# Patient Record
Sex: Female | Born: 1955 | Race: Black or African American | Hispanic: No | Smoking: Former smoker
Health system: Southern US, Community
[De-identification: ages and names within clinical notes are randomized; demographics above are authoritative.]

## PROBLEM LIST (undated history)

## (undated) DIAGNOSIS — F329 Major depressive disorder, single episode, unspecified: Secondary | ICD-10-CM

## (undated) DIAGNOSIS — F32A Depression, unspecified: Secondary | ICD-10-CM

## (undated) DIAGNOSIS — K311 Adult hypertrophic pyloric stenosis: Secondary | ICD-10-CM

## (undated) DIAGNOSIS — E785 Hyperlipidemia, unspecified: Secondary | ICD-10-CM

## (undated) DIAGNOSIS — K219 Gastro-esophageal reflux disease without esophagitis: Secondary | ICD-10-CM

## (undated) DIAGNOSIS — M5030 Other cervical disc degeneration, unspecified cervical region: Secondary | ICD-10-CM

## (undated) DIAGNOSIS — Z720 Tobacco use: Secondary | ICD-10-CM

## (undated) DIAGNOSIS — I10 Essential (primary) hypertension: Secondary | ICD-10-CM

## (undated) DIAGNOSIS — IMO0002 Reserved for concepts with insufficient information to code with codable children: Secondary | ICD-10-CM

## (undated) DIAGNOSIS — Z974 Presence of external hearing-aid: Secondary | ICD-10-CM

## (undated) HISTORY — DX: Major depressive disorder, single episode, unspecified: F32.9

## (undated) HISTORY — DX: Reserved for concepts with insufficient information to code with codable children: IMO0002

## (undated) HISTORY — DX: Tobacco use: Z72.0

## (undated) HISTORY — DX: Essential (primary) hypertension: I10

## (undated) HISTORY — DX: Presence of external hearing-aid: Z97.4

## (undated) HISTORY — DX: Adult hypertrophic pyloric stenosis: K31.1

## (undated) HISTORY — DX: Depression, unspecified: F32.A

## (undated) HISTORY — DX: Gastro-esophageal reflux disease without esophagitis: K21.9

## (undated) HISTORY — PX: LUMBAR LAMINECTOMY: SHX95

## (undated) HISTORY — DX: Other cervical disc degeneration, unspecified cervical region: M50.30

## (undated) HISTORY — DX: Hyperlipidemia, unspecified: E78.5

---

## 1991-04-25 HISTORY — PX: LUMBAR LAMINECTOMY: SHX95

## 1998-06-14 ENCOUNTER — Encounter: Payer: Self-pay | Admitting: Family Medicine

## 1998-06-14 ENCOUNTER — Ambulatory Visit (HOSPITAL_COMMUNITY): Admission: RE | Admit: 1998-06-14 | Discharge: 1998-06-14 | Payer: Self-pay | Admitting: Family Medicine

## 1998-09-16 ENCOUNTER — Emergency Department (HOSPITAL_COMMUNITY): Admission: EM | Admit: 1998-09-16 | Discharge: 1998-09-16 | Payer: Self-pay | Admitting: Emergency Medicine

## 1998-09-16 ENCOUNTER — Encounter: Payer: Self-pay | Admitting: Emergency Medicine

## 1999-05-27 ENCOUNTER — Emergency Department (HOSPITAL_COMMUNITY): Admission: EM | Admit: 1999-05-27 | Discharge: 1999-05-27 | Payer: Self-pay | Admitting: Emergency Medicine

## 1999-08-19 ENCOUNTER — Ambulatory Visit (HOSPITAL_COMMUNITY): Admission: RE | Admit: 1999-08-19 | Discharge: 1999-08-19 | Payer: Self-pay | Admitting: Family Medicine

## 2000-08-23 ENCOUNTER — Emergency Department (HOSPITAL_COMMUNITY): Admission: EM | Admit: 2000-08-23 | Discharge: 2000-08-23 | Payer: Self-pay | Admitting: Emergency Medicine

## 2000-08-24 ENCOUNTER — Emergency Department (HOSPITAL_COMMUNITY): Admission: EM | Admit: 2000-08-24 | Discharge: 2000-08-24 | Payer: Self-pay | Admitting: Emergency Medicine

## 2000-08-24 ENCOUNTER — Encounter: Payer: Self-pay | Admitting: Emergency Medicine

## 2003-02-04 ENCOUNTER — Emergency Department (HOSPITAL_COMMUNITY): Admission: EM | Admit: 2003-02-04 | Discharge: 2003-02-04 | Payer: Self-pay | Admitting: Emergency Medicine

## 2003-02-20 ENCOUNTER — Encounter: Admission: RE | Admit: 2003-02-20 | Discharge: 2003-02-20 | Payer: Self-pay | Admitting: Internal Medicine

## 2003-02-26 ENCOUNTER — Encounter: Admission: RE | Admit: 2003-02-26 | Discharge: 2003-02-26 | Payer: Self-pay | Admitting: Internal Medicine

## 2003-03-02 ENCOUNTER — Ambulatory Visit (HOSPITAL_COMMUNITY): Admission: RE | Admit: 2003-03-02 | Discharge: 2003-03-02 | Payer: Self-pay | Admitting: Internal Medicine

## 2003-03-12 ENCOUNTER — Encounter: Admission: RE | Admit: 2003-03-12 | Discharge: 2003-03-12 | Payer: Self-pay | Admitting: Internal Medicine

## 2003-04-29 ENCOUNTER — Encounter: Admission: RE | Admit: 2003-04-29 | Discharge: 2003-04-29 | Payer: Self-pay | Admitting: Internal Medicine

## 2003-05-04 ENCOUNTER — Emergency Department (HOSPITAL_COMMUNITY): Admission: EM | Admit: 2003-05-04 | Discharge: 2003-05-04 | Payer: Self-pay | Admitting: Emergency Medicine

## 2003-05-07 ENCOUNTER — Encounter: Admission: RE | Admit: 2003-05-07 | Discharge: 2003-05-07 | Payer: Self-pay | Admitting: Internal Medicine

## 2003-06-02 ENCOUNTER — Encounter: Admission: RE | Admit: 2003-06-02 | Discharge: 2003-06-02 | Payer: Self-pay | Admitting: Orthopedic Surgery

## 2003-06-17 ENCOUNTER — Encounter: Admission: RE | Admit: 2003-06-17 | Discharge: 2003-06-17 | Payer: Self-pay | Admitting: Orthopedic Surgery

## 2003-06-17 ENCOUNTER — Encounter: Admission: RE | Admit: 2003-06-17 | Discharge: 2003-06-17 | Payer: Self-pay | Admitting: Internal Medicine

## 2003-07-02 ENCOUNTER — Encounter: Admission: RE | Admit: 2003-07-02 | Discharge: 2003-07-02 | Payer: Self-pay | Admitting: Orthopedic Surgery

## 2003-07-08 ENCOUNTER — Encounter: Admission: RE | Admit: 2003-07-08 | Discharge: 2003-07-08 | Payer: Self-pay | Admitting: Internal Medicine

## 2003-07-14 ENCOUNTER — Encounter: Admission: RE | Admit: 2003-07-14 | Discharge: 2003-07-14 | Payer: Self-pay | Admitting: Internal Medicine

## 2003-07-14 ENCOUNTER — Ambulatory Visit (HOSPITAL_COMMUNITY): Admission: RE | Admit: 2003-07-14 | Discharge: 2003-07-14 | Payer: Self-pay | Admitting: Internal Medicine

## 2003-07-24 ENCOUNTER — Ambulatory Visit (HOSPITAL_COMMUNITY): Admission: RE | Admit: 2003-07-24 | Discharge: 2003-07-24 | Payer: Self-pay | Admitting: Internal Medicine

## 2003-07-29 ENCOUNTER — Encounter: Admission: RE | Admit: 2003-07-29 | Discharge: 2003-07-29 | Payer: Self-pay | Admitting: Orthopedic Surgery

## 2003-08-13 ENCOUNTER — Encounter: Admission: RE | Admit: 2003-08-13 | Discharge: 2003-08-13 | Payer: Self-pay | Admitting: Internal Medicine

## 2003-10-05 ENCOUNTER — Ambulatory Visit (HOSPITAL_COMMUNITY): Admission: RE | Admit: 2003-10-05 | Discharge: 2003-10-05 | Payer: Self-pay | Admitting: *Deleted

## 2004-01-28 ENCOUNTER — Ambulatory Visit: Payer: Self-pay | Admitting: Internal Medicine

## 2004-02-01 ENCOUNTER — Ambulatory Visit: Payer: Self-pay | Admitting: Internal Medicine

## 2004-02-05 ENCOUNTER — Ambulatory Visit (HOSPITAL_COMMUNITY): Admission: RE | Admit: 2004-02-05 | Discharge: 2004-02-05 | Payer: Self-pay | Admitting: Family Medicine

## 2004-02-08 ENCOUNTER — Ambulatory Visit: Payer: Self-pay | Admitting: Internal Medicine

## 2004-02-17 ENCOUNTER — Ambulatory Visit: Payer: Self-pay | Admitting: Internal Medicine

## 2004-03-24 ENCOUNTER — Encounter: Admission: RE | Admit: 2004-03-24 | Discharge: 2004-06-09 | Payer: Self-pay | Admitting: Psychiatry

## 2004-04-14 ENCOUNTER — Encounter: Admission: RE | Admit: 2004-04-14 | Discharge: 2004-07-13 | Payer: Self-pay | Admitting: Psychiatry

## 2004-05-09 ENCOUNTER — Emergency Department (HOSPITAL_COMMUNITY): Admission: EM | Admit: 2004-05-09 | Discharge: 2004-05-09 | Payer: Self-pay | Admitting: Emergency Medicine

## 2004-05-12 ENCOUNTER — Emergency Department (HOSPITAL_COMMUNITY): Admission: EM | Admit: 2004-05-12 | Discharge: 2004-05-12 | Payer: Self-pay | Admitting: Emergency Medicine

## 2004-05-20 ENCOUNTER — Ambulatory Visit: Payer: Self-pay | Admitting: Internal Medicine

## 2004-06-22 ENCOUNTER — Ambulatory Visit: Payer: Self-pay | Admitting: Internal Medicine

## 2004-12-28 ENCOUNTER — Ambulatory Visit: Payer: Self-pay | Admitting: Internal Medicine

## 2005-02-20 ENCOUNTER — Ambulatory Visit: Payer: Self-pay | Admitting: Hospitalist

## 2005-03-07 ENCOUNTER — Ambulatory Visit: Payer: Self-pay | Admitting: Internal Medicine

## 2006-02-12 ENCOUNTER — Ambulatory Visit: Payer: Self-pay | Admitting: Internal Medicine

## 2006-02-12 LAB — CONVERTED CEMR LAB
BUN: 12 mg/dL (ref 6–23)
CO2: 21 meq/L (ref 19–32)
Calcium: 9.8 mg/dL (ref 8.4–10.5)
Chloride: 106 meq/L (ref 96–112)
Creatinine, Ser: 0.83 mg/dL (ref 0.40–1.20)
Glucose, Bld: 72 mg/dL (ref 70–99)
Potassium: 4.2 meq/L (ref 3.5–5.3)
Sodium: 142 meq/L (ref 135–145)

## 2006-02-26 ENCOUNTER — Ambulatory Visit: Payer: Self-pay | Admitting: Hospitalist

## 2006-03-02 ENCOUNTER — Ambulatory Visit: Payer: Self-pay | Admitting: Internal Medicine

## 2006-03-02 ENCOUNTER — Encounter (INDEPENDENT_AMBULATORY_CARE_PROVIDER_SITE_OTHER): Payer: Self-pay | Admitting: *Deleted

## 2006-03-02 LAB — CONVERTED CEMR LAB
Cholesterol: 197 mg/dL (ref 0–200)
HDL: 26 mg/dL — ABNORMAL LOW (ref >39)
LDL Cholesterol: 139 mg/dL — ABNORMAL HIGH (ref 0–99)
Total CHOL/HDL Ratio: 7.6
Triglycerides: 162 mg/dL — ABNORMAL HIGH (ref <150)
VLDL: 32 mg/dL (ref 0–40)

## 2006-03-22 ENCOUNTER — Encounter (INDEPENDENT_AMBULATORY_CARE_PROVIDER_SITE_OTHER): Payer: Self-pay | Admitting: *Deleted

## 2006-03-22 ENCOUNTER — Ambulatory Visit: Payer: Self-pay | Admitting: Internal Medicine

## 2006-03-22 LAB — CONVERTED CEMR LAB
ALT: 12 units/L (ref 0–35)
AST: 14 units/L (ref 0–37)
Albumin: 4.6 g/dL (ref 3.5–5.2)
Alkaline Phosphatase: 90 units/L (ref 39–117)
BUN: 13 mg/dL (ref 6–23)
CO2: 26 meq/L (ref 19–32)
Calcium: 9.3 mg/dL (ref 8.4–10.5)
Chloride: 100 meq/L (ref 96–112)
Cholesterol: 234 mg/dL — ABNORMAL HIGH (ref 0–200)
Creatinine, Ser: 1.13 mg/dL (ref 0.40–1.20)
Glucose, Bld: 86 mg/dL (ref 70–99)
HDL: 28 mg/dL — ABNORMAL LOW (ref >39)
LDL Cholesterol: 142 mg/dL — ABNORMAL HIGH (ref 0–99)
Potassium: 4.1 meq/L (ref 3.5–5.3)
Sodium: 139 meq/L (ref 135–145)
Total Bilirubin: 0.2 mg/dL — ABNORMAL LOW (ref 0.3–1.2)
Total CHOL/HDL Ratio: 8.4
Total Protein: 7.5 g/dL (ref 6.0–8.3)
Triglycerides: 321 mg/dL — ABNORMAL HIGH (ref <150)
VLDL: 64 mg/dL — ABNORMAL HIGH (ref 0–40)

## 2006-04-10 ENCOUNTER — Encounter: Admission: RE | Admit: 2006-04-10 | Discharge: 2006-05-08 | Payer: Self-pay | Admitting: *Deleted

## 2006-04-12 ENCOUNTER — Ambulatory Visit (HOSPITAL_COMMUNITY): Admission: RE | Admit: 2006-04-12 | Discharge: 2006-04-12 | Payer: Self-pay | Admitting: Internal Medicine

## 2006-04-18 ENCOUNTER — Emergency Department (HOSPITAL_COMMUNITY): Admission: EM | Admit: 2006-04-18 | Discharge: 2006-04-18 | Payer: Self-pay | Admitting: Emergency Medicine

## 2006-04-22 ENCOUNTER — Emergency Department (HOSPITAL_COMMUNITY): Admission: EM | Admit: 2006-04-22 | Discharge: 2006-04-22 | Payer: Self-pay | Admitting: Emergency Medicine

## 2006-05-04 DIAGNOSIS — L219 Seborrheic dermatitis, unspecified: Secondary | ICD-10-CM | POA: Insufficient documentation

## 2006-05-04 DIAGNOSIS — M545 Low back pain, unspecified: Secondary | ICD-10-CM | POA: Insufficient documentation

## 2006-05-04 DIAGNOSIS — E785 Hyperlipidemia, unspecified: Secondary | ICD-10-CM | POA: Insufficient documentation

## 2006-05-04 DIAGNOSIS — IMO0002 Reserved for concepts with insufficient information to code with codable children: Secondary | ICD-10-CM | POA: Insufficient documentation

## 2006-05-04 DIAGNOSIS — I10 Essential (primary) hypertension: Secondary | ICD-10-CM | POA: Insufficient documentation

## 2006-05-04 DIAGNOSIS — F17200 Nicotine dependence, unspecified, uncomplicated: Secondary | ICD-10-CM | POA: Insufficient documentation

## 2006-05-04 DIAGNOSIS — F329 Major depressive disorder, single episode, unspecified: Secondary | ICD-10-CM

## 2006-05-04 DIAGNOSIS — G8929 Other chronic pain: Secondary | ICD-10-CM | POA: Insufficient documentation

## 2006-05-04 DIAGNOSIS — K219 Gastro-esophageal reflux disease without esophagitis: Secondary | ICD-10-CM | POA: Insufficient documentation

## 2006-05-04 DIAGNOSIS — F32A Depression, unspecified: Secondary | ICD-10-CM | POA: Insufficient documentation

## 2006-05-09 ENCOUNTER — Encounter: Admission: RE | Admit: 2006-05-09 | Discharge: 2006-07-20 | Payer: Self-pay | Admitting: *Deleted

## 2006-06-20 ENCOUNTER — Encounter (INDEPENDENT_AMBULATORY_CARE_PROVIDER_SITE_OTHER): Payer: Self-pay | Admitting: *Deleted

## 2006-06-20 ENCOUNTER — Ambulatory Visit: Payer: Self-pay | Admitting: Internal Medicine

## 2006-06-20 DIAGNOSIS — H6091 Unspecified otitis externa, right ear: Secondary | ICD-10-CM | POA: Insufficient documentation

## 2006-06-20 DIAGNOSIS — H6090 Unspecified otitis externa, unspecified ear: Secondary | ICD-10-CM

## 2006-06-20 HISTORY — DX: Unspecified otitis externa, right ear: H60.91

## 2006-06-20 LAB — CONVERTED CEMR LAB: Pap Smear: NORMAL

## 2006-06-21 LAB — CONVERTED CEMR LAB
Candida species: POSITIVE — AB
Gardnerella vaginalis: NEGATIVE
Trichomonal Vaginitis: NEGATIVE

## 2006-06-27 ENCOUNTER — Ambulatory Visit: Payer: Self-pay | Admitting: *Deleted

## 2006-06-27 ENCOUNTER — Encounter (INDEPENDENT_AMBULATORY_CARE_PROVIDER_SITE_OTHER): Payer: Self-pay | Admitting: *Deleted

## 2006-07-04 ENCOUNTER — Ambulatory Visit: Payer: Self-pay | Admitting: *Deleted

## 2006-07-11 LAB — CONVERTED CEMR LAB
ALT: 12 units/L (ref 0–35)
AST: 14 units/L (ref 0–37)
Albumin: 4.5 g/dL (ref 3.5–5.2)
Alkaline Phosphatase: 73 units/L (ref 39–117)
BUN: 12 mg/dL (ref 6–23)
CO2: 24 meq/L (ref 19–32)
Calcium: 9.7 mg/dL (ref 8.4–10.5)
Chloride: 107 meq/L (ref 96–112)
Cholesterol: 217 mg/dL — ABNORMAL HIGH (ref 0–200)
Creatinine, Ser: 0.8 mg/dL (ref 0.40–1.20)
Glucose, Bld: 90 mg/dL (ref 70–99)
HDL: 37 mg/dL — ABNORMAL LOW (ref >39)
Helicobacter Pylori Antibody-IgG: >8 — ABNORMAL HIGH
LDL Cholesterol: 158 mg/dL — ABNORMAL HIGH (ref 0–99)
Potassium: 4 meq/L (ref 3.5–5.3)
Sodium: 140 meq/L (ref 135–145)
Total Bilirubin: 0.3 mg/dL (ref 0.3–1.2)
Total CHOL/HDL Ratio: 5.9
Total Protein: 7.7 g/dL (ref 6.0–8.3)
Triglycerides: 108 mg/dL (ref <150)
VLDL: 22 mg/dL (ref 0–40)

## 2006-08-29 ENCOUNTER — Encounter: Payer: Self-pay | Admitting: Licensed Clinical Social Worker

## 2006-09-04 ENCOUNTER — Encounter (INDEPENDENT_AMBULATORY_CARE_PROVIDER_SITE_OTHER): Payer: Self-pay | Admitting: *Deleted

## 2006-10-04 ENCOUNTER — Encounter (INDEPENDENT_AMBULATORY_CARE_PROVIDER_SITE_OTHER): Payer: Self-pay | Admitting: *Deleted

## 2006-10-17 ENCOUNTER — Telehealth: Payer: Self-pay | Admitting: *Deleted

## 2006-10-17 ENCOUNTER — Ambulatory Visit: Payer: Self-pay | Admitting: Internal Medicine

## 2006-10-29 ENCOUNTER — Telehealth (INDEPENDENT_AMBULATORY_CARE_PROVIDER_SITE_OTHER): Payer: Self-pay | Admitting: *Deleted

## 2007-01-30 ENCOUNTER — Ambulatory Visit: Payer: Self-pay | Admitting: Infectious Diseases

## 2007-03-25 ENCOUNTER — Telehealth: Payer: Self-pay | Admitting: *Deleted

## 2007-03-26 ENCOUNTER — Telehealth (INDEPENDENT_AMBULATORY_CARE_PROVIDER_SITE_OTHER): Payer: Self-pay | Admitting: *Deleted

## 2007-04-10 ENCOUNTER — Ambulatory Visit (HOSPITAL_COMMUNITY): Admission: RE | Admit: 2007-04-10 | Discharge: 2007-04-10 | Payer: Self-pay | Admitting: Internal Medicine

## 2007-04-23 ENCOUNTER — Encounter: Admission: RE | Admit: 2007-04-23 | Discharge: 2007-04-23 | Payer: Self-pay | Admitting: Internal Medicine

## 2007-04-25 HISTORY — PX: OTHER SURGICAL HISTORY: SHX169

## 2007-04-30 ENCOUNTER — Encounter (INDEPENDENT_AMBULATORY_CARE_PROVIDER_SITE_OTHER): Payer: Self-pay | Admitting: Internal Medicine

## 2007-04-30 ENCOUNTER — Ambulatory Visit: Payer: Self-pay | Admitting: Internal Medicine

## 2007-05-01 LAB — CONVERTED CEMR LAB
ALT: 9 units/L (ref 0–35)
AST: 12 units/L (ref 0–37)
Albumin: 4.6 g/dL (ref 3.5–5.2)
Alkaline Phosphatase: 81 units/L (ref 39–117)
BUN: 10 mg/dL (ref 6–23)
CO2: 21 meq/L (ref 19–32)
Calcium: 9.3 mg/dL (ref 8.4–10.5)
Chloride: 111 meq/L (ref 96–112)
Cholesterol: 224 mg/dL — ABNORMAL HIGH (ref 0–200)
Creatinine, Ser: 0.82 mg/dL (ref 0.40–1.20)
Glucose, Bld: 93 mg/dL (ref 70–99)
HDL: 32 mg/dL — ABNORMAL LOW (ref >39)
LDL Cholesterol: 167 mg/dL — ABNORMAL HIGH (ref 0–99)
Potassium: 4.1 meq/L (ref 3.5–5.3)
Sodium: 142 meq/L (ref 135–145)
Total Bilirubin: 0.3 mg/dL (ref 0.3–1.2)
Total CHOL/HDL Ratio: 7
Total Protein: 7.4 g/dL (ref 6.0–8.3)
Triglycerides: 127 mg/dL (ref <150)
VLDL: 25 mg/dL (ref 0–40)

## 2007-05-06 ENCOUNTER — Inpatient Hospital Stay (HOSPITAL_COMMUNITY): Admission: AD | Admit: 2007-05-06 | Discharge: 2007-05-10 | Payer: Self-pay | Admitting: Hospitalist

## 2007-05-06 ENCOUNTER — Encounter (INDEPENDENT_AMBULATORY_CARE_PROVIDER_SITE_OTHER): Payer: Self-pay | Admitting: *Deleted

## 2007-05-06 ENCOUNTER — Ambulatory Visit: Payer: Self-pay | Admitting: Hospitalist

## 2007-05-06 ENCOUNTER — Ambulatory Visit: Payer: Self-pay | Admitting: Internal Medicine

## 2007-05-06 DIAGNOSIS — I498 Other specified cardiac arrhythmias: Secondary | ICD-10-CM | POA: Insufficient documentation

## 2007-05-09 ENCOUNTER — Encounter (INDEPENDENT_AMBULATORY_CARE_PROVIDER_SITE_OTHER): Payer: Self-pay | Admitting: Gastroenterology

## 2007-05-17 ENCOUNTER — Ambulatory Visit: Payer: Self-pay | Admitting: Infectious Disease

## 2007-05-17 ENCOUNTER — Encounter (INDEPENDENT_AMBULATORY_CARE_PROVIDER_SITE_OTHER): Payer: Self-pay | Admitting: Internal Medicine

## 2007-05-17 DIAGNOSIS — K5900 Constipation, unspecified: Secondary | ICD-10-CM | POA: Insufficient documentation

## 2007-05-20 LAB — CONVERTED CEMR LAB
BUN: 11 mg/dL (ref 6–23)
CO2: 27 meq/L (ref 19–32)
Calcium: 9.5 mg/dL (ref 8.4–10.5)
Chloride: 98 meq/L (ref 96–112)
Creatinine, Ser: 0.95 mg/dL (ref 0.40–1.20)
Glucose, Bld: 86 mg/dL (ref 70–99)
Potassium: 3.5 meq/L (ref 3.5–5.3)
Sodium: 140 meq/L (ref 135–145)

## 2007-05-21 ENCOUNTER — Encounter: Payer: Self-pay | Admitting: Licensed Clinical Social Worker

## 2007-05-24 ENCOUNTER — Encounter (INDEPENDENT_AMBULATORY_CARE_PROVIDER_SITE_OTHER): Payer: Self-pay | Admitting: *Deleted

## 2007-06-03 ENCOUNTER — Encounter (INDEPENDENT_AMBULATORY_CARE_PROVIDER_SITE_OTHER): Payer: Self-pay | Admitting: *Deleted

## 2007-06-04 ENCOUNTER — Encounter (INDEPENDENT_AMBULATORY_CARE_PROVIDER_SITE_OTHER): Payer: Self-pay | Admitting: *Deleted

## 2007-06-06 ENCOUNTER — Encounter: Admission: RE | Admit: 2007-06-06 | Discharge: 2007-06-06 | Payer: Self-pay | Admitting: Gastroenterology

## 2007-07-04 ENCOUNTER — Encounter (INDEPENDENT_AMBULATORY_CARE_PROVIDER_SITE_OTHER): Payer: Self-pay | Admitting: *Deleted

## 2007-07-04 DIAGNOSIS — Q400 Congenital hypertrophic pyloric stenosis: Secondary | ICD-10-CM | POA: Insufficient documentation

## 2007-07-08 ENCOUNTER — Encounter (INDEPENDENT_AMBULATORY_CARE_PROVIDER_SITE_OTHER): Payer: Self-pay | Admitting: *Deleted

## 2008-01-31 ENCOUNTER — Encounter (INDEPENDENT_AMBULATORY_CARE_PROVIDER_SITE_OTHER): Payer: Self-pay | Admitting: *Deleted

## 2008-01-31 ENCOUNTER — Ambulatory Visit: Payer: Self-pay | Admitting: Infectious Disease

## 2008-01-31 LAB — CONVERTED CEMR LAB
ALT: 11 units/L (ref 0–35)
AST: 14 units/L (ref 0–37)
Albumin: 4.6 g/dL (ref 3.5–5.2)
Alkaline Phosphatase: 95 units/L (ref 39–117)
BUN: 7 mg/dL (ref 6–23)
CO2: 23 meq/L (ref 19–32)
Calcium: 10.2 mg/dL (ref 8.4–10.5)
Chloride: 102 meq/L (ref 96–112)
Cholesterol: 143 mg/dL (ref 0–200)
Creatinine, Ser: 0.94 mg/dL (ref 0.40–1.20)
Glucose, Bld: 87 mg/dL (ref 70–99)
HDL: 32 mg/dL — ABNORMAL LOW (ref >39)
LDL Cholesterol: 85 mg/dL (ref 0–99)
Potassium: 3.4 meq/L — ABNORMAL LOW (ref 3.5–5.3)
Sodium: 139 meq/L (ref 135–145)
Total Bilirubin: 0.3 mg/dL (ref 0.3–1.2)
Total CHOL/HDL Ratio: 4.5
Total Protein: 7.7 g/dL (ref 6.0–8.3)
Triglycerides: 130 mg/dL (ref <150)
VLDL: 26 mg/dL (ref 0–40)

## 2008-02-04 ENCOUNTER — Telehealth: Payer: Self-pay | Admitting: *Deleted

## 2008-02-04 ENCOUNTER — Ambulatory Visit (HOSPITAL_COMMUNITY): Admission: RE | Admit: 2008-02-04 | Discharge: 2008-02-04 | Payer: Self-pay | Admitting: Infectious Disease

## 2008-02-07 DIAGNOSIS — M5030 Other cervical disc degeneration, unspecified cervical region: Secondary | ICD-10-CM | POA: Insufficient documentation

## 2008-03-11 ENCOUNTER — Encounter (INDEPENDENT_AMBULATORY_CARE_PROVIDER_SITE_OTHER): Payer: Self-pay | Admitting: Internal Medicine

## 2008-03-11 ENCOUNTER — Ambulatory Visit: Payer: Self-pay | Admitting: Infectious Diseases

## 2008-04-15 ENCOUNTER — Ambulatory Visit: Payer: Self-pay | Admitting: Internal Medicine

## 2008-05-07 ENCOUNTER — Ambulatory Visit (HOSPITAL_COMMUNITY): Admission: RE | Admit: 2008-05-07 | Discharge: 2008-05-07 | Payer: Self-pay | Admitting: Internal Medicine

## 2008-05-14 ENCOUNTER — Encounter (INDEPENDENT_AMBULATORY_CARE_PROVIDER_SITE_OTHER): Payer: Self-pay | Admitting: *Deleted

## 2008-06-02 ENCOUNTER — Encounter (INDEPENDENT_AMBULATORY_CARE_PROVIDER_SITE_OTHER): Payer: Self-pay | Admitting: *Deleted

## 2008-06-22 ENCOUNTER — Telehealth (INDEPENDENT_AMBULATORY_CARE_PROVIDER_SITE_OTHER): Payer: Self-pay | Admitting: *Deleted

## 2008-06-29 ENCOUNTER — Ambulatory Visit: Payer: Self-pay | Admitting: *Deleted

## 2008-06-29 ENCOUNTER — Encounter (INDEPENDENT_AMBULATORY_CARE_PROVIDER_SITE_OTHER): Payer: Self-pay | Admitting: *Deleted

## 2008-06-29 ENCOUNTER — Ambulatory Visit (HOSPITAL_COMMUNITY): Admission: RE | Admit: 2008-06-29 | Discharge: 2008-06-29 | Payer: Self-pay | Admitting: *Deleted

## 2008-08-23 ENCOUNTER — Emergency Department (HOSPITAL_COMMUNITY): Admission: EM | Admit: 2008-08-23 | Discharge: 2008-08-24 | Payer: Self-pay | Admitting: Emergency Medicine

## 2008-08-31 ENCOUNTER — Ambulatory Visit: Payer: Self-pay | Admitting: *Deleted

## 2008-08-31 ENCOUNTER — Encounter (INDEPENDENT_AMBULATORY_CARE_PROVIDER_SITE_OTHER): Payer: Self-pay | Admitting: *Deleted

## 2008-08-31 LAB — CONVERTED CEMR LAB
ALT: 12 units/L (ref 0–35)
AST: 13 units/L (ref 0–37)
Albumin: 4.1 g/dL (ref 3.5–5.2)
Alkaline Phosphatase: 77 units/L (ref 39–117)
BUN: 13 mg/dL (ref 6–23)
CO2: 20 meq/L (ref 19–32)
Calcium: 9.6 mg/dL (ref 8.4–10.5)
Chloride: 106 meq/L (ref 96–112)
Cholesterol: 136 mg/dL (ref 0–200)
Creatinine, Ser: 0.77 mg/dL (ref 0.40–1.20)
GFR calc Af Amer: >60 mL/min (ref >60)
GFR calc non Af Amer: >60 mL/min (ref >60)
Glucose, Bld: 86 mg/dL (ref 70–99)
HDL: 24 mg/dL — ABNORMAL LOW (ref >39)
LDL Cholesterol: 77 mg/dL (ref 0–99)
Potassium: 3.9 meq/L (ref 3.5–5.3)
Sodium: 139 meq/L (ref 135–145)
Total Bilirubin: 0.3 mg/dL (ref 0.3–1.2)
Total CHOL/HDL Ratio: 5.7
Total Protein: 6.9 g/dL (ref 6.0–8.3)
Triglycerides: 177 mg/dL — ABNORMAL HIGH (ref <150)
VLDL: 35 mg/dL (ref 0–40)

## 2008-09-01 ENCOUNTER — Encounter (INDEPENDENT_AMBULATORY_CARE_PROVIDER_SITE_OTHER): Payer: Self-pay | Admitting: *Deleted

## 2008-09-07 ENCOUNTER — Encounter: Payer: Self-pay | Admitting: Licensed Clinical Social Worker

## 2008-09-08 ENCOUNTER — Encounter (INDEPENDENT_AMBULATORY_CARE_PROVIDER_SITE_OTHER): Payer: Self-pay | Admitting: *Deleted

## 2008-09-10 ENCOUNTER — Encounter (INDEPENDENT_AMBULATORY_CARE_PROVIDER_SITE_OTHER): Payer: Self-pay | Admitting: *Deleted

## 2008-09-14 ENCOUNTER — Encounter (INDEPENDENT_AMBULATORY_CARE_PROVIDER_SITE_OTHER): Payer: Self-pay | Admitting: *Deleted

## 2008-09-22 ENCOUNTER — Encounter (INDEPENDENT_AMBULATORY_CARE_PROVIDER_SITE_OTHER): Payer: Self-pay | Admitting: *Deleted

## 2008-10-05 ENCOUNTER — Encounter (INDEPENDENT_AMBULATORY_CARE_PROVIDER_SITE_OTHER): Payer: Self-pay | Admitting: Internal Medicine

## 2008-10-21 ENCOUNTER — Encounter (INDEPENDENT_AMBULATORY_CARE_PROVIDER_SITE_OTHER): Payer: Self-pay | Admitting: Internal Medicine

## 2009-01-27 ENCOUNTER — Telehealth (INDEPENDENT_AMBULATORY_CARE_PROVIDER_SITE_OTHER): Payer: Self-pay | Admitting: Internal Medicine

## 2009-01-27 ENCOUNTER — Ambulatory Visit: Payer: Self-pay | Admitting: Internal Medicine

## 2009-02-23 ENCOUNTER — Telehealth (INDEPENDENT_AMBULATORY_CARE_PROVIDER_SITE_OTHER): Payer: Self-pay | Admitting: Internal Medicine

## 2009-03-25 ENCOUNTER — Ambulatory Visit: Payer: Self-pay | Admitting: Internal Medicine

## 2009-03-30 ENCOUNTER — Telehealth (INDEPENDENT_AMBULATORY_CARE_PROVIDER_SITE_OTHER): Payer: Self-pay | Admitting: Internal Medicine

## 2009-03-30 ENCOUNTER — Encounter: Payer: Self-pay | Admitting: Internal Medicine

## 2009-04-05 ENCOUNTER — Telehealth (INDEPENDENT_AMBULATORY_CARE_PROVIDER_SITE_OTHER): Payer: Self-pay | Admitting: Internal Medicine

## 2009-04-14 ENCOUNTER — Encounter (INDEPENDENT_AMBULATORY_CARE_PROVIDER_SITE_OTHER): Payer: Self-pay | Admitting: Internal Medicine

## 2009-04-30 ENCOUNTER — Encounter (INDEPENDENT_AMBULATORY_CARE_PROVIDER_SITE_OTHER): Payer: Self-pay | Admitting: Internal Medicine

## 2009-05-17 ENCOUNTER — Telehealth (INDEPENDENT_AMBULATORY_CARE_PROVIDER_SITE_OTHER): Payer: Self-pay | Admitting: Internal Medicine

## 2009-05-26 ENCOUNTER — Telehealth (INDEPENDENT_AMBULATORY_CARE_PROVIDER_SITE_OTHER): Payer: Self-pay | Admitting: Internal Medicine

## 2009-05-31 ENCOUNTER — Ambulatory Visit (HOSPITAL_COMMUNITY): Admission: RE | Admit: 2009-05-31 | Discharge: 2009-05-31 | Payer: Self-pay | Admitting: Internal Medicine

## 2009-05-31 LAB — HM MAMMOGRAPHY

## 2009-07-23 ENCOUNTER — Telehealth (INDEPENDENT_AMBULATORY_CARE_PROVIDER_SITE_OTHER): Payer: Self-pay | Admitting: Internal Medicine

## 2009-09-02 ENCOUNTER — Encounter (INDEPENDENT_AMBULATORY_CARE_PROVIDER_SITE_OTHER): Payer: Self-pay | Admitting: Internal Medicine

## 2009-09-06 ENCOUNTER — Ambulatory Visit: Payer: Self-pay | Admitting: Internal Medicine

## 2009-09-06 LAB — CONVERTED CEMR LAB
ALT: 18 units/L (ref 0–35)
AST: 14 units/L (ref 0–37)
Albumin: 4.5 g/dL (ref 3.5–5.2)
Alkaline Phosphatase: 81 units/L (ref 39–117)
BUN: 9 mg/dL (ref 6–23)
CO2: 29 meq/L (ref 19–32)
Calcium: 9.9 mg/dL (ref 8.4–10.5)
Chloride: 103 meq/L (ref 96–112)
Cholesterol: 164 mg/dL (ref 0–200)
Creatinine, Ser: 0.96 mg/dL (ref 0.40–1.20)
Glucose, Bld: 90 mg/dL (ref 70–99)
HDL: 33 mg/dL — ABNORMAL LOW (ref >39)
LDL Cholesterol: 104 mg/dL — ABNORMAL HIGH (ref 0–99)
Potassium: 3.6 meq/L (ref 3.5–5.3)
Sodium: 140 meq/L (ref 135–145)
Total Bilirubin: 0.3 mg/dL (ref 0.3–1.2)
Total CHOL/HDL Ratio: 5
Total Protein: 7.4 g/dL (ref 6.0–8.3)
Triglycerides: 136 mg/dL (ref <150)
VLDL: 27 mg/dL (ref 0–40)

## 2009-09-17 ENCOUNTER — Telehealth (INDEPENDENT_AMBULATORY_CARE_PROVIDER_SITE_OTHER): Payer: Self-pay | Admitting: Internal Medicine

## 2009-09-17 ENCOUNTER — Encounter: Payer: Self-pay | Admitting: Licensed Clinical Social Worker

## 2009-09-21 ENCOUNTER — Telehealth: Payer: Self-pay | Admitting: Licensed Clinical Social Worker

## 2009-09-28 ENCOUNTER — Ambulatory Visit: Payer: Self-pay | Admitting: Internal Medicine

## 2009-10-05 ENCOUNTER — Telehealth: Payer: Self-pay | Admitting: Licensed Clinical Social Worker

## 2009-10-14 ENCOUNTER — Telehealth: Payer: Self-pay | Admitting: Licensed Clinical Social Worker

## 2009-10-29 ENCOUNTER — Encounter: Payer: Self-pay | Admitting: Internal Medicine

## 2009-11-16 ENCOUNTER — Telehealth: Payer: Self-pay | Admitting: Internal Medicine

## 2010-01-10 ENCOUNTER — Telehealth: Payer: Self-pay | Admitting: Internal Medicine

## 2010-01-31 ENCOUNTER — Ambulatory Visit: Payer: Self-pay | Admitting: Internal Medicine

## 2010-02-22 ENCOUNTER — Telehealth (INDEPENDENT_AMBULATORY_CARE_PROVIDER_SITE_OTHER): Payer: Self-pay | Admitting: *Deleted

## 2010-04-27 ENCOUNTER — Encounter: Payer: Self-pay | Admitting: Internal Medicine

## 2010-05-12 ENCOUNTER — Other Ambulatory Visit (HOSPITAL_COMMUNITY): Payer: Self-pay | Admitting: Internal Medicine

## 2010-05-12 DIAGNOSIS — Z0000 Encounter for general adult medical examination without abnormal findings: Secondary | ICD-10-CM

## 2010-05-12 DIAGNOSIS — Z1231 Encounter for screening mammogram for malignant neoplasm of breast: Secondary | ICD-10-CM

## 2010-05-14 ENCOUNTER — Encounter: Payer: Self-pay | Admitting: Internal Medicine

## 2010-05-15 ENCOUNTER — Encounter: Payer: Self-pay | Admitting: Internal Medicine

## 2010-05-15 ENCOUNTER — Encounter: Payer: Self-pay | Admitting: Neurology

## 2010-05-23 ENCOUNTER — Ambulatory Visit: Admission: RE | Admit: 2010-05-23 | Discharge: 2010-05-23 | Payer: Self-pay | Source: Home / Self Care

## 2010-05-23 DIAGNOSIS — R059 Cough, unspecified: Secondary | ICD-10-CM | POA: Insufficient documentation

## 2010-05-23 DIAGNOSIS — R05 Cough: Secondary | ICD-10-CM | POA: Insufficient documentation

## 2010-05-23 LAB — CONVERTED CEMR LAB
ALT: 10 units/L (ref 0–35)
AST: 14 units/L (ref 0–37)
Albumin: 4.5 g/dL (ref 3.5–5.2)
Alkaline Phosphatase: 76 units/L (ref 39–117)
BUN: 7 mg/dL (ref 6–23)
CO2: 23 meq/L (ref 19–32)
Calcium: 10.3 mg/dL (ref 8.4–10.5)
Chloride: 107 meq/L (ref 96–112)
Cholesterol: 181 mg/dL (ref 0–200)
Creatinine, Ser: 0.88 mg/dL (ref 0.40–1.20)
Glucose, Bld: 79 mg/dL (ref 70–99)
HDL: 35 mg/dL — ABNORMAL LOW (ref >39)
LDL Cholesterol: 111 mg/dL — ABNORMAL HIGH (ref 0–99)
Potassium: 3.9 meq/L (ref 3.5–5.3)
Sodium: 142 meq/L (ref 135–145)
Total Bilirubin: 0.4 mg/dL (ref 0.3–1.2)
Total CHOL/HDL Ratio: 5.2
Total Protein: 7.6 g/dL (ref 6.0–8.3)
Triglycerides: 177 mg/dL — ABNORMAL HIGH (ref <150)
VLDL: 35 mg/dL (ref 0–40)

## 2010-05-24 NOTE — Consult Note (Signed)
Summary: [facility]  [facility]   Imported By: Margie Billet 11/01/2009 10:56:36  _____________________________________________________________________  External Attachment:    Type:   Image     Comment:   External Document

## 2010-05-24 NOTE — Progress Notes (Signed)
Summary: refill/gg  Phone Note Refill Request  on April 05, 2009 12:46 PM  pt request Rx for miralax, she has been on this in the past.  Initial call taken by: Merrie Roof RN,  April 05, 2009 12:46 PM  Follow-up for Phone Call        refill signed. please call in. thanks.    New/Updated Medications: MIRALAX  POWD (POLYETHYLENE GLYCOL 3350) Take 17g daily in liquid for constipation. Prescriptions: MIRALAX  POWD (POLYETHYLENE GLYCOL 3350) Take 17g daily in liquid for constipation.  #1 mo supply x 6   Entered and Authorized by:   Elby Showers MD   Signed by:   Elby Showers MD on 04/07/2009   Method used:   Telephoned to ...       [facility] (retail)       [address]       [place], [place]  [address]       Ph: [phone]       Fax: [phone]   RxID:   [id number]

## 2010-05-24 NOTE — Progress Notes (Signed)
Summary: Refill/gh  Phone Note Refill Request Message from:  Fax from Pharmacy on February 22, 2010 3:12 PM  Refills Requested: Medication #1:  LIPITOR 40 MG  TABS Take one tablet daily.  Medication #2:  OMEPRAZOLE 20 MG CPDR Take 1 tablet by mouth two times a day  Medication #3:  MAXZIDE-25 37.5-25 MG TABS Take 1 tablet by mouth once a day Last office visit 09/28/2009.  Last labs were 09/06/2009.   Method Requested: Electronic Initial call taken by: Angelina Ok RN,  February 22, 2010 3:13 PM    Prescriptions: OMEPRAZOLE 20 MG CPDR (OMEPRAZOLE) Take 1 tablet by mouth two times a day  #60 x 11   Entered and Authorized by:   Zoila Shutter MD   Signed by:   Zoila Shutter MD on 02/22/2010   Method used:   Faxed to ...       [facility] (retail)       [address]       [place], [place]  [address]       Ph: [phone]       Fax: [phone]   RxID:   [id number] LIPITOR 40 MG  TABS (ATORVASTATIN CALCIUM) Take one tablet daily.  #32 x 11   Entered and Authorized by:   Zoila Shutter MD   Signed by:   Zoila Shutter MD on 02/22/2010   Method used:   Faxed to ...       [facility] (retail)       [address]       [place], [place]  [address]       Ph: [phone]       Fax: [phone]   RxID:   [id number] MAXZIDE-25 37.5-25 MG TABS (TRIAMTERENE-HCTZ) Take 1 tablet by mouth once a day  #30 x 11   Entered and Authorized by:   Zoila Shutter MD   Signed by:   Zoila Shutter MD on 02/22/2010   Method used:   Faxed to ...       [facility] (retail)       [address]       [place], [place]  [address]       Ph: [phone]       Fax: [phone]   RxID:   [id number]

## 2010-05-24 NOTE — Miscellaneous (Signed)
Summary: [facility]   Imported By: Margie Billet 09/21/2009 14:27:27  _____________________________________________________________________  External Attachment:    Type:   Image     Comment:   External Document

## 2010-05-24 NOTE — Miscellaneous (Signed)
  Clinical Lists Changes  Medications: Rx of MAXZIDE-25 37.5-25 MG TABS (TRIAMTERENE-HCTZ) Take 1 tablet by mouth once a day;  #30 x 11;  Signed;  Entered by: Hartley Barefoot MD;  Authorized by: Hartley Barefoot MD;  Method used: Print then Give to Patient Rx of LIPITOR 40 MG  TABS (ATORVASTATIN CALCIUM) Take one tablet daily.;  #32 x 11;  Signed;  Entered by: Hartley Barefoot MD;  Authorized by: Hartley Barefoot MD;  Method used: Print then Give to Patient Rx of LOCOID 0.1 % CREA (HYDROCORTISONE BUTYRATE) Apply to face two times a day;  #45g x 1;  Signed;  Entered by: Hartley Barefoot MD;  Authorized by: Hartley Barefoot MD;  Method used: Print then Give to Patient Rx of OMEPRAZOLE 20 MG CPDR (OMEPRAZOLE) Take 1 tablet by mouth two times a day;  #60 x 11;  Signed;  Entered by: Hartley Barefoot MD;  Authorized by: Hartley Barefoot MD;  Method used: Print then Give to Patient Rx of FLONASE 50 MCG/ACT SUSP (FLUTICASONE PROPIONATE) Two sprays per nostril daily;  #1 x 2;  Signed;  Entered by: Hartley Barefoot MD;  Authorized by: Hartley Barefoot MD;  Method used: Print then Give to Patient Rx of BACTRIM DS 800-160 MG TABS (SULFAMETHOXAZOLE-TRIMETHOPRIM) Take 1 tablet twice a day for 7 days.;  #14 x 0;  Signed;  Entered by: Hartley Barefoot MD;  Authorized by: Hartley Barefoot MD;  Method used: Print then Give to Patient    Prescriptions: BACTRIM DS 800-160 MG TABS (SULFAMETHOXAZOLE-TRIMETHOPRIM) Take 1 tablet twice a day for 7 days.  #14 x 0   Entered and Authorized by:   Hartley Barefoot MD   Signed by:   Hartley Barefoot MD on 03/30/2009   Method used:   Print then Give to Patient   RxID:   [id number] FLONASE 50 MCG/ACT SUSP (FLUTICASONE PROPIONATE) Two sprays per nostril daily  #1 x 2   Entered and Authorized by:   Hartley Barefoot MD   Signed by:   Hartley Barefoot MD on 03/30/2009   Method used:   Print then Give to Patient   RxID:   [id number] OMEPRAZOLE 20 MG CPDR (OMEPRAZOLE) Take 1 tablet by  mouth two times a day  #60 x 11   Entered and Authorized by:   Hartley Barefoot MD   Signed by:   Hartley Barefoot MD on 03/30/2009   Method used:   Print then Give to Patient   RxID:   [id number] LOCOID 0.1 % CREA (HYDROCORTISONE BUTYRATE) Apply to face two times a day  #45g x 1   Entered and Authorized by:   Hartley Barefoot MD   Signed by:   Hartley Barefoot MD on 03/30/2009   Method used:   Print then Give to Patient   RxID:   [id number] LIPITOR 40 MG  TABS (ATORVASTATIN CALCIUM) Take one tablet daily.  #32 x 11   Entered and Authorized by:   Hartley Barefoot MD   Signed by:   Hartley Barefoot MD on 03/30/2009   Method used:   Print then Give to Patient   RxID:   [id number] MAXZIDE-25 37.5-25 MG TABS (TRIAMTERENE-HCTZ) Take 1 tablet by mouth once a day  #30 x 11   Entered and Authorized by:   Hartley Barefoot MD   Signed by:   Hartley Barefoot MD on 03/30/2009   Method used:   Print then Give to Patient   RxID:   [id number]

## 2010-05-24 NOTE — Progress Notes (Signed)
Summary: phone/gg  Phone Note Call from Patient   Summary of Call: Pt called and [facility].  Please fax request in. Initial call taken by: Merrie Roof RN,  May 17, 2009 12:06 PM  Follow-up for Phone Call        Order is in.  Can you help make this appointment?  New Problems: OTHER SCREENING MAMMOGRAM (ICD-V76.12)   New Problems: OTHER SCREENING MAMMOGRAM (ICD-V76.12)

## 2010-05-24 NOTE — Miscellaneous (Signed)
Summary: PCS Form  PCS Form   Imported By: Florinda Marker 10/11/2006 09:04:54  _____________________________________________________________________  External Attachment:    Type:   Image     Comment:   External Document

## 2010-05-24 NOTE — Assessment & Plan Note (Signed)
Summary: cold, green mucous/pcp-charron/ hla   Vital Signs:  Patient Profile:   55 Years Old Female Height:     65 inches (165.10 cm) O2 Sat:      99 % O2 treatment:    Room Air Temp:     99.1 degrees F (37.28 degrees C) oral Pulse rate:   107 / minute BP sitting:   119 / 87  (left arm)  Pt. in pain?   yes    Location:   left ear, throat,head and throat    Intensity:   8    Type:       pressure  Vitals Entered By: Stanton Kidney Ditzler RN (June 29, 2008 3:14 PM)              Is Patient Diabetic? No Nutritional Status Detail appetite fair  Have you ever been in a relationship where you felt threatened, hurt or afraid?denies   Does patient need assistance? Functional Status Self care Ambulation Wheelchair     PCP:  Olene Craven  Chief Complaint:  H, sore throat, left ear pain, and head congestion and green productive cough past 4 months..  History of Present Illness: Miranda Gaines is a 55 yow with pmh  as noted in the chart who presents today with complaints of three to four months of cough productive for green sputum, nasal congestion, decreased hearing, and sore throat.  Symptoms intermittent and transiently improved with abx and steroid nasal spray.  She denies any  prior symptoms.  TReatment  to date has included amoxicillin, doxycycline, Flonase, and benzonatate. No fevers, chills, N/V/D, sick cntacts.        Prior Medications Reviewed Using: Patient Recall  Updated Prior Medication List: CYCLOBENZAPRINE HCL 10 MG TABS (CYCLOBENZAPRINE HCL) Take 1 tablet by mouth three times a day MAXZIDE-25 37.5-25 MG TABS (TRIAMTERENE-HCTZ) Take 1 tablet by mouth once a day EFFEXOR XR 150 MG CP24 (VENLAFAXINE HCL) Take 1 tablet by mouth once a day NORTRIPTYLINE HCL 50 MG CAPS (NORTRIPTYLINE HCL) Take 1 tablet by mouth at bedtime ELIDEL 1 % CREA (PIMECROLIMUS) Apply to face two times a day LOCOID 0.1 % CREA (HYDROCORTISONE BUTYRATE) Apply to face two times a day LIPITOR 40 MG  TABS  (ATORVASTATIN CALCIUM) Take one tablet daily. MIRALAX  POWD (POLYETHYLENE GLYCOL 3350) Take 1 heaping teaspoon in one glass of liquid once daily ULTRAM ER 100 MG  TB24 (TRAMADOL HCL) Take one tablet 4 times daily. OMEPRAZOLE 20 MG CPDR (OMEPRAZOLE) Take 1 tablet by mouth two times a day NICOTINE 21 MG/24HR PT24 (NICOTINE) Apply once daily for 6 weeks then follow by 14 mg/24h patch. NICOTINE 14 MG/24HR PT24 (NICOTINE) Apply once daily for 6 weeks then follow by 7 mg/24h patch. NICOTINE 7 MG/24HR PT24 (NICOTINE) Apply once daily for 6 weeks then stop. ZYRTEC ALLERGY 10 MG TABS (CETIRIZINE HCL) Take one tablet daily for congestion. CLEOCIN 150 MG CAPS (CLINDAMYCIN HCL) Take three capsules by mouth three times a day for 10 days ALLEGRA 180 MG TABS (FEXOFENADINE HCL) Take 1 tablet by mouth once a day FLONASE 50 MCG/ACT SUSP (FLUTICASONE PROPIONATE) Two sprays per nostril daily  Current Allergies (reviewed today): ! CODEINE ! PENICILLIN ! MORPHINE    Risk Factors: Tobacco use:  current    Cigarettes:  Yes -- 1 pack(s) per day Drug use:  no Alcohol use:  no Exercise:  no Seatbelt use:  100 %  Family History Risk Factors:    Family History of MI in females <  41 years old:  yes  Colonoscopy History:    Date of Last Colonoscopy:  03/22/2006  Mammogram History:    Date of Last Mammogram:  05/07/2008  @  [facility]  PAP Smear History:    Date of Last PAP Smear:  06/20/2006   Review of Systems  General      Denies chills and fever.  CV      Denies chest pain or discomfort, lightheadness, palpitations, and swelling of feet.  Resp      Complains of cough and sputum productive.      Denies chest pain with inspiration and wheezing.  GI      Denies abdominal pain, nausea, and vomiting.   Physical Exam  General:     alert and well-developed.  NAD Ears:     R ear normal and L ear normal.  TM's clear.  L TM is scarred. Nose:     edematous  Mouth:     cobblestoning, no  discharge Neck:     no LAN Lungs:     normal respiratory effort and L base crackles.   Heart:     normal rate, regular rhythm, and no murmur.   Abdomen:     soft, non-tender, and normal bowel sounds.      Impression & Recommendations:  Problem # 1:  ACUTE FRONTAL SINUSITIS (ICD-461.1) Now present for three months.  Pt clearly states it improved transiently with abx and refuses to leave without an antibiotic.  Will try Clindamycin for 450 mg three times a day for two weeks though I doubt this will help.  Will also try antihistamine and nasal steroid spray.    Her updated medication list for this problem includes:    Cleocin 150 Mg Caps (Clindamycin hcl) .Marland Kitchen... Take three capsules by mouth three times a day for 10 days    Flonase 50 Mcg/act Susp (Fluticasone propionate) .Marland Kitchen..Marland Kitchen Two sprays per nostril daily   Complete Medication List: 1)  Cyclobenzaprine Hcl 10 Mg Tabs (Cyclobenzaprine hcl) .... Take 1 tablet by mouth three times a day 2)  Maxzide-25 37.5-25 Mg Tabs (Triamterene-hctz) .... Take 1 tablet by mouth once a day 3)  Effexor Xr 150 Mg Cp24 (Venlafaxine hcl) .... Take 1 tablet by mouth once a day 4)  Nortriptyline Hcl 50 Mg Caps (Nortriptyline hcl) .... Take 1 tablet by mouth at bedtime 5)  Elidel 1 % Crea (Pimecrolimus) .... Apply to face two times a day 6)  Locoid 0.1 % Crea (Hydrocortisone butyrate) .... Apply to face two times a day 7)  Lipitor 40 Mg Tabs (Atorvastatin calcium) .... Take one tablet daily. 8)  Miralax Powd (Polyethylene glycol 3350) .... Take 1 heaping teaspoon in one glass of liquid once daily 9)  Ultram Er 100 Mg Tb24 (Tramadol hcl) .... Take one tablet 4 times daily. 10)  Omeprazole 20 Mg Cpdr (Omeprazole) .... Take 1 tablet by mouth two times a day 11)  Nicotine 21 Mg/24hr Pt24 (Nicotine) .... Apply once daily for 6 weeks then follow by 14 mg/24h patch. 12)  Nicotine 14 Mg/24hr Pt24 (Nicotine) .... Apply once daily for 6 weeks then follow by 7 mg/24h  patch. 13)  Nicotine 7 Mg/24hr Pt24 (Nicotine) .... Apply once daily for 6 weeks then stop. 14)  Zyrtec Allergy 10 Mg Tabs (Cetirizine hcl) .... Take one tablet daily for congestion. 15)  Cleocin 150 Mg Caps (Clindamycin hcl) .... Take three capsules by mouth three times a day for 10 days 16)  Allegra 180  Mg Tabs (Fexofenadine hcl) .... Take 1 tablet by mouth once a day 17)  Flonase 50 Mcg/act Susp (Fluticasone propionate) .... Two sprays per nostril daily  Other Orders: CXR- 2view (CXR)   Patient Instructions: 1)  Please schedule a follow-up appointment in 2 weeks.   Prescriptions: FLONASE 50 MCG/ACT SUSP (FLUTICASONE PROPIONATE) Two sprays per nostril daily  #1 x 2   Entered and Authorized by:   Waldemar Dickens MD   Signed by:   Waldemar Dickens MD on 06/29/2008   Method used:   Print then Give to Patient   RxID:   [id number] ALLEGRA 180 MG TABS (FEXOFENADINE HCL) Take 1 tablet by mouth once a day  #30 x 2   Entered and Authorized by:   Waldemar Dickens MD   Signed by:   Waldemar Dickens MD on 06/29/2008   Method used:   Print then Give to Patient   RxID:   [id number] CLEOCIN 150 MG CAPS (CLINDAMYCIN HCL) Take three capsules by mouth three times a day for 10 days  #90 x 0   Entered and Authorized by:   Waldemar Dickens MD   Signed by:   Waldemar Dickens MD on 06/29/2008   Method used:   Print then Give to Patient   RxID:   [id number]

## 2010-05-24 NOTE — Progress Notes (Signed)
Summary: Soc. 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 Work Emergency planning/management officer of Call: Left message for patient that we had ordered PCS assessment.

## 2010-05-24 NOTE — Progress Notes (Signed)
Summary: order/gg  Phone Note Call from Patient   Caller: Patient Summary of Call: Pt would like a new "commode  seat" hers is 55 years old.  [facility]  Pt # [id number].  If you bring the rx to triage I will fax it. Initial call taken by: Merrie Roof RN,  October 29, 2006 6:26 PM  Follow-up for Phone Call        Will bring Rx to triage. Follow-up by: Olene Craven MD,  November 06, 2006 4:40 PM

## 2010-05-24 NOTE — Progress Notes (Signed)
Summary: med refill/gp  Phone Note Refill Request Message from:  Fax from Pharmacy on Sep 17, 2009 2:09 PM  Refills Requested: Medication #1:  FLONASE 50 MCG/ACT SUSP Two sprays per nostril daily  Method Requested: Telephone to Pharmacy Initial call taken by: [name] RN,  Sep 17, 2009 2:09 PM  Follow-up for Phone Call        Refill approved-nurse to complete    Prescriptions: FLONASE 50 MCG/ACT SUSP (FLUTICASONE PROPIONATE) Two sprays per nostril daily  #1 x 2   Entered and Authorized by:   Elby Showers MD   Signed by:   Elby Showers MD on 09/17/2009   Method used:   Telephoned to ...       [facility] (retail)       [address]       [place], [place]  [address]       Ph: [phone]       Fax: [phone]   RxID:   [id number]   Appended Document: med refill/gp Flonase rx refill  [facility].

## 2010-05-24 NOTE — Letter (Signed)
Summary: [facility]  [facility]   Imported By: Florinda Marker 05/10/2009 14:16:25  _____________________________________________________________________  External Attachment:    Type:   Image     Comment:   External Document

## 2010-05-24 NOTE — Assessment & Plan Note (Signed)
Summary: EST-2 WEEK RECHECK/CH   Vital Signs:  Patient profile:   55 year old female Height:      65 inches (165.10 cm) Weight:      164.0 pounds (74.55 kg) BMI:     27.39 Temp:     99.2 degrees F (37.33 degrees C) oral Pulse rate:   114 / minute BP sitting:   123 / 84  (left arm) Cuff size:   regular  Vitals Entered By: Theotis Barrio NT II (September 28, 2009 1:48 PM)  Primary Care Provider:  Olene Craven   History of Present Illness: 55 yo with pmh outlined in this chart here for 2 week follow up of back pain and otitis externa.  We tried to refer her to a PMNR doc as she is wheelchair bound and is having sciatic pain and would need specialized assistance.  Turns out she already goes to [facility], pain managment doc in [place] with [employer].  She she saw them this week and has a presciption for a prednisone taper and is going to get an MRI next week.  She can not take vicodin prescribed at the last appointment, it makes her feel too strange.  Her ear pain has improved and she is almost done with the ciprodex drops.  No drainage from the ear or swelling around the ear.  She is still interested in a home aid and has heard that someone will be by her house to discuss PCS, but has not seen anyone yet.   Current Medications (verified): 1)  Cyclobenzaprine Hcl 10 Mg Tabs (Cyclobenzaprine Hcl) .... Take 1 Tablet By Mouth Three Times A Day 2)  Maxzide-25 37.5-25 Mg Tabs (Triamterene-Hctz) .... Take 1 Tablet By Mouth Once A Day 3)  Effexor Xr 150 Mg Cp24 (Venlafaxine Hcl) .... Take 1 Tablet By Mouth Once A Day 4)  Nortriptyline Hcl 50 Mg Caps (Nortriptyline Hcl) .... Take 1 Tablet By Mouth At Bedtime 5)  Locoid 0.1 % Crea (Hydrocortisone Butyrate) .... Apply To Face Two Times A Day 6)  Lipitor 40 Mg  Tabs (Atorvastatin Calcium) .... Take One Tablet Daily. 7)  Ultram Er 100 Mg  Tb24 (Tramadol Hcl) .... Take One Tablet 4 Times Daily. 8)  Omeprazole 20 Mg Cpdr (Omeprazole)  .... Take 1 Tablet By Mouth Two Times A Day 9)  Flonase 50 Mcg/act Susp (Fluticasone Propionate) .... Two Sprays Per Nostril Daily 10)  Miralax  Powd (Polyethylene Glycol 3350) .... Take 17g Daily in Liquid For Constipation.  Allergies (verified): 1)  ! Codeine 2)  ! Penicillin 3)  ! Morphine 4)  ! Oxycontin (Oxycodone Hcl)  Review of Systems       per hpi  Physical Exam  General:  alert and well-developed.   Head:  normocephalic and atraumatic.   Ears:  R ear normal and L ear normal.   Mouth:  pharynx pink and moist.   Lungs:  normal respiratory effort and normal breath sounds.   Heart:  normal rate, regular rhythm, no murmur, no gallop, and no rub.   Extremities:  no edema Neurologic:  alert & oriented X3 and cranial nerves II-XII intact.  wheelchair bound. Psych:  Oriented X3, memory intact for recent and remote, normally interactive, good eye contact, and not anxious appearing.     Impression & Recommendations:  Problem # 1:  DEGENERATIVE DISC DISEASE, CERVICAL SPINE (ICD-722.4)  Her new sciatic type pain has improved a little.  We tried to refer her to  a PMNR doc as she is wheelchair bound and is having sciatic pain and would need specialized assistance.  Turns out she already goes to [facility], pain managment doc in [place] with [employer].  She she saw them this week and has a presciption for a prednisone taper and is going to get an MRI next week.  She can not take vicodin prescribed at the last appointment, it makes her feel too strange.  Orders: Social Work Referral (Social )  Problem # 2:  EXTERNAL OTITIS (ICD-380.10) Canal erythema has improved.  There is no pus, no drainage.  Still mild pain, but improved from previous with cipro drops.    The following medications were removed from the medication list:    Cipro Hc 0.2-1 % Susp (Ciprofloxacin-hydrocortisone) ..... Use three drops in left ear two times a day for two weeks.  Problem # 3:  TOBACCO  ABUSE (ICD-305.1)  would like to have patchs but says that [facility] will not pay for them.  i have written down the name of the patches and said I would be happy to prescribe them.  discussed smoking cessation at length.  Will refer to DT.  Orders: Social Work Referral (Social )  Problem # 4:  HYPERTENSION (ICD-401.9) BP is great today, no changes.  Her updated medication list for this problem includes:    Maxzide-25 37.5-25 Mg Tabs (Triamterene-hctz) .Marland Kitchen... Take 1 tablet by mouth once a day  BP today: 123/84 Prior BP: 100/80 (09/06/2009)  Labs Reviewed: K+: 3.6 (09/06/2009) Creat: : 0.96 (09/06/2009)   Chol: 164 (09/06/2009)   HDL: 33 (09/06/2009)   LDL: 104 (09/06/2009)   TG: 136 (09/06/2009)  Problem # 5:  HYPERLIPIDEMIA (ICD-272.4) lipids fine on last check.  Her updated medication list for this problem includes:    Lipitor 40 Mg Tabs (Atorvastatin calcium) .Marland Kitchen... Take one tablet daily.  Labs Reviewed: SGOT: 14 (09/06/2009)   SGPT: 18 (09/06/2009)   HDL:33 (09/06/2009), 24 (08/31/2008)  LDL:104 (09/06/2009), 77 (95/62/1308)  Chol:164 (09/06/2009), 136 (08/31/2008)  Trig:136 (09/06/2009), 177 (08/31/2008)  Problem # 6:  DEPRESSION (ICD-311) No signs of depression today.  She is bright, interactive and states that her mood is fine.  Continue current medications.  Her updated medication list for this problem includes:    Effexor Xr 150 Mg Cp24 (Venlafaxine hcl) .Marland Kitchen... Take 1 tablet by mouth once a day    Nortriptyline Hcl 50 Mg Caps (Nortriptyline hcl) .Marland Kitchen... Take 1 tablet by mouth at bedtime  Complete Medication List: 1)  Cyclobenzaprine Hcl 10 Mg Tabs (Cyclobenzaprine hcl) .... Take 1 tablet by mouth three times a day 2)  Maxzide-25 37.5-25 Mg Tabs (Triamterene-hctz) .... Take 1 tablet by mouth once a day 3)  Effexor Xr 150 Mg Cp24 (Venlafaxine hcl) .... Take 1 tablet by mouth once a day 4)  Nortriptyline Hcl 50 Mg Caps (Nortriptyline hcl) .... Take 1 tablet by mouth at  bedtime 5)  Locoid 0.1 % Crea (Hydrocortisone butyrate) .... Apply to face two times a day 6)  Lipitor 40 Mg Tabs (Atorvastatin calcium) .... Take one tablet daily. 7)  Ultram Er 100 Mg Tb24 (Tramadol hcl) .... Take one tablet 4 times daily. 8)  Omeprazole 20 Mg Cpdr (Omeprazole) .... Take 1 tablet by mouth two times a day 9)  Flonase 50 Mcg/act Susp (Fluticasone propionate) .... Two sprays per nostril daily 10)  Miralax Powd (Polyethylene glycol 3350) .... Take 17g daily in liquid for constipation.  Patient Instructions: 1)  Please  schedule a follow-up appointment in 6 months. 2)  The patches you are interested are called Nicoderm, see if you can find a generic that is affordable. 3)  Continue to follow up with your pain doctor for back pain.  Please ask them to forward their notes. 4)  Stoney Bang will be in touch with you about personal care assistance.  Prevention & Chronic Care Immunizations   Influenza vaccine: Fluvax 3+  (01/27/2009)    Tetanus booster: Not documented    Pneumococcal vaccine: Historical  (02/17/2005)  Colorectal Screening   Hemoccult: Negative  (02/25/2005)    Colonoscopy: Refused  (03/22/2006)   Colonoscopy action/deferral: GI referral  (09/06/2009)  Other Screening   Pap smear: Normal  (06/20/2006)   Pap smear action/deferral: PAP smear done  (06/20/2006)    Mammogram: ASSESSMENT: Negative - BI-RADS 1^MM DIGITAL SCREENING  (05/31/2009)   Mammogram due: 04/2010   Smoking status: current  (09/06/2009)   Smoking cessation counseling: yes  (09/06/2009)  Lipids   Total Cholesterol: 164  (09/06/2009)   Lipid panel action/deferral: Lipid Panel ordered   LDL: 104  (09/06/2009)   LDL Direct: Not documented   HDL: 33  (09/06/2009)   Triglycerides: 136  (09/06/2009)    SGOT (AST): 14  (09/06/2009)   BMP action: Ordered   SGPT (ALT): 18  (09/06/2009)   Alkaline phosphatase: 81  (09/06/2009)   Total bilirubin: 0.3  (09/06/2009)    Lipid flowsheet  reviewed?: Yes   Progress toward LDL goal: Unchanged  Hypertension   Last Blood Pressure: 123 / 84  (09/28/2009)   Serum creatinine: 0.96  (09/06/2009)   BMP action: Ordered   Serum potassium 3.6  (09/06/2009)    Hypertension flowsheet reviewed?: Yes   Progress toward BP goal: At goal  Self-Management Support :    Hypertension self-management support: Not documented    Lipid self-management support: Not documented

## 2010-05-24 NOTE — Assessment & Plan Note (Signed)
Summary: HFU- BMET AND POTASSIUM LEVEL HYPOKALMEMIA PER DR MADERA/CFB   Vital Signs:  Patient Profile:   55 Years Old Female Height:     65 inches (165.10 cm) Weight:      159.6 pounds (72.55 kg) BMI:     26.65 Temp:     98.1 degrees F (36.72 degrees C) oral Pulse rate:   66 / minute BP sitting:   124 / 75  (left arm)  Pt. in pain?   yes    Location:   chest    Intensity:   8    Type:       indigestion  Vitals Entered By: [name] RN (May 17, 2007 11:33 AM)              Is Patient Diabetic? No Nutritional Status BMI of 25 - 29 = overweight  Have you ever been in a relationship where you felt threatened, hurt or afraid?No   Does patient need assistance? Functional Status Cook/clean, Shopping, Social activities Ambulation Impaired:Risk for fall, Wheelchair Comments electric wheelchair; HOH     History of Present Illness: This is a  year old woman with past medical history of   Depression follows with Dr. 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 Terri Piedra Hearing loss from recurrent otitis with R hearing aid, followed by ENT Recurrent otitis externa Tobacco abuse Hx of recurrent yeast vaginitis and Trichomonas  Here today for hospital follow up, as she had been hypokalemic in hospital.  She will get a BMET.  She has been feeling well since her hospital visit.  She continues to have a feeling of food getting stuck in her throat, but is able to swallow without difficulty.  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  The pyloric sphincter was balloon dialated.  She was placed on omeprazole 40 mg two times a day and she will follow up with Dr. Elnoria Howard  in his clinic in one month.  She also reports constipation and strain with BM's.       Current Allergies: ! CODEINE ! PENICILLIN ! MORPHINE    Risk Factors:  Tobacco use:  current    Cigarettes:  Yes -- 1/2 pack(s) per day    Counseled to quit/cut down tobacco use:  yes Drug use:  no Alcohol use:  no Exercise:  no  Family History Risk Factors:    Family History of MI in females < 31 years old:  yes  Colonoscopy History:    Date of Last Colonoscopy:  03/22/2006  Mammogram History:    Date of Last Mammogram:  04/23/2007  PAP Smear History:    Date of Last PAP Smear:  06/20/2006   Review of Systems       The patient complains of severe indigestion/heartburn.  The patient denies anorexia, fever, chest pain, syncope, and abdominal pain.    General      Denies chills, fatigue, fever, and loss of appetite.  CV      Denies chest pain or discomfort.  Resp      Denies shortness of breath.  GI      Complains of abdominal pain, constipation, gas, and indigestion.      Denies bloody stools, dark tarry stools,  nausea, and vomiting.   Physical Exam  General:     alert, appropriate dress, and healthy-appearing.   Ears:     Hard of hearing.  Hearing aid in place. Chest Wall:     no deformities.   Lungs:     normal respiratory effort and normal breath sounds.   Heart:     normal rate, regular rhythm, and no murmur.   Abdomen:     soft, non-tender, and normal bowel sounds.   Skin:     turgor normal and color normal.   Psych:     Oriented X3, memory intact for recent and remote, normally interactive, and good eye contact.      Impression & Recommendations:  Problem # 1:  DYSPHAGIA UNSPECIFIED (ICD-787.20) Still having symptom of food getting stuck in her throat.  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  The pyloric sphincter was balloon dialated.  She was placed on omeprazole 40 mg two times a day and she will follow  up with Dr. Elnoria Howard in his clinic in one month. I have refilled her omeprazole for one year and explained that it is to control her stomach acid, which should decrease irratation in her esophagus and decrease her symptoms.  Orders: T-Basic Metabolic Panel [phone])   Problem # 2:  CONSTIPATION (ICD-564.00) She reports some times going for 3 weeks without BM.  She reports good fiber and water intake. She used to be on miralax which worked well for her.  I will re-prescribe the miralax, she can refill it prn.  She does not take lactulose and did not want it refilled.  This was removed from her med list.  The following medications were removed from the medication list:    Lactulose 10 Gm/16ml Soln (Lactulose) .Marland Kitchen... Take 15-68ml each day, titrate for 1 bowel movement each day  Her updated medication list for this problem includes:    Miralax Powd (Polyethylene glycol 3350) .Marland Kitchen... Take 1 heaping teaspoon in one glass of liquid once daily   Problem # 3:  TOBACCO ABUSE (ICD-305.1) Ms. Denes is very interested in quitting smoking.  She liked the patches she was on in the hospital and would like a prescription.  I have called it in to her pharmacy.  This should be discussed at her next appointment.  Orders: Social Work Referral (Social )  Her updated medication list for this problem includes:    Eq Nicotine 21 Mg/24hr Pt24 (Nicotine) .Marland Kitchen... Change patch every 24 hrs    Eq Nicotine 14 Mg/24hr Pt24 (Nicotine) .Marland Kitchen... Change patch every 24 hours    Eq Nicotine 7 Mg/24hr Pt24 (Nicotine) .Marland Kitchen... Change patch every 24 hours.   Problem # 4:  HYPERLIPIDEMIA (ICD-272.4) Lipids were high at last check.  Will increase lipitor from 20 to 40 mg once daily.  She will come back in 6 mo for CMET.   Her updated medication list for this problem includes:    Lipitor 40 Mg Tabs (Atorvastatin calcium) .Marland Kitchen... Take one tablet daily.  Labs Reviewed: Chol: 224 (04/30/2007)   HDL: 32 (04/30/2007)   LDL: 167 (04/30/2007)    TG: 127 (04/30/2007) SGOT: 12 (04/30/2007)   SGPT: 9 (04/30/2007)   Complete Medication List: 1)  Cyclobenzaprine Hcl 10 Mg Tabs (Cyclobenzaprine hcl) .... Take 1 tablet by mouth three times a day 2)  Maxzide-25 37.5-25 Mg Tabs (Triamterene-hctz) .... Take 1 tablet by mouth once a day 3)  Effexor Xr 150 Mg Cp24 (Venlafaxine  hcl) .... Take 1 tablet by mouth once a day 4)  Nortriptyline Hcl 50 Mg Caps (Nortriptyline hcl) .... Take 1 tablet by mouth at bedtime 5)  Elidel 1 % Crea (Pimecrolimus) .... Apply to face two times a day 6)  Locoid 0.1 % Crea (Hydrocortisone butyrate) .... Apply to face two times a day 7)  Lipitor 40 Mg Tabs (Atorvastatin calcium) .... Take one tablet daily. 8)  Miralax Powd (Polyethylene glycol 3350) .... Take 1 heaping teaspoon in one glass of liquid once daily 9)  Ultram Er 100 Mg Tb24 (Tramadol hcl) .... Take one tablet 4 times daily. 10)  [facility] Omeprazole 20 Mg Tbec (Omeprazole) .... Take two tablets twice daily. 11)  Eq Nicotine 21 Mg/24hr Pt24 (Nicotine) .... Change patch every 24 hrs 12)  Eq Nicotine 14 Mg/24hr Pt24 (Nicotine) .... Change patch every 24 hours 13)  Eq Nicotine 7 Mg/24hr Pt24 (Nicotine) .... Change patch every 24 hours.   Patient Instructions: 1)  Please schedule a follow-up appointment in 6 months. 2)  You had labs drawn today, I will call you if there is anything that needs to be addressed.    Prescriptions: EQ NICOTINE 7 MG/24HR  PT24 (NICOTINE) Change patch every 24 hours.  #14 x 0   Entered and Authorized by:   Elby Showers MD   Signed by:   Elby Showers MD on 05/17/2007   Method used:   Print then Give to Patient   RxID:   [id number] EQ NICOTINE 14 MG/24HR  PT24 (NICOTINE) Change patch every 24 hours  #14 x 0   Entered and Authorized by:   Elby Showers MD   Signed by:   Elby Showers MD on 05/17/2007   Method used:   Print then Give to Patient   RxID:   [id number] EQ NICOTINE 21 MG/24HR  PT24 (NICOTINE)  Change patch every 24 hrs  #30 x 0   Entered and Authorized by:   Elby Showers MD   Signed by:   Elby Showers MD on 05/17/2007   Method used:   Print then Give to Patient   RxID:   [id number] [facility] OMEPRAZOLE 20 MG  TBEC (OMEPRAZOLE) Take two tablets twice daily.  #120 x 11   Entered and Authorized by:   Elby Showers MD   Signed by:   Elby Showers MD on 05/17/2007   Method used:   Print then Give to Patient   RxID:   [id number] MIRALAX  POWD (POLYETHYLENE GLYCOL 3350) Take 1 heaping teaspoon in one glass of liquid once daily  #1 bottle x prn   Entered and Authorized by:   Elby Showers MD   Signed by:   Elby Showers MD on 05/17/2007   Method used:   Print then Give to Patient   RxID:   [id number] LIPITOR 40 MG  TABS (ATORVASTATIN CALCIUM) Take one tablet daily.  #32 x 11   Entered and Authorized by:   Elby Showers MD   Signed by:   Elby Showers MD on 05/17/2007   Method used:   Print then Give to Patient   RxID:   [id number]  ]  Impression & Recommendations:  Problem # 1:  DYSPHAGIA UNSPECIFIED (ICD-787.20) Still having symptom of food getting stuck in her throat.  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  The pyloric sphincter was balloon dialated.  She was placed on omeprazole 40 mg two times a day and she will follow up with Dr.  Elnoria Howard in his clinic in one month. I have refilled her omeprazole for one year and explained that it is to control her stomach acid, which should decrease irratation in her esophagus and decrease her symptoms.  Orders: T-Basic Metabolic Panel [phone])   Problem # 2:  CONSTIPATION (ICD-564.00) She reports some times going for 3 weeks without BM.  She reports good fiber and water intake. She used to be on miralax which worked well for her.  I will re-prescribe the miralax, she can refill it prn.  She does not take lactulose and did not want it refilled.  This was  removed from her med list.  The following medications were removed from the medication list:    Lactulose 10 Gm/56ml Soln (Lactulose) .Marland Kitchen... Take 15-15ml each day, titrate for 1 bowel movement each day  Her updated medication list for this problem includes:    Miralax Powd (Polyethylene glycol 3350) .Marland Kitchen... Take 1 heaping teaspoon in one glass of liquid once daily   Problem # 3:  TOBACCO ABUSE (ICD-305.1) Ms. Schlatter is very interested in quitting smoking.  She liked the patches she was on in the hospital and would like a prescription.  I have called it in to her pharmacy.  This should be discussed at her next appointment.  Orders: Social Work Referral (Social )  Her updated medication list for this problem includes:    Eq Nicotine 21 Mg/24hr Pt24 (Nicotine) .Marland Kitchen... Change patch every 24 hrs    Eq Nicotine 14 Mg/24hr Pt24 (Nicotine) .Marland Kitchen... Change patch every 24 hours    Eq Nicotine 7 Mg/24hr Pt24 (Nicotine) .Marland Kitchen... Change patch every 24 hours.   Problem # 4:  HYPERLIPIDEMIA (ICD-272.4) Lipids were high at last check.  Will increase lipitor from 20 to 40 mg once daily.  She will come back in 6 mo for CMET.   Her updated medication list for this problem includes:    Lipitor 40 Mg Tabs (Atorvastatin calcium) .Marland Kitchen... Take one tablet daily.  Labs Reviewed: Chol: 224 (04/30/2007)   HDL: 32 (04/30/2007)   LDL: 167 (04/30/2007)   TG: 127 (04/30/2007) SGOT: 12 (04/30/2007)   SGPT: 9 (04/30/2007)   Complete Medication List: 1)  Cyclobenzaprine Hcl 10 Mg Tabs (Cyclobenzaprine hcl) .... Take 1 tablet by mouth three times a day 2)  Maxzide-25 37.5-25 Mg Tabs (Triamterene-hctz) .... Take 1 tablet by mouth once a day 3)  Effexor Xr 150 Mg Cp24 (Venlafaxine hcl) .... Take 1 tablet by mouth once a day 4)  Nortriptyline Hcl 50 Mg Caps (Nortriptyline hcl) .... Take 1 tablet by mouth at bedtime 5)  Elidel 1 % Crea (Pimecrolimus) .... Apply to face two times a day 6)  Locoid 0.1 % Crea (Hydrocortisone  butyrate) .... Apply to face two times a day 7)  Lipitor 40 Mg Tabs (Atorvastatin calcium) .... Take one tablet daily. 8)  Miralax Powd (Polyethylene glycol 3350) .... Take 1 heaping teaspoon in one glass of liquid once daily 9)  Ultram Er 100 Mg Tb24 (Tramadol hcl) .... Take one tablet 4 times daily. 10)  [facility] Omeprazole 20 Mg Tbec (Omeprazole) .... Take two tablets twice daily. 11)  Eq Nicotine 21 Mg/24hr Pt24 (Nicotine) .... Change patch every 24 hrs 12)  Eq Nicotine 14 Mg/24hr Pt24 (Nicotine) .... Change patch every 24 hours 13)  Eq Nicotine 7 Mg/24hr Pt24 (Nicotine) .... Change patch every 24 hours.      History of Present Illness: This is a  year old woman with past medical  history of   Depression follows with Dr. 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 Terri Piedra Hearing loss from recurrent otitis with R hearing aid, followed by ENT Recurrent otitis externa Tobacco abuse Hx of recurrent yeast vaginitis and Trichomonas  Here today for hospital follow up, as she had been hypokalemic in hospital.  She will get a BMET.  She has been feeling well since her hospital visit.  She continues to have a feeling of food getting stuck in her throat, but is able to swallow without difficulty.  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  The pyloric sphincter was balloon dialated.  She was placed on omeprazole 40 mg two times a day and she will follow up with Dr. Elnoria Howard in his clinic in one month.  She also reports constipation and strain with BM's.

## 2010-05-24 NOTE — Miscellaneous (Signed)
Summary: SW  Reviewed chart and see that Miranda Gaines will likely qualify for PCS.  Form completed and signed by physician. Faxed to [facility] today.

## 2010-05-24 NOTE — Progress Notes (Signed)
Summary: med refill/gp  Phone Note Refill Request Message from:  Patient on January 27, 2009 10:33 AM  Refills Requested: Medication #1:  FLONASE 50 MCG/ACT SUSP Two sprays per nostril daily  Method Requested: Electronic Initial call taken by: [name] RN,  January 27, 2009 10:33 AM    Prescriptions: Aleda Grana 50 MCG/ACT SUSP (FLUTICASONE PROPIONATE) Two sprays per nostril daily  #1 x 2   Entered and Authorized by:   Elby Showers MD   Signed by:   Elby Showers MD on 01/29/2009   Method used:   Electronically to        [facility]* (retail)       [address] Felicita Gage [address] [place], [place]  [address]       Ph: [phone] or [phone]       Fax: [phone]   RxID:   [id number]

## 2010-05-24 NOTE — Assessment & Plan Note (Signed)
Summary: PT LEFT EAR HURT [MKJ]   Vital Signs:  Patient Profile:   55 Years Old Female Weight:      158.8 pounds Temp:     98.2 degrees F oral Pulse rate:   105 / minute BP sitting:   144 / 90  (right arm)  Pt. in pain?   yes    Location:   right ear    Intensity:   9  Vitals Entered By: Krystal Eaton Duncan Dull) (June 20, 2006 10:15 AM)              Is Patient Diabetic? No Nutritional Status Normal  Have you ever been in a relationship where you felt threatened, hurt or afraid?No   Does patient need assistance? Ambulation Wheelchair   Chief Complaint:  right ear pain.  History of Present Illness: Pt is a 55 y/o woman with a history of HTN, dyslipidemia, major depression, GERD, deconditioning from chronic back and RLE pain from disc injury who also has a R hearing aid.  Comes in with c/o of R ear pain. Started 2 weeks PTV. Throbbing pain and itches. White discharge from R ear. Constant R temporal headache 6/10 throbbing in quality. No visual disturbances. No photophobia or phonophobia. No eye pain, eye tearing or redness. Denies any numbness and paresthesias. Pt still experiencing heartburn and epigastric pain about twice a week despite having increased her omeprazole to 40 mg daily. Symptoms brought on by certain foods (carrots, fried chicken). No dyshphagia, no odynophagia, no hematemesis, no melena or weight loss. Has been having heartburn for about 2 years now. Mentions constipation: hard stools. Hasn't had a BM in 8 days. Last BM at beginning of month; very big and hard stool with blood surrounding stool and on paper. Didn't feel an anal protuberance. No further bleeding afterwards (neither on paper or in water).   Prior Medications (reviewed today): CYCLOBENZAPRINE HCL 10 MG TABS (CYCLOBENZAPRINE HCL) Take 1 tablet by mouth three times a day NEURONTIN 600 MG TABS (GABAPENTIN) Take 1 tablet by mouth three times a day MAXZIDE-25 37.5-25 MG TABS (TRIAMTERENE-HCTZ) Take 1  tablet by mouth once a day EFFEXOR XR 150 MG CP24 (VENLAFAXINE HCL) Take 1 tablet by mouth once a day NORTRIPTYLINE HCL 50 MG CAPS (NORTRIPTYLINE HCL) Take 1 tablet by mouth at bedtime ELIDEL 1 % CREA (PIMECROLIMUS) Apply to face two times a day LOCOID 0.1 % CREA (HYDROCORTISONE BUTYRATE) Apply to face two times a day LIPITOR 10 MG TABS (ATORVASTATIN CALCIUM) Take 1 tablet by mouth at bedtime Current Allergies (reviewed today): ! CODEINE ! PENICILLIN ! MORPHINE  Past Medical History:    Depression follows with Dr. 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 with R hearing aid, followed by ENT    Tobacco abuse    Hx of recurrent yeast vaginitis and Trichomonas     Review of Systems  General      Complains of sweats.      Denies chills, fatigue, fever, and weight loss.  CV      Denies chest pain or discomfort, fainting, and palpitations.  Resp      Denies cough, shortness of breath, and wheezing.  GI      Denies nausea and vomiting.  GU      Denies discharge and dysuria.  MS      Complains of low back pain.   Physical Exam  General:     NAD, in wheelchair Head:     normocephalic and atraumatic.   Eyes:     PERRLA,EOMI Ears:     no external deformities, R tragus tender, R decreased hearing, white thick discharge in R ear canal. Unable to visualize skin of ear canal. Tympanic membranes normal. L Cerumen subimpaction.   Mouth:     OP clear, no post nasal drip Neck:     supple, no masses, and no thyromegaly.   Lungs:     CTAB with good air mvt Heart:     RRR, no m/r/g Abdomen:     soft, non-tender, no distention, no  masses, no hepatomegaly, and no splenomegaly.   Genitalia:     normal introitus, no external lesions, mucosa pink and moist, no vaginal or cervical lesions, and vaginal discharge.   Pulses:     L and R posterior tibial normal. Extremities:     No cyanosis, clubbing or edema.    Impression & Recommendations:  Problem # 1:  EXTERNAL OTITIS (ICD-380.10) External otitis obvious on ear exam. No signs of tympanic involvement. Do not suspect mastoiditis or malignant external otitis (no diabetes and pt doesn't have any neurological sx). Risk factor: hearing aid. Will clean ear canal and culture the discharge to document whether or not pt has resistance to certain antibiotics (given recurrence of otitis). For pain, will use ibuprofen (OTC).  Her updated medication list for this problem includes:    Cipro Hc 0.2-1 % Susp (Ciprofloxacin-hydrocortisone) .Marland KitchenMarland KitchenMarland KitchenMarland Kitchen 3 drops in r ear two times a day  Orders: Cerumen Impaction Removal ([address]) T-Culture, Wound & Gram Stain (60454 / [id number])   Problem # 2:  HYPERLIPIDEMIA (ICD-272.4) Pt started on Lipitor 10 in 02/2006 at which time her goal LDL was calculated to be < 130. Since it has been 3 months, a FLP needs to be rechecked to evaluate response to tx and a CMET needs to be done to monitor LFTs.  Her updated medication list for this problem includes:    Lipitor 10 Mg Tabs (Atorvastatin calcium) .Marland Kitchen... Take 1 tablet by mouth at bedtime  Future Orders: T-Comprehensive Metabolic Panel ([id number]) ... 06/22/2006 T-Lipid Profile [id number]) ... 06/22/2006   Problem # 3:  CONSTIPATION (ICD-564.0) Pt's constipation sounds chronic and I suspect it is related to her deconditioning. Am not concerned about rectal bleeding that happened once with large hard stool because pt had a colonoscopy in 2007 (3 benign polyps removed). Was likely a small anal fissure that has now healed. No hemorroids on PE.  Counselled pt on importance of high fibre and hydration.  Will use daily Miralax to control constipation. If no success with Miralax, may use stool softeners as well but will avoid stimulants to avoid dependance.  Her updated medication list for this problem includes:    Miralax Powd (Polyethylene glycol 3350) .Marland Kitchen... Take 1 heaping teaspoon in one glass of liquid once daily   Problem # 4:  GYNECOLOGICAL EXAMINATION, ROUTINE (ICD-V72.31)  Orders: T-Pap Smear, Thin Prep (N6295) T-Wet Prep for Christoper Allegra, Clue Cells ([id number])   Problem # 5:  GERD (ICD-530.81) Pt has had GERD for 2 years now without significant relief with an appropriate  PPI trial. No alarm sx requiring immediate referral to GI. However, will screen for H. pylori. If +, will treat and see response to tx. If - will likely need GI referral for EGD.  Future Orders: T-Helicobacter AB - IgG ([id number]) ... 06/22/2006   Problem # 6:  HYPERTENSION (ICD-401.9) Pt's BP 140/90 today. Goal BP < 130/80 but pt was in pain from her otitis. Will monitor on next visit and evaluate whether changes need to be made to her regimen. Didn't have time to do ECG today but will need baseline on next visit. Microalbuminuria will also need to be evaluated to determine if an ACEI is warranted.  Her updated medication list for this problem includes:    Maxzide-25 37.5-25 Mg Tabs (Triamterene-hctz) .Marland Kitchen... Take 1 tablet by mouth once a day   Problem # 7:  Preventive Health Care (ICD-V70.0)  Medications Added to Medication List This Visit: 1)  Elidel 1 % Crea (Pimecrolimus) .... Apply to face two times a day 2)  Locoid 0.1 % Crea (Hydrocortisone butyrate) .... Apply to face two times a day 3)  Cipro Hc 0.2-1 % Susp (Ciprofloxacin-hydrocortisone) .... 3 drops in r ear two times a day 4)  Lipitor 10 Mg Tabs (Atorvastatin calcium) .... Take 1 tablet by mouth at bedtime 5)  Miralax Powd (Polyethylene glycol 3350) .... Take 1 heaping teaspoon in one glass of liquid once daily  PAP Screening:     Reviewed PAP smear recommendations:  PAP smear done  Mammogram Screening:    Last Mammogram:  02/05/2004  Osteoporosis Risk Assessment:  Risk Factors for Fracture or Low Bone Density:   Smoking status:       current  Immunization & Chemoprophylaxis:    Influenza vaccine: Historical  (02/20/2005)    Pneumovax: Historical  (02/17/2005)   Patient Instructions: 1)  Avoid foods high in acid content (tomatoes, citrus juices, spicy foods). Avoid fried foods, spicy foods, soda, chocolate, alcohol and mint. Avoid eating within two hours of lying down or before exercising. Do not over eat; try smaller more frequent meals. Elevate head of bed twelve inches when sleeping. 2)  Please schedule a follow-up appointment in 2 weeks. 3)  Be sure to return for a FASTING Lipid Profile one (1) week before your next appointment as scheduled. 4)  Try to increase your fiber intake (fruit, vegetables, whole grains) and to drink a lot of water. 5)  For your constipation, take 1 heaping teaspoon of MiraLax in one glass of liquid every day. 6)  For pain, you may take ibuprofen 200 mg tabs (2-3 tabs) every 4 to 6 hours. Prescriptions: CIPRO HC 0.2-1 % SUSP (CIPROFLOXACIN-HYDROCORTISONE) 3 drops in R ear two times a day  #1 x 0   Entered and Authorized by:   Olene Craven MD   Signed by:   Olene Craven MD on 06/20/2006   Method used:   Handwritten   RxID:   [id number]

## 2010-05-24 NOTE — Miscellaneous (Signed)
Summary: [facility] : Home Health Cert. & Plan Of Care  [facility] : Home Health Cert. & Plan Of Care   Imported By: Florinda Marker 09/15/2008 14:26:51  _____________________________________________________________________  External Attachment:    Type:   Image     Comment:   External Document

## 2010-05-24 NOTE — Letter (Signed)
Summary: [facility]  [facility]   Imported By: Margie Billet 05/24/2007 15:13:56  _____________________________________________________________________  External Attachment:    Type:   Image     Comment:   External Document

## 2010-05-24 NOTE — Progress Notes (Signed)
Summary: Soc. 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 Work Emergency planning/management officer of Call: [employer] to check on PCS referral.  Receptionist said that nurse had just been assigned to go out and make contact with [name].

## 2010-05-24 NOTE — Progress Notes (Signed)
Summary: Refill/gh  Phone Note Refill Request Message from:  Pharmacy on June 22, 2008 2:15 PM  Refills Requested: Medication #1:  LIPITOR 40 MG  TABS Take one tablet daily.   Last Refilled: 03/24/2008  Method Requested: Electronic Initial call taken by: Angelina Ok RN,  June 22, 2008 2:15 PM      Prescriptions: LIPITOR 40 MG  TABS (ATORVASTATIN CALCIUM) Take one tablet daily.  #32 x 11   Entered and Authorized by:   Olene Craven MD   Signed by:   Olene Craven MD on 06/22/2008   Method used:   Electronically to        [facility]* (retail)       [address] Felicita Gage [address] [place], [place]  [address]       Ph: [phone] or [phone]       Fax: [phone]   RxID:   [id number]

## 2010-05-24 NOTE — Miscellaneous (Signed)
Summary: [facility]: Nurse Notes  [facility]: Nurse Notes   Imported By: Florinda Marker 09/10/2008 14:46:08  _____________________________________________________________________  External Attachment:    Type:   Image     Comment:   External Document

## 2010-05-24 NOTE — Assessment & Plan Note (Signed)
Summary: 2WK FU/CHARRON/VS   Vital Signs:  Patient Profile:   55 Years Old Female Weight:      160.9 pounds (73.14 kg) Temp:     97.0 degrees F (36.11 degrees C) oral Pulse rate:   104 / minute BP sitting:   124 / 83  (left arm)  Pt. in pain?   yes    Location:   lower back    Intensity:   6  Vitals Entered By: Stanton Kidney Ditzler RN (July 04, 2006 10:07 AM)              Is Patient Diabetic? No Nutritional Status Normal Nutritional Status Detail fair  Have you ever been in a relationship where you felt threatened, hurt or afraid?denies   Does patient need assistance? Functional Status Self care Ambulation Wheelchair   Chief Complaint:  FU and to ck rash left armfor past 3 weeks-itching.  History of Present Illness: Ms Bedwell is a 55 y.o. F followed in [facility] for hyperlipidemia, HTN, low back pain and disk disease, and recurrent otitis externa who presents for clnic f/u.  She was last seen by Dr Reynold Bowen  ~2 weeks prior and treated for recurrent otitis externa with cipro ear drops.  Since that time Ms Greenwalt reports that the drops have worked well - she denies any ear pain, discharge, redness, swelling.  She does have some complaints this morning.  The first, she has noted a "rash" on her left forearm that began approximately 3 weeks ago.  Initially there were several small bumps on her forearm that were itchy; she's been scratching them a good amount.  No burning; no other areas that have been affected.  Her second issue is constipation.  She's been taking Miralax that was recommended to her during her last visit, but still has not had a bowel movement over the past 3 weeks.  Prior Medications: CYCLOBENZAPRINE HCL 10 MG TABS (CYCLOBENZAPRINE HCL) Take 1 tablet by mouth three times a day NEURONTIN 600 MG TABS (GABAPENTIN) Take 1 tablet by mouth three times a day MAXZIDE-25 37.5-25 MG TABS (TRIAMTERENE-HCTZ) Take 1 tablet by mouth once a day EFFEXOR XR 150 MG CP24 (VENLAFAXINE HCL) Take 1  tablet by mouth once a day NORTRIPTYLINE HCL 50 MG CAPS (NORTRIPTYLINE HCL) Take 1 tablet by mouth at bedtime ELIDEL 1 % CREA (PIMECROLIMUS) Apply to face two times a day LOCOID 0.1 % CREA (HYDROCORTISONE BUTYRATE) Apply to face two times a day LIPITOR 20 MG TABS (ATORVASTATIN CALCIUM) Take 1 tablet by mouth once a day MIRALAX  POWD (POLYETHYLENE GLYCOL 3350) Take 1 heaping teaspoon in one glass of liquid once daily Current Allergies (reviewed today): ! CODEINE ! PENICILLIN ! MORPHINE  Past Medical History:    Depression follows with Dr. 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 with R hearing aid, followed by ENT    Recurrent otitis externa    Tobacco abuse    Hx of recurrent yeast vaginitis and Trichomonas    Risk Factors: Tobacco use:  current    Cigarettes:  Yes -- 1/2 pack(s) per day  Drug use:  no Alcohol use:  no  Family History Risk Factors:    Family History of MI in females < 7 years old:  yes  Colonoscopy History:    Date of Last Colonoscopy:  03/22/2006  PAP Smear History:     Date of Last PAP Smear:  06/20/2006    Results:  Normal  Mammogram History:     Date of Last Mammogram:  04/12/2006    Results:  Normal Bilateral    Review of Systems  General      Denies chills and fever.  Eyes      Denies blurring and vision loss-both eyes.  ENT      Complains of decreased hearing.      Denies ear discharge and earache.  CV      Denies chest pain or discomfort, fatigue, and shortness of breath with exertion.  Resp      Denies chest discomfort, chest pain with inspiration, cough, and shortness of  breath.  GI      Complains of constipation, gas, and indigestion.      Denies abdominal pain, loss of appetite, nausea, and vomiting.  GU      Denies abnormal vaginal bleeding, dysuria, and urinary frequency.  Derm      Complains of dryness and itching.      Denies insect bite(s).  Neuro      Denies numbness, visual disturbances, and weakness.   Physical Exam  General:     sitting in motorized wheel chair; alert, well-developed, well-nourished, and well-hydrated.   Head:     normocephalic and atraumatic.   Eyes:     vision grossly intact, pupils equal, pupils round, and pupils reactive to light.  1+ inection bilaterally Ears:     bilateral hearing aids. R ear normal, L ear normal, and no external deformities.   Nose:     no external deformity.   Mouth:     dentures, upper and lower.pharynx pink and moist.   Neck:     supple, no masses, and no thyromegaly.   Lungs:     normal respiratory effort, no intercostal retractions, no accessory muscle use, normal breath sounds, no crackles, and no wheezes.   Heart:     normal rate, regular rhythm, no murmur, no gallop, and no rub.   Abdomen:     soft, non-tender, normal bowel sounds, no masses, no guarding, and no rigidity.   Msk:     no joint tenderness, no joint swelling, and no redness over joints.   Neurologic:     alert & oriented X3 and sensation intact to light touch.   Skin:      diffusely dry skin with prominent follicles L anterior forearm:  8-9 small (4mm) papules, scattered, with overlying excoriation; no erythema Cervical Nodes:     no anterior cervical adenopathy.   Psych:     Oriented X3, memory intact for recent and remote, normally interactive, good eye contact, and not anxious appearing.      Impression & Recommendations:  Problem # 1:  XEROSIS, SKIN (ICD-706.8) Her skin changes are most consistent with dry skin complicated by excessive scratching and overlying excoriation.  I've recommended first and  foremost that the patient refrain from scratching these areas.  Second, during the winter to avoid excessively hot baths/showers.  After bathing I've recommended that she apply a thick emolient type lotion (eucerin, etc.).  If this is not improved by the next visit, we can ask that she  return to her dermatologist.  Problem # 2:  CONSTIPATION (ICD-564.0) This is likely 2/2 pain medications.  (we need a list of what she is taking from the pain docs).  In the interim, I've given her a perscription for lactulose.  I think eventually she will need to be on a standing dose of stool softener (ie, Colace), once we get her started having BM's in the first place.   Her updated medication list for this problem includes:    Miralax Powd (Polyethylene glycol 3350) .Marland Kitchen... Take 1 heaping teaspoon in one glass of liquid once daily    Lactulose 10 Gm/75ml Soln (Lactulose) .Marland Kitchen... Take 15-23ml each day, titrate for 1 bowel movement each day   Problem # 3:  GYNECOLOGICAL EXAMINATION, ROUTINE (ICD-V72.31) Reviewed the results with the patient.  No sx's of yeast infection, so no treatment for now.  She asked that if douching is required, which I've told her is not neccessary.   Problem # 4:  HYPERLIPIDEMIA (ICD-272.4) See Dr. Audie Box notes on this; goal LDL <130.  Still not at goal.  LFT's normal, so I've instructed her to inrease Lipitor from 10mg  daily to 20mg  daily.  We will ask for her to return in 3 months for repeate FLP to see how the new dose of statin has done. Her updated medication list for this problem includes:    Lipitor 20 Mg Tabs (Atorvastatin calcium) .Marland Kitchen... Take 1 tablet by mouth once a day  Future Orders: T-Comprehensive Metabolic Panel ([id number]) ... 07/05/2006 T-Lipid Profile [id number]) ... 07/05/2006   Problem # 5:  EXTERNAL OTITIS (ICD-380.10) Resolved.  Culture data grew no predominant organisim.    Problem # 6:  HYPERTENSION (ICD-401.9) BP at goal today.  I've congratulated her and  made no changes to her regimen. Her updated medication list for this problem includes:    Maxzide-25 37.5-25 Mg Tabs (Triamterene-hctz) .Marland Kitchen... Take 1 tablet by mouth once a day   Problem # 7:  LOW BACK PAIN (ICD-724.2) She is on chronic pain meds, I believe, perscribed by a pain specialist.  I am going to try to have whatever records I can find faxed to [place] so that we can keep her med list.  Her updated medication list for this problem includes:    Cyclobenzaprine Hcl 10 Mg Tabs (Cyclobenzaprine hcl) .Marland Kitchen... Take 1 tablet by mouth three times a day   Problem # 8:  Preventive Health Care (ICD-V70.0) Normal pap 2 weeks ago. Normal mammogram 03/2006.  Medications Added to Medication List This Visit: 1)  Lipitor 20 Mg Tabs (Atorvastatin calcium) .... Take 1 tablet by mouth once a day 2)  Lactulose 10 Gm/30ml Soln (Lactulose) .... Take 15-82ml each day, titrate for 1 bowel movement each day Prescriptions: LACTULOSE 10 GM/15ML SOLN (LACTULOSE) Take 15-74ml each day, titrate for 1 bowel movement each day  #1 month supp x 3   Entered and Authorized by:   Antony Contras MD   Signed by:   Antony Contras MD on 07/04/2006   Method used:   Print then Give to Patient   RxID:   [id number] LIPITOR 20 MG TABS (ATORVASTATIN CALCIUM) Take 1 tablet by mouth once a day  #31 x 11   Entered and Authorized by:   Antony Contras MD   Signed by:   Antony Contras MD on 07/04/2006   Method used:   Print then Give to Patient   RxID:   [id number]    Patient Instructions: 1)  Please schedule a follow-up  appointment in 3 months. 2)  Please return for a FASTING Lipid Profile one (1) week before your next appointment as scheduled. 3)  Apply a thick lotion (such as Eucerin) that comes from a tub container (not pump) to your arms every day when you finish showering/bathing.  You can use an olive oil product with bathing as well.  Use the lotion as needed after your bath each day to keep your skin moisturized.  Try to  avoid scratching your arms.  4)  Start taking TWO (2) of your Lipitor 10mg  pills - when you run out of these fill the perscription for Lipitor 20mg  pills once each day 5)  Take Lactulose for your consitpation - it is a liquid solution that the pharmacist will give you.

## 2010-05-24 NOTE — Consult Note (Signed)
Summary: [facility]: [facility]: Dr. Elnoria Howard   Imported By: Florinda Marker 07/19/2007 14:56:38  _____________________________________________________________________  External Attachment:    Type:   Image     Comment:   External Document

## 2010-05-24 NOTE — Assessment & Plan Note (Signed)
Summary: FLU SHOT/CFB  Nurse Visit   Allergies: 1)  ! Codeine 2)  ! Penicillin 3)  ! Morphine  Orders Added: 1)  Flu Vaccine 70yrs + [90658] 2)  Administration Flu vaccine - MCR [G0008] Flu Vaccine Consent Questions     Do you have a history of severe allergic reactions to this vaccine? no    Any prior history of allergic reactions to egg and/or gelatin? no    Do you have a sensitivity to the preservative Thimersol? no    Do you have a past history of Guillan-Barre Syndrome? no    Do you currently have an acute febrile illness? no    Have you ever had a severe reaction to latex? no    Vaccine information given and explained to patient? yes    Are you currently pregnant? no    Lot [employer] A03  [employer]   Exp Date:07/22/2009   Site Given  Left Deltoid IM [name] RN  January 27, 2009 10:42 AM

## 2010-05-24 NOTE — Progress Notes (Signed)
Summary: Soc. 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 Work Call placed to: [facility] Summary of Call: [facility]  called  [phone]) to tell me Maribeth still does not have her PCS service.  I called [facility] and they said they never recd the fax sent on 5/27 and so I refaxed today.  Dorothe Pea  October 05, 2009 10:22 AM

## 2010-05-24 NOTE — Consult Note (Signed)
Summary: [facility]: [facility]: Dr. Elnoria Howard   Imported By: Florinda Marker 07/10/2007 15:50:45  _____________________________________________________________________  External Attachment:    Type:   Image     Comment:   External Document

## 2010-05-24 NOTE — Assessment & Plan Note (Signed)
Summary: CHECKUP/ FLU/ SB.   Vital Signs:  Patient Profile:   55 Years Old Female Height:     65 inches (165.10 cm) Weight:      153.7 pounds (69.86 kg) BMI:     25.67 Temp:     97.6 degrees F (36.44 degrees C) oral Pulse rate:   92 / minute BP sitting:   124 / 83  (left arm)  Pt. in pain?   yes    Location:   lower back    Intensity:   8  Vitals Entered By: Krystal Eaton Duncan Dull) (January 31, 2008 10:01 AM)               Have you ever been in a relationship where you felt threatened, hurt or afraid?No   Does patient need assistance? Ambulation Wheelchair Comments motorized w/c    Flu Vaccine Consent Questions     Do you have a history of severe allergic reactions to this vaccine? no    Any prior history of allergic reactions to egg and/or gelatin? no    Do you have a sensitivity to the preservative Thimersol? no    Do you have a past history of Guillan-Barre Syndrome? no    Do you currently have an acute febrile illness? no    Have you ever had a severe reaction to latex? no    Vaccine information given and explained to patient? yes    Are you currently pregnant? no    Lot Number:[id number]/30/10   Site Given Right Deltoid IM.Krystal Eaton (AAMA)  January 31, 2008 11:30 AM     PCP:  Olene Craven  Chief Complaint:  flu shot and "ears popping".  History of Present Illness: Ms. Quizon is a 55 y/o woman with deconditioning 2/2 chronic back pain, major depression, HTN, HLPD and   Trying to walk with her cane. She gets aching and swelling in her L ankle when she tries to walk. Constant back pain especially when she's standing on her feet. Had steroid injections x 3 in 2004 - without any success but this was in her lower back, not upper back. She also had a TENS unit that didn't hellp.  Still goes to pain clinic.    Current Allergies (reviewed today): ! CODEINE ! PENICILLIN ! MORPHINE    Risk Factors:  Tobacco use:  current    Cigarettes:  Yes --  1 pack(s) per day    Counseled to quit/cut down tobacco use:  yes Drug use:  no Alcohol use:  no Exercise:  no  Family History Risk Factors:    Family History of MI in females < 66 years old:  yes  Colonoscopy History:    Date of Last Colonoscopy:  03/22/2006  Mammogram History:    Date of Last Mammogram:  04/23/2007  PAP Smear History:    Date of Last PAP Smear:  06/20/2006   Review of Systems  General      Denies chills, fatigue, and fever.  Eyes      Denies blurring and double vision.  ENT      Denies ear discharge and earache.  CV      Denies chest pain or discomfort and fainting.  Resp      Denies cough, shortness of breath, sputum productive, and wheezing.  GI      Denies change in bowel habits, nausea, and vomiting.  GU      Denies dysuria and hematuria.  Derm  Complains of rash.      Denies poor wound healing.  Neuro      Denies falling down and poor balance.  Psych      Denies anxiety and depression.  Heme      Denies abnormal bruising and bleeding.   Physical Exam  General:     alert, well-developed, well-nourished, and well-hydrated.  Middle-aged woman in motorized wheelchair. Head:     Dry hair. Minimal desquamation along hairline.  Eyes:     vision grossly intact, pupils equal, pupils round, and pupils reactive to light.   Ears:     Not wearing her hearing aids during our encounter. Moderate cerumen impaction without evidence of external otitis. Nose:     no external deformity and no nasal discharge.   Mouth:     OP clear. MMM. Neck:     supple, full ROM, no masses, and no JVD.   Lungs:     normal respiratory effort, no intercostal retractions, no accessory muscle use, normal breath sounds, no crackles, and no wheezes.   Heart:     normal rate, regular rhythm, no murmur, no gallop, and no rub.   Abdomen:     soft, non-tender, normal bowel sounds, no distention, and no masses.   Msk:     TTP in trapezius muscles and  rhomboid muscles as well as cervical spine. Extremities:     Wearing her LLE brace. No edema/cyanosis or clubbing. Neurologic:     alert & oriented X3 and cranial nerves II-XII intact.   Skin:     Moderate nodular acne on cheeks. Cervical Nodes:     no anterior cervical adenopathy and no posterior cervical adenopathy.   Psych:     Oriented X3, memory intact for recent and remote, normally interactive, good eye contact, not anxious appearing, and not depressed appearing.      Impression & Recommendations:  Problem # 1:  BACK PAIN, UPPER (ICD-724.5) Pt has chronic lower back pain and had surgery in that region. However, she has never had much w/u for her upper back pain. Could be related to her position in her wheelchair or the way she uses her cane. Doesn't do any official PT.  Her updated medication list for this problem includes:    Cyclobenzaprine Hcl 10 Mg Tabs (Cyclobenzaprine hcl) .Marland Kitchen... Take 1 tablet by mouth three times a day    Ultram Er 100 Mg Tb24 (Tramadol hcl) .Marland Kitchen... Take one tablet 4 times daily.  Orders: Diagnostic X-Ray/Fluoroscopy (Diagnostic X-Ray/Flu)  Depending on what xrays show, might refer for facet injections (if she has degenerative changes in her cervical spine). That might help her get out of her wheelchair more.  Problem # 2:  CERUMEN IMPACTION, BILATERAL (ICD-380.4) No external otitis at this time.  Orders: Cerumen Impaction Removal (21308)   Problem # 3:  TOBACCO ABUSE (ICD-305.1)  Her updated medication list for this problem includes:    Nicotine 21 Mg/24hr Pt24 (Nicotine) .Marland Kitchen... Apply once daily for 6 weeks then follow by 14 mg/24h patch.    Nicotine 14 Mg/24hr Pt24 (Nicotine) .Marland Kitchen... Apply once daily for 6 weeks then follow by 7 mg/24h patch.    Nicotine 7 Mg/24hr Pt24 (Nicotine) .Marland Kitchen... Apply once daily for 6 weeks then stop.  Change to something covered by medicaid.   Problem # 4:  HYPERTENSION (ICD-401.9) Well controlled on current. Will check  a bmet today.  Her updated medication list for this problem includes:    Maxzide-25 37.5-25 Mg Tabs (  Triamterene-hctz) .Marland Kitchen... Take 1 tablet by mouth once a day  BP today: 124/83 Prior BP: 124/75 (05/17/2007)  Labs Reviewed: Creat: 0.95 (05/17/2007) Chol: 224 (04/30/2007)   HDL: 32 (04/30/2007)   LDL: 167 (04/30/2007)   TG: 127 (04/30/2007)   Problem # 5:  GERD (ICD-530.81) needs refills on omeprazole. No alarm symptoms.  Her updated medication list for this problem includes:    Omeprazole 20 Mg Cpdr (Omeprazole) .Marland Kitchen... Take 1 tablet by mouth two times a day   Problem # 6:  CONSTIPATION (ICD-564.00) needs refills - doing well with daily Miralax use.  Her updated medication list for this problem includes:    Miralax Powd (Polyethylene glycol 3350) .Marland Kitchen... Take 1 heaping teaspoon in one glass of liquid once daily   Problem # 7:  HYPERLIPIDEMIA (ICD-272.4) Needs a FLP to make sure her ldl is at goal.  Her updated medication list for this problem includes:    Lipitor 40 Mg Tabs (Atorvastatin calcium) .Marland Kitchen... Take one tablet daily.  Labs Reviewed: Chol: 224 (04/30/2007)   HDL: 32 (04/30/2007)   LDL: 167 (04/30/2007)   TG: 127 (04/30/2007) SGOT: 12 (04/30/2007)   SGPT: 9 (04/30/2007)  Orders: T-Comprehensive Metabolic Panel ([id number]) T-Lipid Profile ([id number])   Complete Medication List: 1)  Cyclobenzaprine Hcl 10 Mg Tabs (Cyclobenzaprine hcl) .... Take 1 tablet by mouth three times a day 2)  Maxzide-25 37.5-25 Mg Tabs (Triamterene-hctz) .... Take 1 tablet by mouth once a day 3)  Effexor Xr 150 Mg Cp24 (Venlafaxine hcl) .... Take 1 tablet by mouth once a day 4)  Nortriptyline Hcl 50 Mg Caps (Nortriptyline hcl) .... Take 1 tablet by mouth at bedtime 5)  Elidel 1 % Crea (Pimecrolimus) .... Apply to face two times a day 6)  Locoid 0.1 % Crea (Hydrocortisone butyrate) .... Apply to face two times a day 7)  Lipitor 40 Mg Tabs (Atorvastatin calcium) .... Take one tablet  daily. 8)  Miralax Powd (Polyethylene glycol 3350) .... Take 1 heaping teaspoon in one glass of liquid once daily 9)  Ultram Er 100 Mg Tb24 (Tramadol hcl) .... Take one tablet 4 times daily. 10)  Omeprazole 20 Mg Cpdr (Omeprazole) .... Take 1 tablet by mouth two times a day 11)  Nicotine 21 Mg/24hr Pt24 (Nicotine) .... Apply once daily for 6 weeks then follow by 14 mg/24h patch. 12)  Nicotine 14 Mg/24hr Pt24 (Nicotine) .... Apply once daily for 6 weeks then follow by 7 mg/24h patch. 13)  Nicotine 7 Mg/24hr Pt24 (Nicotine) .... Apply once daily for 6 weeks then stop.  Other Orders: Flu Vaccine 58yrs + (29562) Administration Flu vaccine (Z3086)   Patient Instructions: 1)  Make sure you get your back and neck xrays done. I will call you to let you know the results. 2)  I will call you to let you know the results of your blood work. 3)  I have refilled all your prescriptions. If something is not covered by Medicaid, call us back to let us know ! 4)  Please schedule a follow-up appointment in 1 year or before as needed.   Prescriptions: OMEPRAZOLE 20 MG CPDR (OMEPRAZOLE) Take 1 tablet by mouth two times a day  #60 x 11   Entered and Authorized by:   Olene Craven MD   Signed by:   Olene Craven MD on 01/31/2008   Method used:   Electronically to        [facility]* (retail)       [address]  [address] [place]  [address]       Ph: [phone] or [phone]       Fax: [phone]   RxID:   [id number] MIRALAX  POWD (POLYETHYLENE GLYCOL 3350) Take 1 heaping teaspoon in one glass of liquid once daily  #1 bottle x 11   Entered and Authorized by:   Olene Craven MD   Signed by:   Olene Craven MD on 01/31/2008   Method used:   Electronically to        [facility]* (retail)       [address] Felicita Gage [address] [place], [place]  [address]       Ph: [phone] or [phone]       Fax: [phone]   RxID:    [id number] MAXZIDE-25 37.5-25 MG TABS (TRIAMTERENE-HCTZ) Take 1 tablet by mouth once a day  #30 x 11   Entered and Authorized by:   Olene Craven MD   Signed by:   Olene Craven MD on 01/31/2008   Method used:   Electronically to        [facility]* (retail)       [address] [place] Gage [address] [place], [place]  [address]       Ph: [phone] or [phone]       Fax: [phone]   RxID:   [id number] NICOTINE 14 MG/24HR PT24 (NICOTINE) Apply once daily for 6 weeks then follow by 7 mg/24h patch.  #30 x 1   Entered and Authorized by:   Olene Craven MD   Signed by:   Olene Craven MD on 01/31/2008   Method used:   Electronically to        [facility]* (retail)       [address] [place] Gage [address] [place], [place]  [address]       Ph: [phone] or [phone]       Fax: [phone]   RxID:   [id number] NICOTINE 21 MG/24HR PT24 (NICOTINE) Apply once daily for 6 weeks then follow by 14 mg/24h patch.  #30 x 1   Entered and Authorized by:   Olene Craven MD   Signed by:   Olene Craven MD on 01/31/2008   Method used:   Electronically to        [facility]* (retail)       [address] [place] Gage [address] [place], [place]  [address]       Ph: [phone] or [phone]       Fax: [phone]   RxID:   [id number] NICOTINE 7 MG/24HR PT24 (NICOTINE) Apply once daily for 6 weeks then stop.  #30 x 1   Entered and Authorized by:   Olene Craven MD   Signed by:   Olene Craven MD on 01/31/2008   Method used:   Electronically to        [facility]* (retail)       [address] Felicit[place] Gage [address] [place], [place]  [address]       Ph: [phone] or [phone]  Fax: [phone]   RxID:   [id number] LOCOID 0.1 % CREA (HYDROCORTISONE BUTYRATE) Apply to face two times a day  #45 x 1   Entered and Authorized by:   Olene Craven MD   Signed by:   Olene Craven  MD on 01/31/2008   Method used:   Electronically to        [facility]* (retail)       [address] [place] Gage [address] [place], [place]  [address]       Ph: [phone] or [phone]       Fax: [phone]   RxID:   [id number]  ]

## 2010-05-24 NOTE — Assessment & Plan Note (Signed)
Summary: CHECKUP/ SB.   Vital Signs:  Patient Profile:   55 Years Old Female Height:     65 inches (165.10 cm) Weight:      161.4 pounds (73.36 kg) BMI:     26.96 Temp:     98.0 degrees F (36.67 degrees C) oral Pulse rate:   88 / minute BP sitting:   137 / 92  (right arm)  Pt. in pain?   no  Vitals Entered By: [name] RN (April 30, 2007 9:24 AM)              Is Patient Diabetic? No Nutritional Status BMI of 25 - 29 = overweight  Have you ever been in a relationship where you felt threatened, hurt or afraid?No   Does patient need assistance? Functional Status Cook/clean, Shopping, Social activities Ambulation Wheelchair Comments uses power chair     PCP:  Olene Craven  Chief Complaint:  PAP Smear and cholesterol check.  History of Present Illness: This is a 55 year old woman with past medical history of   Depression [facility] with Dr. 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 Terri Piedra Hearing loss from recurrent otitis with R hearing aid, followed by ENT Recurrent otitis externa Tobacco abuse Hx of recurrent yeast vaginitis and Trichomonas  Here for pap smear, last one was 2/08 and results were normal.  She reports having had abnormal paps in the past, but can not tell me how long it has been since the last abnormal.  She is one month early in her pap scheduling and I am concerned that medicare will not pay for the test, as she is low risk, has no symptoms, I am willing to delay the test to avoid a payment issue.  Reports that her ears feel stopped up.   Current Allergies: ! CODEINE ! PENICILLIN ! MORPHINE    Risk Factors:  Tobacco use:  current    Cigarettes:  Yes --  1/2 pack(s) per day    Counseled to quit/cut down tobacco use:  yes Drug use:  no Alcohol use:  no Exercise:  no  Family History Risk Factors:    Family History of MI in females < 13 years old:  yes  Colonoscopy History:    Date of Last Colonoscopy:  03/22/2006  PAP Smear History:    Date of Last PAP Smear:  06/20/2006  Mammogram History:     Date of Last Mammogram:  04/23/2007    Results:  normal   Mammogram History:     Date of Last Mammogram:  04/22/2006    Results:  normal    Review of Systems       The patient complains of decreased hearing.  The patient denies anorexia, fever, weight loss, vision loss, chest pain, syncope, dyspnea on exhertion, and prolonged cough.         Complains only of stopped up ears.     Physical Exam  General:     alert and well-nourished.  wheel chair bound. Head:     normocephalic and atraumatic.   Eyes:     vision grossly intact, pupils equal, pupils round, and pupils reactive to light.   Ears:     hearing aid in R ear.  Both ears  have cerumen impaction. Nose:     no external deformity.   Mouth:     pharynx pink and moist.   Neck:     supple, full ROM, and no masses.   Chest Wall:     no deformities.   Lungs:     normal respiratory effort and normal breath sounds.   Heart:     normal rate, regular rhythm, and no murmur.   Abdomen:     soft, non-tender, and normal bowel sounds.   Msk:     no joint tenderness.  slight weakness in L leg on exam, but weakness is pronounced when moving the patient from the chair to the table.  Little control of the L leg. Pulses:     +1 weak pulses Extremities:     no edema Neurologic:     alert & oriented X3, cranial nerves II-XII intact, DTRs symmetrical and normal, and RLE weakness.   Skin:     turgor normal, color normal, and no suspicious lesions.   Cervical Nodes:     no anterior cervical adenopathy and no posterior cervical adenopathy.   Psych:     Oriented X3, normally  interactive, and good eye contact.      Impression & Recommendations:  Problem # 1:  HYPERLIPIDEMIA (ICD-272.4) Needs refill of lipitor, called in.  Needs routine labs: CMET and lipids.  She is fasting today, so will go a head and do.   Her updated medication list for this problem includes:    Lipitor 20 Mg Tabs (Atorvastatin calcium) .Marland Kitchen... Take 1 tablet by mouth once a day  Orders: T-Lipid Profile [id number]) T-Comprehensive Metabolic Panel [id number])  Labs Reviewed: Chol: 217 (06/27/2006)   HDL: 37 (06/27/2006)   LDL: 158 (06/27/2006)   TG: 108 (06/27/2006) SGOT: 14 (06/27/2006)   SGPT: 12 (06/27/2006)   Problem # 2:  GYNECOLOGICAL EXAMINATION, ROUTINE (ICD-V72.31) Pt has history of abnormal paps, no new partners and no new symptoms.  She is one month early for her routine pap, and I am concerned that she will run into a billing problem if we do one today.  Last pap 2/08 was normal, there are no other results in echart. Will reschedule for later date.  Problem # 3:  HYPERTENSION (ICD-401.9) Doing well on current regimen.  Needs refills, called in.   Her updated medication list for this problem includes:    Maxzide-25 37.5-25 Mg Tabs (Triamterene-hctz) .Marland Kitchen... Take 1 tablet by mouth once a day  BP today: 137/92 Prior BP: 155/94 (10/17/2006)  Labs Reviewed: Creat: 0.80 (06/27/2006) Chol: 217 (06/27/2006)   HDL: 37 (06/27/2006)   LDL: 158 (06/27/2006)   TG: 108 (06/27/2006)   Problem # 4:  CERUMEN IMPACTION, BILATERAL (ICD-380.4) Both ears completely occluded.  Cerumen removed by Glenda. Pt felt she could hear better after removal.  Complete Medication List: 1)  Cyclobenzaprine Hcl 10 Mg Tabs (Cyclobenzaprine hcl) .... Take 1 tablet by mouth three times a day 2)  Neurontin 600 Mg Tabs (Gabapentin) .... Take 1 tablet by mouth three times a day 3)  Maxzide-25 37.5-25 Mg Tabs (Triamterene-hctz) .... Take 1 tablet by mouth once a day 4)  Effexor Xr 150 Mg Cp24  (Venlafaxine hcl) .... Take 1 tablet by mouth once a day 5)  Nortriptyline Hcl 50 Mg Caps (Nortriptyline hcl) .... Take 1 tablet by mouth at bedtime 6)  Elidel 1 % Crea (Pimecrolimus) .... Apply to face two times a day 7)  Locoid 0.1 % Crea (Hydrocortisone butyrate) .Marland KitchenMarland KitchenMarland Kitchen  Apply to face two times a day 8)  Lipitor 20 Mg Tabs (Atorvastatin calcium) .... Take 1 tablet by mouth once a day 9)  Miralax Powd (Polyethylene glycol 3350) .... Take 1 heaping teaspoon in one glass of liquid once daily 10)  Lactulose 10 Gm/41ml Soln (Lactulose) .... Take 15-79ml each day, titrate for 1 bowel movement each day 11)  Guai 800mg /dm 30mg  800-30 Mg Tb12 (Dextromethorphan-guaifenesin) .... Take about 10mg  by mouth every 6 hours as needed   Patient Instructions: 1)  Please schedule a follow-up appointment in 3 months for a pap smear. 2)  You will have labs drawn today, we will call you if there is anything that needs to be adressed before your appointment.    Prescriptions: MAXZIDE-25 37.5-25 MG TABS (TRIAMTERENE-HCTZ) Take 1 tablet by mouth once a day  #31 x 11   Entered and Authorized by:   Elby Showers MD   Signed by:   Elby Showers MD on 04/30/2007   Method used:   Print then Give to Patient   RxID:   [id number] LIPITOR 20 MG TABS (ATORVASTATIN CALCIUM) Take 1 tablet by mouth once a day  #31 x 11   Entered and Authorized by:   Elby Showers MD   Signed by:   Elby Showers MD on 04/30/2007   Method used:   Print then Give to Patient   RxID:   [email or web address]  ]  Impression & Recommendations:  Problem # 1:  HYPERLIPIDEMIA (ICD-272.4) Needs refill of lipitor, called in.  Needs routine labs: CMET and lipids.  She is fasting today, so will go a head and do.   Her updated medication list for this problem includes:    Lipitor 20 Mg Tabs (Atorvastatin calcium) .Marland Kitchen... Take 1 tablet by mouth once a day  Orders: T-Lipid Profile [id number]) T-Comprehensive Metabolic Panel  ([id number])  Labs Reviewed: Chol: 217 (06/27/2006)   HDL: 37 (06/27/2006)   LDL: 158 (06/27/2006)   TG: 108 (06/27/2006) SGOT: 14 (06/27/2006)   SGPT: 12 (06/27/2006)   Problem # 2:  GYNECOLOGICAL EXAMINATION, ROUTINE (ICD-V72.31) Pt has history of abnormal paps, no new partners and no new symptoms.  She is one month early for her routine pap, and I am concerned that she will run into a billing problem if we do one today.  Last pap 2/08 was normal, there are no other results in echart. Will reschedule for later date.  Problem # 3:  HYPERTENSION (ICD-401.9) Doing well on current regimen.  Needs refills, called in.   Her updated medication list for this problem includes:    Maxzide-25 37.5-25 Mg Tabs (Triamterene-hctz) .Marland Kitchen... Take 1 tablet by mouth once a day  BP today: 137/92 Prior BP: 155/94 (10/17/2006)  Labs Reviewed: Creat: 0.80 (06/27/2006) Chol: 217 (06/27/2006)   HDL: 37 (06/27/2006)   LDL: 158 (06/27/2006)   TG: 108 (06/27/2006)   Problem # 4:  CERUMEN IMPACTION, BILATERAL (ICD-380.4) Both ears completely occluded.  Cerumen removed by Glenda. Pt felt she could hear better after removal.  Complete Medication List: 1)  Cyclobenzaprine Hcl 10 Mg Tabs (Cyclobenzaprine hcl) .... Take 1 tablet by mouth three times a day 2)  Neurontin 600 Mg Tabs (Gabapentin) .... Take 1 tablet by mouth three times a day 3)  Maxzide-25 37.5-25 Mg Tabs (Triamterene-hctz) .... Take 1 tablet by mouth once a day 4)  Effexor Xr 150 Mg Cp24 (Venlafaxine hcl) .... Take 1 tablet by mouth once a day 5)  Nortriptyline Hcl 50 Mg Caps (Nortriptyline hcl) .Marland KitchenMarland KitchenMarland Kitchen  Take 1 tablet by mouth at bedtime 6)  Elidel 1 % Crea (Pimecrolimus) .... Apply to face two times a day 7)  Locoid 0.1 % Crea (Hydrocortisone butyrate) .... Apply to face two times a day 8)  Lipitor 20 Mg Tabs (Atorvastatin calcium) .... Take 1 tablet by mouth once a day 9)  Miralax Powd (Polyethylene glycol 3350) .... Take 1 heaping teaspoon in one  glass of liquid once daily 10)  Lactulose 10 Gm/66ml Soln (Lactulose) .... Take 15-72ml each day, titrate for 1 bowel movement each day 11)  Guai 800mg /dm 30mg  800-30 Mg Tb12 (Dextromethorphan-guaifenesin) .... Take about 10mg  by mouth every 6 hours as needed

## 2010-05-24 NOTE — Assessment & Plan Note (Signed)
Summary: CHECKUP/SB.   Vital Signs:  Patient profile:   55 year old female Height:      65 inches Weight:      163.7 pounds BMI:     27.34 Temp:     97.7 degrees F oral Pulse rate:   80 / minute BP sitting:   100 / 80  (right arm)  Vitals Entered By: Filomena Jungling NT II (Sep 06, 2009 10:15 AM) CC: LEFT EAR PAIN , Depression Is Patient Diabetic? No Pain Assessment Patient in pain? yes     Location: EAR Intensity: 8 Type: aching Onset of pain  2 WEEKS  Does patient need assistance? Functional Status Self care Ambulation Normal   Primary Care Provider:  Olene Craven  CC:  LEFT EAR PAIN  and Depression.  History of Present Illness: 55yof with pmh outlined in chart who is here with left ear pain which started 2 weeks ago.  No drainage, fevers or chills.  Also with pain in the left lower back with burning pain shooting down the back of the leg for 2 weeks.  Has been taking her muscle relaxers and pain pills but they are not helping.  Hurts all the time.  Has constant weakness in LE's L>R.  No bowel or bladder incontinece.  No fevers, chills, weight change.  Depression History:      The patient denies a depressed mood most of the day and a diminished interest in her usual daily activities.         Preventive Screening-Counseling & Management  Alcohol-Tobacco     Smoking Status: current     Smoking Cessation Counseling: yes     Packs/Day: 1  Caffeine-Diet-Exercise     Does Patient Exercise: no  Current Medications (verified): 1)  Cyclobenzaprine Hcl 10 Mg Tabs (Cyclobenzaprine Hcl) .... Take 1 Tablet By Mouth Three Times A Day 2)  Maxzide-25 37.5-25 Mg Tabs (Triamterene-Hctz) .... Take 1 Tablet By Mouth Once A Day 3)  Effexor Xr 150 Mg Cp24 (Venlafaxine Hcl) .... Take 1 Tablet By Mouth Once A Day 4)  Nortriptyline Hcl 50 Mg Caps (Nortriptyline Hcl) .... Take 1 Tablet By Mouth At Bedtime 5)  Locoid 0.1 % Crea (Hydrocortisone Butyrate) .... Apply To Face Two Times A  Day 6)  Lipitor 40 Mg  Tabs (Atorvastatin Calcium) .... Take One Tablet Daily. 7)  Ultram Er 100 Mg  Tb24 (Tramadol Hcl) .... Take One Tablet 4 Times Daily. 8)  Omeprazole 20 Mg Cpdr (Omeprazole) .... Take 1 Tablet By Mouth Two Times A Day 9)  Flonase 50 Mcg/act Susp (Fluticasone Propionate) .... Two Sprays Per Nostril Daily 10)  Miralax  Powd (Polyethylene Glycol 3350) .... Take 17g Daily in Liquid For Constipation.  Allergies: 1)  ! Codeine 2)  ! Penicillin 3)  ! Morphine 4)  ! Oxycontin (Oxycodone Hcl)  Review of Systems       perr hpi  Physical Exam  General:  alert and well-developed.   Head:  normocephalic and atraumatic.   Ears:  there is irratation of the left canal without exudate, pain with exam and movement of the ear. TM is clear and unaffected. R ear is normal. Mouth:  pharynx pink and moist, no erythema, no exudates, and no posterior lymphoid hypertrophy.   Lungs:  normal respiratory effort and normal breath sounds.   Heart:  normal rate, regular rhythm, no murmur, and no gallop.   Msk:  LE strength is 4/5 bilaterally, this is normal for her.  There  is pain with extension of the left leg in a seated position (kicking straight out) which reproduces her complaint today.  Pulses:  +1 Extremities:  no edema Neurologic:  alert & oriented X3 and cranial nerves II-XII intact.   Cervical Nodes:  no anterior cervical adenopathy and no posterior cervical adenopathy.   Psych:  Oriented X3, memory intact for recent and remote, normally interactive, good eye contact, and not anxious appearing.     Impression & Recommendations:  Problem # 1:  HYPERTENSION (ICD-401.9) BP is great today. no change.  Her updated medication list for this problem includes:    Maxzide-25 37.5-25 Mg Tabs (Triamterene-hctz) .Marland Kitchen... Take 1 tablet by mouth once a day  BP today: 100/80 Prior BP: 147/105 (03/25/2009)  Labs Reviewed: K+: 3.9 (08/31/2008) Creat: : 0.77 (08/31/2008)   Chol: 136  (08/31/2008)   HDL: 24 (08/31/2008)   LDL: 77 (08/31/2008)   TG: 177 (08/31/2008)  Problem # 2:  EXTERNAL OTITIS (ICD-380.10)  I think she may have a very mild external otitis in the left canal.  Will treat with ciprodex drops.  Her updated medication list for this problem includes:    Cipro Hc 0.2-1 % Susp (Ciprofloxacin-hydrocortisone) ..... Use three drops in left ear two times a day for two weeks.  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  Looking back on her old MRI from 6/05 she did have a L4/5 disc bulge and this may be the problem today.  She is wheelchair bound which makes rehab difficult.  She has been on Flexaryl and tramadol which are not working to control pain.  She does not tolerate oxycontin or morphine.  Will try vicodin for pain control.  Will refer to [facility] for a rehab referal as usual therapy would be to advise continued mobility and she is not mobile.  Concerned about nerve impingement by bulging disc as well as spasticity in the hip and lower back.   Also wants a home health aid through "one at at time".  I will ask DT if she has heard of them and could help with this.  Orders: Pain Clinic Referral (Pain) Social Work Referral (Social )  Problem # 4:  HYPERLIPIDEMIA (ICD-272.4) She would like an anual lipid profile.  Will check lipids and cmet.  Her updated medication list for this problem includes:    Lipitor 40 Mg Tabs (Atorvastatin calcium) .Marland Kitchen... Take one tablet daily.  Orders: T-Lipid Profile ([id number])  Complete Medication List: 1)  Cyclobenzaprine Hcl 10 Mg Tabs (Cyclobenzaprine hcl) .... Take 1 tablet by mouth three times a day 2)  Maxzide-25 37.5-25 Mg Tabs (Triamterene-hctz) .... Take 1 tablet by mouth once a day 3)  Effexor Xr 150 Mg Cp24 (Venlafaxine hcl) .... Take 1 tablet by mouth once a day 4)  Nortriptyline Hcl 50 Mg Caps (Nortriptyline hcl) .... Take 1 tablet by mouth at bedtime 5)  Locoid 0.1 %  Crea (Hydrocortisone butyrate) .... Apply to face two times a day 6)  Lipitor 40 Mg Tabs (Atorvastatin calcium) .... Take one tablet daily. 7)  Ultram Er 100 Mg Tb24 (Tramadol hcl) .... Take one tablet 4 times daily. 8)  Omeprazole 20 Mg Cpdr (Omeprazole) .... Take 1 tablet by mouth two times a day 9)  Flonase 50 Mcg/act Susp (Fluticasone propionate) .... Two sprays per nostril daily 10)  Miralax Powd (Polyethylene glycol 3350) .... Take 17g daily in liquid for constipation. 11)  Cipro Hc 0.2-1 %  Susp (Ciprofloxacin-hydrocortisone) .... Use three drops in left ear two times a day for two weeks. 12)  Vicodin 5-500 Mg Tabs (Hydrocodone-acetaminophen) .... Take one tablet every 8 hours as needed for pain.  Other Orders: T-Comprehensive Metabolic Panel [phone])  Patient Instructions: 1)  You will be refered to a rehabilitation doctor for your leg and back pain. 2)  You have a new medication for leg and back pain. 3)  You have a new prescription for ear drops for your ear pain. 4)  You had labwork done today, we will call you if there is anything that needs to be discussed before your next appointment. 5)  Please schedule a follow-up appointment in 2 weeks. Prescriptions: CIPRO HC 0.2-1 % SUSP (CIPROFLOXACIN-HYDROCORTISONE) Use three drops in left ear two times a day for two weeks.  #1 bottle x 0   Entered and Authorized by:   Elby Showers MD   Signed by:   Elby Showers MD on 09/06/2009   Method used:   Print then Give to Patient   RxID:   [id number] VICODIN 5-500 MG TABS (HYDROCODONE-ACETAMINOPHEN) Take one tablet every 8 hours as needed for pain.  #60 x 0   Entered and Authorized by:   Elby Showers MD   Signed by:   Elby Showers MD on 09/06/2009   Method used:   Print then Give to Patient   RxID:   [id number]   Prevention & Chronic Care Immunizations   Influenza vaccine: Fluvax 3+  (01/27/2009)    Tetanus booster: Not documented    Pneumococcal vaccine:  Historical  (02/17/2005)  Colorectal Screening   Hemoccult: Negative  (02/25/2005)    Colonoscopy: Refused  (03/22/2006)   Colonoscopy action/deferral: GI referral  (09/06/2009)  Other Screening   Pap smear: Normal  (06/20/2006)   Pap smear action/deferral: PAP smear done  (06/20/2006)    Mammogram: ASSESSMENT: Negative - BI-RADS 1^MM DIGITAL SCREENING  (05/31/2009)   Mammogram due: 04/2010   Smoking status: current  (09/06/2009)   Smoking cessation counseling: yes  (09/06/2009)  Lipids   Total Cholesterol: 136  (08/31/2008)   Lipid panel action/deferral: Lipid Panel ordered   LDL: 77  (08/31/2008)   LDL Direct: Not documented   HDL: 24  (08/31/2008)   Triglycerides: 177  (08/31/2008)    SGOT (AST): 13  (08/31/2008)   BMP action: Ordered   SGPT (ALT): 12  (08/31/2008) CMP ordered    Alkaline phosphatase: 77  (08/31/2008)   Total bilirubin: 0.3  (08/31/2008)    Lipid flowsheet reviewed?: Yes   Progress toward LDL goal: At goal  Hypertension   Last Blood Pressure: 100 / 80  (09/06/2009)   Serum creatinine: 0.77  (08/31/2008)   BMP action: Ordered   Serum potassium 3.9  (08/31/2008) CMP ordered    Progress toward BP goal: At goal  Self-Management Support :    Hypertension self-management support: Not documented    Lipid self-management support: Not documented    Nursing Instructions: GI referral for screening colonoscopy (see order)     Process Orders Check Orders Results:     [facility]: Check successful Tests Sent for requisitioning (Sep 06, 2009 12:40 PM):     09/06/2009: [facility] -- T-Comprehensive Metabolic Panel [[id number]] (signed)     09/06/2009: [facility] -- T-Lipid Profile [id number] (signed)

## 2010-05-24 NOTE — Letter (Signed)
Summary: PHYSICIAN VERBAL ORDER  PHYSICIAN VERBAL ORDER   Imported By: Margie Billet 07/12/2009 11:02:56  _____________________________________________________________________  External Attachment:    Type:   Image     Comment:   External Document

## 2010-05-24 NOTE — Consult Note (Signed)
Summary: [facility]  [facility]   Imported By: Florinda Marker 05/19/2008 11:11:23  _____________________________________________________________________  External Attachment:    Type:   Image     Comment:   External Document  Appended Document: [facility] No new medications ordered; she was encouraged to take her analgesics on a regular basis (which pt was reportedly not doing).

## 2010-05-24 NOTE — Progress Notes (Signed)
Summary: Social Work  Programme researcher, broadcasting/film/video of Call: Made appmt with [facility] thursday at 10:00  (june 23rd) for smoking cessation counseling.   Follow-up for Phone Call        Miranda Gaines is no show for her smoking cessation appmt today.   Dorothe Pea  October 14, 2009 11:14 AM

## 2010-05-24 NOTE — Assessment & Plan Note (Signed)
Summary: low bp/gg   Vital Signs:  Patient Profile:   55 Years Old Female Height:     65 inches (165.10 cm) Temp:     97.6 degrees F oral Pulse rate:   116 / minute BP sitting:   141 / 101  (right arm)  Vitals Entered By: Miranda Gaines (May 06, 2007 11:28 AM)              Does patient need assistance? Functional Status Self care Ambulation Impaired:Risk for fall     PCP:  Olene Craven  Chief Complaint:  CHEST HURTS, FEELS LIKE FOOD STUCK IN THROAT, and GAS.  History of Present Illness: Miranda Gaines is a 55 yr female who presents with a 3 days history of dysphagia. She has been having difficulty swallowing solids for the last 3 days. She experiences pain in the chest and feels that the food does not go down. She also feels that she has a gas build up.She ia able to swallow liquids. She has episodes of vomiting and retching after she tries to swallow solid food. She has had problems with reflux and gas that was treated in the past.She denies eating chicken with a bone or fish recently. She was seen in the mental health center and was noted to have similar symptoms and also an irregular HR and was sent to the [facility]   Prior Medications :  CYCLOBENZAPRINE HCL 10 MG TABS (CYCLOBENZAPRINE HCL) Take 1 tablet by mouth three times a day NEURONTIN 600 MG TABS (GABAPENTIN) Take 1 tablet by mouth three times a day MAXZIDE-25 37.5-25 MG TABS (TRIAMTERENE-HCTZ) Take 1 tablet by mouth once a day EFFEXOR XR 150 MG CP24 (VENLAFAXINE HCL) Take 1 tablet by mouth once a day NORTRIPTYLINE HCL 50 MG CAPS (NORTRIPTYLINE HCL) Take 1 tablet by mouth at bedtime ELIDEL 1 % CREA (PIMECROLIMUS) Apply to face two times a day LOCOID 0.1 % CREA (HYDROCORTISONE BUTYRATE) Apply to face two times a day LIPITOR 20 MG TABS (ATORVASTATIN CALCIUM) Take 1 tablet by mouth once a day MIRALAX  POWD (POLYETHYLENE GLYCOL 3350) Take 1 heaping teaspoon in one glass of liquid once daily LACTULOSE 10 GM/15ML SOLN  (LACTULOSE) Take 15-47ml each day, titrate for 1 bowel movement each day GUAI 800MG /DM 30MG  800-30 MG  TB12 (DEXTROMETHORPHAN-GUAIFENESIN) Take about 10mg  by mouth every 6 hours as needed    Current Allergies: ! CODEINE ! PENICILLIN ! MORPHINE  Past Medical History:    Reviewed history from 07/04/2006 and no changes required:       Depression follows with Dr. 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 Terri Piedra       Hearing loss from recurrent otitis with R hearing aid, followed by ENT       Recurrent otitis externa       Tobacco abuse       Hx of recurrent yeast vaginitis and Trichomonas   Family History:    Reviewed history from 05/04/2006 and no changes required:  Family History of CAD Female 1st degree relative <60, mother passed at 65       Family History Lung cancer, father passed at 28  Social History:    Reviewed history from 05/04/2006 and no changes required:       Divorced, no children       Disabled, wheelchair bound from chronic back pain       Lives alone in a house       Current Smoker       Alcohol use-no       Drug use-no       Menopaused in 2001       Last sexual activity in 2001     Physical Exam  General:     alert.   Head:     normocephalic.   Eyes:     vision grossly intact.   Nose:     no external deformity.   Mouth:     pharynx pink and moist, no erythema, no exudates, no posterior lymphoid hypertrophy, no postnasal drip, no lesions, no aphthous ulcers, no erosions, and no tongue abnormalities.   Neck:     supple.   Lungs:     normal respiratory effort, no intercostal retractions, and no accessory muscle use.     Heart:     no murmur, no gallop, no rub, no JVD, and tachycardia.   Abdomen:     soft, non-tender, normal bowel sounds, no distention, no masses, and no guarding.   Msk:     normal ROM.   Neurologic:     alert & oriented X3, cranial nerves II-XII intact, strength normal in all extremities, sensation intact to light touch, and sensation intact to pinprick.      Impression & Recommendations:  Problem # 1:  DYSPHAGIA UNSPECIFIED (ICD-787.20) The symptoms have been present for the last 3 days and are acure in onset. She has associated chest pain and dificulty swallowing solids. She also has been vomiting.She does not recall eating chicken or fish or other food with bone. She has been treated with dyspepsia in the past. The possible causes are foreign body, peptic stricture, esophageal ring. She will be admitted, kept NPO and a GI evaluation will be obtained.  Problem # 2:  SINUS TACHYCARDIA (ICD-427.89) An EKG was obtained which showed sinus tachycardia. She is not orthostatic but her HR does increase from lying to standing. She will be admitted to telemetry to monitor and she will be given fluids  Complete Medication List: 1)  Cyclobenzaprine Hcl 10 Mg Tabs (Cyclobenzaprine hcl) .... Take 1 tablet by mouth three times a day 2)  Neurontin 600 Mg Tabs (Gabapentin) .... Take 1 tablet by mouth three times a day 3)  Maxzide-25 37.5-25 Mg Tabs (Triamterene-hctz) .... Take 1 tablet by mouth once a day 4)  Effexor Xr 150 Mg Cp24 (Venlafaxine hcl) .... Take 1 tablet by mouth once a day 5)  Nortriptyline Hcl 50 Mg Caps (Nortriptyline hcl) .... Take 1 tablet by mouth at bedtime 6)  Elidel 1 % Crea (Pimecrolimus) .... Apply to face two times a day 7)  Locoid 0.1 % Crea (Hydrocortisone butyrate) .... Apply to face two times a day 8)  Lipitor 20 Mg Tabs (Atorvastatin calcium) .... Take 1 tablet by mouth once a day 9)  Miralax Powd (Polyethylene glycol 3350) .... Take 1 heaping teaspoon in one glass of  liquid once daily 10)  Lactulose 10 Gm/55ml Soln (Lactulose) .... Take 15-55ml each day, titrate for  1 bowel movement each day 11)  Guai 800mg /dm 30mg  800-30 Mg Tb12 (Dextromethorphan-guaifenesin) .... Take about 10mg  by mouth every 6 hours as needed   Patient Instructions: 1)  ADMIT    ]  Appended Document: low bp/gg 120/100 PULSE100 LYING SITTING 120/100 PULSE 90 STANDING 140/100 PULSE 130  Appended Document: low bp/gg NP at [facility] referred pt to her PCP because she thought the patient's pulse was more rapid and weaker than is "normal" for pt. Pt's B/P sitting was 98/76 sitting and 100/78 standing. Her pulse at [facility] was 160. Pt wears a hearing aid in her right ear and brace on her left lower leg and foot. She can walk with a cane and asst but relies on a w/c to walk any distance.  IV started with 22G IW to  left hand with excellent blood return and site WNL, dressing dry and in tact. 1000 CC D51/2NS started at 150 cc/hr at 12:35 P.     Report called to 3700, and pt in stable condition: and pt will be transferred via w/c  to 3739, Bed 1, as soon as room cleaning completed. Dorie Rank, RN, 05/06/07, 1:05 P

## 2010-05-24 NOTE — Miscellaneous (Signed)
Summary: [facility]:PCS  [facility]:PCS   Imported By: Florinda Marker 06/10/2007 13:52:15  _____________________________________________________________________  External Attachment:    Type:   Image     Comment:   External Document

## 2010-05-24 NOTE — Miscellaneous (Signed)
Summary: [facility]: Physician Verbal Order  [facility]: Physician Verbal Order   Imported By: Florinda Marker 09/15/2008 14:27:47  _____________________________________________________________________  External Attachment:    Type:   Image     Comment:   External Document

## 2010-05-24 NOTE — Miscellaneous (Signed)
Summary: [facility]: Verbal  Order  [facility]: Verbal  Order   Imported By: Florinda Marker 09/10/2008 14:40:00  _____________________________________________________________________  External Attachment:    Type:   Image     Comment:   External Document

## 2010-05-24 NOTE — Miscellaneous (Signed)
Summary: [facility]: Verbal Order  [facility]: Verbal Order   Imported By: Florinda Marker 09/23/2008 15:34:09  _____________________________________________________________________  External Attachment:    Type:   Image     Comment:   External Document

## 2010-05-24 NOTE — Letter (Signed)
Summary: Handout Printed  Printed Handout:  - *Patient Instructions 

## 2010-05-24 NOTE — Consult Note (Signed)
Summary: [facility]  [facility]   Imported By: Florinda Marker 06/04/2008 11:08:10  _____________________________________________________________________  External Attachment:    Type:   Image     Comment:   External Document  Appended Document: [facility] Severe profound bilateral mixed hearing loss. Seen by Dr. Serena Colonel from [facility] on 06/02/2008

## 2010-05-24 NOTE — Assessment & Plan Note (Signed)
Summary: EST-BAD COLD RESCH FROM 04/10/2008/CFB   Vital Signs:  Patient Profile:   55 Years Old Female Height:     65 inches (165.10 cm) Weight:      155.7 pounds (70.77 kg) BMI:     26.00 Temp:     97.9 degrees F (36.61 degrees C) oral Pulse rate:   77 / minute BP sitting:   111 / 71  (right arm)  Pt. in pain?   yes    Location:   chest and  ears    Intensity:   8  Vitals Entered By: Stanton Kidney Ditzler RN (April 15, 2008 1:42 PM)              Is Patient Diabetic? No Nutritional Status BMI of 25 - 29 = overweight Nutritional Status Detail appetite fair  Have you ever been in a relationship where you felt threatened, hurt or afraid?denies   Does patient need assistance? Functional Status Self care Ambulation Wheelchair     PCP:  Olene Craven  Chief Complaint:  Has chest and head congestion - green productive cough. Ears popping. Discuss x-rays.Marland Kitchen  History of Present Illness: 68 yow with pmh of HTN, HLD, GERD and Depression who is here today with complaints of cough productive of green sputum.  She was seen a month ago for sinus infection and was given a prescription for doxy and fluticasone nasal spray.  The cough developed while she was on doxy.  No fevers.  Her ears hurt and pop. Throat is itching. no myalgias. no n/v/d.  her chest hurts all over when she cough, and her lower back hurts too. no significant nasal discharge. no sick contacts. she got a flu shot. she had a pneumococcal vaccine a few years ago.       Prior Medications Reviewed Using: Patient Recall  Updated Prior Medication List: CYCLOBENZAPRINE HCL 10 MG TABS (CYCLOBENZAPRINE HCL) Take 1 tablet by mouth three times a day MAXZIDE-25 37.5-25 MG TABS (TRIAMTERENE-HCTZ) Take 1 tablet by mouth once a day EFFEXOR XR 150 MG CP24 (VENLAFAXINE HCL) Take 1 tablet by mouth once a day NORTRIPTYLINE HCL 50 MG CAPS (NORTRIPTYLINE HCL) Take 1 tablet by mouth at bedtime ELIDEL 1 % CREA (PIMECROLIMUS) Apply to face two  times a day LOCOID 0.1 % CREA (HYDROCORTISONE BUTYRATE) Apply to face two times a day LIPITOR 40 MG  TABS (ATORVASTATIN CALCIUM) Take one tablet daily. MIRALAX  POWD (POLYETHYLENE GLYCOL 3350) Take 1 heaping teaspoon in one glass of liquid once daily ULTRAM ER 100 MG  TB24 (TRAMADOL HCL) Take one tablet 4 times daily. OMEPRAZOLE 20 MG CPDR (OMEPRAZOLE) Take 1 tablet by mouth two times a day NICOTINE 21 MG/24HR PT24 (NICOTINE) Apply once daily for 6 weeks then follow by 14 mg/24h patch. NICOTINE 14 MG/24HR PT24 (NICOTINE) Apply once daily for 6 weeks then follow by 7 mg/24h patch. NICOTINE 7 MG/24HR PT24 (NICOTINE) Apply once daily for 6 weeks then stop. FLONASE 50 MCG/ACT SUSP (FLUTICASONE PROPIONATE) take 2 sorays in each nostril once daily for 4 days, and then one spray in each nostril once daily for 10 days DOXYCYCLINE HYCLATE 100 MG TABS (DOXYCYCLINE HYCLATE) Take 1 tablet by mouth two times a day  Current Allergies (reviewed today): ! CODEINE ! PENICILLIN ! MORPHINE   Social History:    Divorced, no children    Disabled, wheelchair bound from chronic back pain 2/2 workplace injury in her 85's.  She used to be a Psychologist, occupational.    Lives alone in  a house    Current Smoker    Alcohol use-no    Drug use-no    Menopaused in 2001    Last sexual activity in 2001   Risk Factors: Tobacco use:  current    Cigarettes:  Yes -- 1 pack(s) per day Drug use:  no Alcohol use:  no Exercise:  no Seatbelt use:  100 %  Family History Risk Factors:    Family History of MI in females < 50 years old:  yes  Colonoscopy History:    Date of Last Colonoscopy:  03/22/2006  Mammogram History:    Date of Last Mammogram:  04/23/2007  PAP Smear History:    Date of Last PAP Smear:  06/20/2006   Review of Systems       per hpi   Physical Exam  General:     alert and overweight-appearing.   Head:     normocephalic and atraumatic.   Eyes:     vision grossly intact, pupils equal, pupils round,  pupils reactive to light, and no injection.   Ears:     R ear normal and L ear normal.  TM's clear.  L TM is scarred. Nose:     no external deformity and mucosal erythema.   Mouth:     pharynx pink and moist, pharyngeal erythema, and postnasal drip.   Lungs:     normal respiratory effort and L base crackles.   Heart:     normal rate, regular rhythm, and no murmur.   Abdomen:     soft, non-tender, and normal bowel sounds.   Psych:     Oriented X3, memory intact for recent and remote, normally interactive, and good eye contact.      Impression & Recommendations:  Problem # 1:  ACUTE FRONTAL SINUSITIS (ICD-461.1) Ms. Spada continues to have sinusitis and now has a cough.  I think the cough is mostly from post nasal drip. Her cough developed on doxy and flonase has not helped at all.  I will start claritin, amoxicillin, and guafenacin-with codiene.  She should rtc in a week if symptoms do not improve.  The following medications were removed from the medication list:    Flonase 50 Mcg/act Susp (Fluticasone propionate) .Marland Kitchen... Take 2 sorays in each nostril once daily for 4 days, and then one spray in each nostril once daily for 10 days    Doxycycline Hyclate 100 Mg Tabs (Doxycycline hyclate) .Marland Kitchen... Take 1 tablet by mouth two times a day  Her updated medication list for this problem includes:    Amoxicillin 875 Mg Tabs (Amoxicillin) .Marland Kitchen... Take one tablet two times a day for 10 days.    Brontex 10-300 Mg Tabs (Guaifenesin-codeine) .Marland Kitchen... Take one tablet evey 4-6 hours for cough.   Problem # 2:  DEGENERATIVE DISC DISEASE, CERVICAL SPINE (ICD-722.4) She complains of a burning sensation over her upper back for the past several years.  She had c-spine xrays in 10/09 which show DJD and some cervical spurring, but no specific foraminal narrowing.  She would like her xrays faxed to the pain clnic she attends.  We will do this today.  Problem # 3:  TOBACCO ABUSE (ICD-305.1) She continues to smoke  1ppd.  we discussed cessation, even short term while she gets over this cold. She has a prescription for nicotine patch, but has not been using it.  She does not seem ready to quit.  Her updated medication list for this problem includes:    Nicotine 21  Mg/24hr Pt24 (Nicotine) .Marland Kitchen... Apply once daily for 6 weeks then follow by 14 mg/24h patch.    Nicotine 14 Mg/24hr Pt24 (Nicotine) .Marland Kitchen... Apply once daily for 6 weeks then follow by 7 mg/24h patch.    Nicotine 7 Mg/24hr Pt24 (Nicotine) .Marland Kitchen... Apply once daily for 6 weeks then stop.   Problem # 4:  HYPERTENSION (ICD-401.9) BP low today.    Her updated medication list for this problem includes:    Maxzide-25 37.5-25 Mg Tabs (Triamterene-hctz) .Marland Kitchen... Take 1 tablet by mouth once a day  BP today: 111/71 Prior BP: 128/78 (03/11/2008)  Labs Reviewed: Creat: 0.94 (01/31/2008) Chol: 143 (01/31/2008)   HDL: 32 (01/31/2008)   LDL: 85 (01/31/2008)   TG: 130 (01/31/2008)   Problem # 5:  HYPERLIPIDEMIA (ICD-272.4) Lipids in range at last check in 10/09.  Her updated medication list for this problem includes:    Lipitor 40 Mg Tabs (Atorvastatin calcium) .Marland Kitchen... Take one tablet daily.  Labs Reviewed: Chol: 143 (01/31/2008)   HDL: 32 (01/31/2008)   LDL: 85 (01/31/2008)   TG: 130 (01/31/2008) SGOT: 14 (01/31/2008)   SGPT: 11 (01/31/2008)   Complete Medication List: 1)  Cyclobenzaprine Hcl 10 Mg Tabs (Cyclobenzaprine hcl) .... Take 1 tablet by mouth three times a day 2)  Maxzide-25 37.5-25 Mg Tabs (Triamterene-hctz) .... Take 1 tablet by mouth once a day 3)  Effexor Xr 150 Mg Cp24 (Venlafaxine hcl) .... Take 1 tablet by mouth once a day 4)  Nortriptyline Hcl 50 Mg Caps (Nortriptyline hcl) .... Take 1 tablet by mouth at bedtime 5)  Elidel 1 % Crea (Pimecrolimus) .... Apply to face two times a day 6)  Locoid 0.1 % Crea (Hydrocortisone butyrate) .... Apply to face two times a day 7)  Lipitor 40 Mg Tabs (Atorvastatin calcium) .... Take one tablet daily. 8)   Miralax Powd (Polyethylene glycol 3350) .... Take 1 heaping teaspoon in one glass of liquid once daily 9)  Ultram Er 100 Mg Tb24 (Tramadol hcl) .... Take one tablet 4 times daily. 10)  Omeprazole 20 Mg Cpdr (Omeprazole) .... Take 1 tablet by mouth two times a day 11)  Nicotine 21 Mg/24hr Pt24 (Nicotine) .... Apply once daily for 6 weeks then follow by 14 mg/24h patch. 12)  Nicotine 14 Mg/24hr Pt24 (Nicotine) .... Apply once daily for 6 weeks then follow by 7 mg/24h patch. 13)  Nicotine 7 Mg/24hr Pt24 (Nicotine) .... Apply once daily for 6 weeks then stop. 14)  Amoxicillin 875 Mg Tabs (Amoxicillin) .... Take one tablet two times a day for 10 days. 15)  Brontex 10-300 Mg Tabs (Guaifenesin-codeine) .... Take one tablet evey 4-6 hours for cough. 16)  Zyrtec Allergy 10 Mg Tabs (Cetirizine hcl) .... Take one tablet daily for congestion.   Patient Instructions: 1)  Please schedule a follow-up appointment in 6 months. 2)  You have a new prescription for antibiotics and a cough suppressant from your regular pharmacy. 3)  You should take cetirizine 10mg  daily. 4)  We will fax your xrays to the pain clinic.   Prescriptions: AMOXICILLIN 875 MG TABS (AMOXICILLIN) Take one tablet two times a day for 10 days.  #20 x 0   Entered and Authorized by:   Elby Showers MD   Signed by:   Elby Showers MD on 04/15/2008   Method used:   Printed then faxed to ...       [facility]* (retail)       [address]  [place], [place]  [address]       Ph: [phone] or [phone]       Fax: [phone]   RxID:   [id number] BRONTEX 10-300 MG TABS (GUAIFENESIN-CODEINE) Take one tablet evey 4-6 hours for cough.  #30 x 0   Entered and Authorized by:   Elby Showers MD   Signed by:   Elby Showers MD on 04/15/2008   Method used:   Printed then faxed to ...       [facility]* (retail)       [address]       [place], [place]   [address]       Ph: [phone] or [phone]       Fax: [phone]   RxID:   [id number] ZYRTEC ALLERGY 10 MG TABS (CETIRIZINE HCL) Take one tablet daily for congestion.  #32 x 0   Entered and Authorized by:   Elby Showers MD   Signed by:   Elby Showers MD on 04/15/2008   Method used:   Samples Given   RxID:   [id number]  ]  Appended Document: EST-BAD COLD RESCH FROM 04/10/2008/CFB Office notes faxed to Dr Hali Marry - [facility] [phone] (fax) per pt. Dr Clent Ridges aware. Also faxed 2 x-ray reports done 02/04/08 per Dr Clent Ridges.

## 2010-05-24 NOTE — Progress Notes (Signed)
Summary: refill/gg  Phone Note Refill Request  on March 30, 2009 12:03 PM  Refills Requested: Medication #1:  CYCLOBENZAPRINE HCL 10 MG TABS Take 1 tablet by mouth three times a day  Medication #2:  MAXZIDE-25 37.5-25 MG TABS Take 1 tablet by mouth once a day  Medication #3:  EFFEXOR XR 150 MG CP24 Take 1 tablet by mouth once a day  Medication #4:  NORTRIPTYLINE HCL 50 MG CAPS Take 1 tablet by mouth at bedtime Pt has changed pharmacy's and all meds need to be faxed into  [facility]   Method Requested: Fax to Local Pharmacy Initial call taken by: Merrie Roof RN,  March 30, 2009 12:03 PM  Follow-up for Phone Call        [place], I went through these prescription requests in the wrong order and did not see the note that they need to be faxed to teh new pharmacy before I signed them.  Can you call or fax them in to the new pharmacy.  Also, many of the medications on the list do not seem to be refilled by [place] in the past, so I did not refill them.  This includes effexor, cyclobenzaprine and a few others.  He needs to contact the prescribing physcian to get those refills.  Thanks. Santina Evans    Prescriptions: MAXZIDE-25 37.5-25 MG TABS (TRIAMTERENE-HCTZ) Take 1 tablet by mouth once a day  #30 x 11   Entered and Authorized by:   Elby Showers MD   Signed by:   Elby Showers MD on 03/30/2009   Method used:   Telephoned to ...       [facility] (retail)       [address]       [place], [place]  [address]       Ph: [phone]       Fax: [phone]   RxID:   [id number]

## 2010-05-24 NOTE — Consult Note (Signed)
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 Elnoria Howard. If no improvement, will need surgical consult for pyloroplasty.

## 2010-05-24 NOTE — Progress Notes (Signed)
  Phone Note Other Incoming   Call placed by: Marin Roberts RN,  October 17, 2006 11:54 AM Summary of Call: pt called c/o hoarseness, sinus pain wants to know what to take, offered an appt @ 1:30 today, pt declined, informed pt i cannot give medical advice, told to please try to work out a way to come for appt and that she may check w/ her pharm for suggestions. she is agreeable Initial call taken by: Marin Roberts RN,  October 17, 2006 12:06 PM

## 2010-05-24 NOTE — Progress Notes (Signed)
Summary: med rfill/gp  Phone Note Refill Request   Refills Requested: Medication #1:  MAXZIDE-25 37.5-25 MG TABS Take 1 tablet by mouth once a day  Medication #2:  OMEPRAZOLE 20 MG CPDR Take 1 tablet by mouth two times a day  Method Requested: Electronic Initial call taken by: [name] RN,  February 23, 2009 3:07 PM    Prescriptions: OMEPRAZOLE 20 MG CPDR (OMEPRAZOLE) Take 1 tablet by mouth two times a day  #60 x 11   Entered and Authorized by:   Elby Showers MD   Signed by:   Elby Showers MD on 02/23/2009   Method used:   Electronically to        [facility]* (retail)       [address] [place], [place]  [address]       Ph: [phone] or [phone]       Fax: [phone]   RxID:   [id number] MAXZIDE-25 37.5-25 MG TABS (TRIAMTERENE-HCTZ) Take 1 tablet by mouth once a day  #30 x 11   Entered and Authorized by:   Elby Showers MD   Signed by:   Elby Showers MD on 02/23/2009   Method used:   Electronically to        [facility]* (retail)       [address] [place], [place]  [address]       Ph: [phone] or [phone]       Fax: [phone]   RxID:   [id number]

## 2010-05-24 NOTE — Progress Notes (Signed)
  Phone Note Other Incoming   Summary of Call: pt now has called saying she will be here at 1:30 today, appt sch Initial call taken by: Marin Roberts RN,  October 17, 2006 12:10 PM

## 2010-05-24 NOTE — Progress Notes (Signed)
  Phone Note Refill Request Message from:  Pharmacy on February 04, 2008 4:48 PM  Refills Requested: Medication #1:  LOCOID 0.1 % CREA Apply to face two times a day  Method Requested: Call Initial call taken by: Angelina Ok RN,  February 04, 2008 4:48 PM      Prescriptions: LOCOID 0.1 % CREA (HYDROCORTISONE BUTYRATE) Apply to face two times a day  #45g x 1   Entered and Authorized by:   Olene Craven MD   Signed by:   Olene Craven MD on 02/07/2008   Method used:   Electronically to        [facility]* (retail)       [address] [place], [place]  [address]       Ph: [phone] or [phone]       Fax: [phone]   RxID:   [id number]

## 2010-05-24 NOTE — Assessment & Plan Note (Signed)
Summary: ACUTE-SINUS INFECTION/CFB([facility])   Vital Signs:  Patient profile:   54 year old female Height:      65 inches Weight:      159.9 pounds BMI:     26.70 Temp:     98.0 degrees F oral Pulse rate:   61 / minute BP sitting:   147 / 105  (right arm)  Vitals Entered By: Filomena Jungling NT II (March 25, 2009 3:17 PM) CC: headaches, Depression Is Patient Diabetic? No Pain Assessment Patient in pain? no       Does patient need assistance? Functional Status Self care Ambulation Impaired:Risk for fall, Wheelchair   Primary Care Provider:  Olene Craven  CC:  headaches and Depression.  History of Present Illness: 55 year old Past Medical History: MAJOR DEPRESSION   - follows with Dr. Cardell Peach from [facility] GERD HYPERLIPIDEMIA    - treated (goal LDL <130) HYPERTENSION    - treated and controlled CHRONIC LUMBAR PAIN    - 2nd to lumbar disk herniation s/p laminectomy L5-S1 1993 DECONDITIONING, wheelchair bound 2nd to chronic back pain BILIARY SLUDGE    - on [place] in 2005, normal HIDA scan SEBORRHEIC DERMATITIS of face    - sees Dr. 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 She denies fever, cough. She feels her ear are full, hearing loss, ear pain bilaterally.   Depression History:      The patient denies a depressed mood most of the day and a diminished interest in her usual daily activities.         Preventive Screening-Counseling & Management  Alcohol-Tobacco     Smoking Status: current     Smoking Cessation Counseling: yes     Packs/Day: 1  Caffeine-Diet-Exercise     Does Patient Exercise: no  Current Medications (verified): 1)  Cyclobenzaprine Hcl 10 Mg Tabs (Cyclobenzaprine Hcl) .... Take 1 Tablet By Mouth Three Times A Day 2)  Maxzide-25 37.5-25 Mg Tabs  (Triamterene-Hctz) .... Take 1 Tablet By Mouth Once A Day 3)  Effexor Xr 150 Mg Cp24 (Venlafaxine Hcl) .... Take 1 Tablet By Mouth Once A Day 4)  Nortriptyline Hcl 50 Mg Caps (Nortriptyline Hcl) .... Take 1 Tablet By Mouth At Bedtime 5)  Locoid 0.1 % Crea (Hydrocortisone Butyrate) .... Apply To Face Two Times A Day 6)  Lipitor 40 Mg  Tabs (Atorvastatin Calcium) .... Take One Tablet Daily. 7)  Ultram Er 100 Mg  Tb24 (Tramadol Hcl) .... Take One Tablet 4 Times Daily. 8)  Omeprazole 20 Mg Cpdr (Omeprazole) .... Take 1 Tablet By Mouth Two Times A Day 9)  Flonase 50 Mcg/act Susp (Fluticasone Propionate) .... Two Sprays Per Nostril Daily  Allergies: 1)  ! Codeine 2)  ! Penicillin 3)  ! Morphine  Review of Systems  The patient denies fever, chest pain, syncope, dyspnea on exertion, peripheral edema, headaches, abdominal pain, melena, and hematochezia.    Physical Exam  General:  alert, well-developed, and well-nourished.   Head:  normocephalic and atraumatic.   Ears:  R canal inflamed and R TM erythema.  cerumen both ear. Nose:  no external erythema, mucosal erythema, and mucosal edema.   Mouth:  pharyngeal erythema.   Lungs:  normal respiratory effort, no intercostal retractions, no accessory muscle use, and normal breath sounds.   Heart:  normal  rate and regular rhythm.   Abdomen:  soft, non-tender, normal bowel sounds, and no distention.   Extremities:  no edema. Neurologic:  alert & oriented X3 and cranial nerves II-XII intact.     Impression & Recommendations:  Problem # 1:  ACUTE FRONTAL SINUSITIS (ICD-461.1) Patient with sign and symptoms of sinusitis. She also has a component of midle ear infection. I will treat with antibiotics. She refused today ear lavage.  Her updated medication list for this problem includes:    Flonase 50 Mcg/act Susp (Fluticasone propionate) .Marland Kitchen..Marland Kitchen Two sprays per nostril daily    Bactrim Ds 800-160 Mg Tabs (Sulfamethoxazole-trimethoprim) .Marland Kitchen... Take 1  tablet twice a day for 7 days.  Problem # 2:  HYPERTENSION (ICD-401.9) Blood pressure high today, prior reading at goal. I will continue with current regimen. will reevaluate next visit.  Her updated medication list for this problem includes:    Maxzide-25 37.5-25 Mg Tabs (Triamterene-hctz) .Marland Kitchen... Take 1 tablet by mouth once a day  BP today: 147/105 Prior BP: 122/70 (08/31/2008)  Labs Reviewed: K+: 3.9 (08/31/2008) Creat: : 0.77 (08/31/2008)   Chol: 136 (08/31/2008)   HDL: 24 (08/31/2008)   LDL: 77 (08/31/2008)   TG: 177 (08/31/2008)  Complete Medication List: 1)  Cyclobenzaprine Hcl 10 Mg Tabs (Cyclobenzaprine hcl) .... Take 1 tablet by mouth three times a day 2)  Maxzide-25 37.5-25 Mg Tabs (Triamterene-hctz) .... Take 1 tablet by mouth once a day 3)  Effexor Xr 150 Mg Cp24 (Venlafaxine hcl) .... Take 1 tablet by mouth once a day 4)  Nortriptyline Hcl 50 Mg Caps (Nortriptyline hcl) .... Take 1 tablet by mouth at bedtime 5)  Locoid 0.1 % Crea (Hydrocortisone butyrate) .... Apply to face two times a day 6)  Lipitor 40 Mg Tabs (Atorvastatin calcium) .... Take one tablet daily. 7)  Ultram Er 100 Mg Tb24 (Tramadol hcl) .... Take one tablet 4 times daily. 8)  Omeprazole 20 Mg Cpdr (Omeprazole) .... Take 1 tablet by mouth two times a day 9)  Flonase 50 Mcg/act Susp (Fluticasone propionate) .... Two sprays per nostril daily 10)  Bactrim Ds 800-160 Mg Tabs (Sulfamethoxazole-trimethoprim) .... Take 1 tablet twice a day for 7 days.  Patient Instructions: 1)  Please schedule a follow-up appointment in 2 months. 2)  Decrease your pain medication to twice a day.  Prescriptions: BACTRIM DS 800-160 MG TABS (SULFAMETHOXAZOLE-TRIMETHOPRIM) Take 1 tablet twice a day for 7 days.  #14 x 0   Entered and Authorized by:   Hartley Barefoot MD   Signed by:   Hartley Barefoot MD on 03/25/2009   Method used:   Electronically to        [facility]* (retail)       [address]  [place], [place]  [address]       Ph: [phone] or [phone]       Fax: [phone]   RxID:   [id number] FLONASE 50 MCG/ACT SUSP (FLUTICASONE PROPIONATE) Two sprays per nostril daily  #1 x 2   Entered and Authorized by:   Hartley Barefoot MD   Signed by:   Hartley Barefoot MD on 03/25/2009   Method used:   Electronically to        [facility] (retail)       [address] [place], [place]  [address]  Ph: [phone] or [phone]       Fax: [phone]   RxID:   [id number]

## 2010-05-24 NOTE — Letter (Signed)
Summary: [place] [facility]: [facility]  [place] [facility]: [facility]   Imported By: Florinda Marker 09/10/2008 16:06:42  _____________________________________________________________________  External Attachment:    Type:   Image     Comment:   External Document

## 2010-05-24 NOTE — Assessment & Plan Note (Signed)
Summary: KNEE/ SB.   Vital Signs:  Patient profile:   55 year old female Height:      65 inches (165.10 cm) Temp:     97.7 degrees F (36.50 degrees C) oral Pulse rate:   60 / minute BP sitting:   122 / 70  (right arm)  Vitals Entered By: Filomena Jungling NT II (Aug 31, 2008 9:33 AM) CC: BILATERAL KNEE PAIN  ER Is Patient Diabetic? No Pain Assessment Patient in pain? yes     Location: knees Intensity: 7 Type: aching Onset of pain  Sudden  Have you ever been in a relationship where you felt threatened, hurt or afraid?No   Does patient need assistance? Functional Status Self care Ambulation Wheelchair   Primary Care Provider:  Olene Craven  CC:  BILATERAL KNEE PAIN  ER.  History of Present Illness: Miranda Gaines is a pleasant 55 y/o woman with HTN, HLPD, chronic back pain leading to deconditioning (wheelchair bound) in the setting of lumbar herniation s/p laminectomy who presents to the opc after sustaining a fall.  On 5/2, she was sitting on the porch when she saw a black snake crawling towards her. She jumped up to run away from it and ended up falling forward. Since then, she has had pain in both her knees, low back and both ankles. She was evaluated in the ED and xrays of all mentioned joints showed no acute injury except for a moderate to large knee effusion but no fracture or dislocation.  Pt comes in today explaining that she fractured her R knee. She has significant pain and wants to know what she can do b/c the Percocet she was given in the ED is "too strong" and made her nauseated.  Allergies: 1)  ! Codeine 2)  ! Penicillin 3)  ! Morphine  Past History:  Past Medical History:    MAJOR DEPRESSION      - follows with Dr. Cardell Peach from [facility]    GERD    HYPERLIPIDEMIA       - treated (goal LDL <130)    HYPERTENSION       - treated and controlled    CHRONIC LUMBAR PAIN       - 2nd to lumbar disk herniation s/p laminectomy L5-S1 1993    DECONDITIONING,  wheelchair bound 2nd to chronic back pain    BILIARY SLUDGE       - on [place] in 2005, normal HIDA scan    SEBORRHEIC DERMATITIS of face       - sees Dr. Terri Piedra (derm)    HEARING LOSS from recurrent otitis       - with R hearing aid, followed by ENT    RECURRENT OTITIS EXTERNA    TOBACCO ABUSE    R KNEE SPRAIN, 08/23/2008       - with severe knee effusion (xray 08/24/2008 in ED)       - referred to Dr. Malon Kindle from ortho    Hx of recurrent yeast vaginitis and Trichomonas  Social History:    Divorced, no children    Disabled, wheelchair bound from chronic back pain 2/2 workplace injury in her 20's.  She used to be a Psychologist, occupational.    Lives alone in a house.    Current Smoker    Alcohol use-no    Drug use-no    Menopaused in 2001    Last sexual activity in 2001  Review of Systems General:  Denies chills, fever, and weakness.  Eyes:  Denies blurring and double vision. ENT:  Denies ear discharge and earache. CV:  Complains of swelling of feet; denies chest pain or discomfort and palpitations; c/o edema in her RLE since the injury.Marland Kitchen Resp:  Denies cough and shortness of breath. GI:  Denies bloody stools, dark tarry stools, and vomiting. GU:  Denies dysuria and hematuria. MS:  Complains of joint pain, joint swelling, and low back pain; denies joint redness. Derm:  Denies itching and lesion(s). Neuro:  Denies brief paralysis and headaches. Psych:  Denies anxiety and depression. Endo:  Denies excessive hunger and excessive thirst. Heme:  Denies abnormal bruising and bleeding. Allergy:  Denies seasonal allergies and sneezing.  Physical Exam  General:  Middle-aged woman in NAD, sitting in motorized wheelchair. She is wearing a full LE mobilizer and looks in pain. Head:  atraumatic.   Eyes:  vision grossly intact, pupils equal, pupils round, and pupils reactive to light. Anicteric, no injection. Ears:  Wearing a R hearing aid. Nose:  no external deformity.   Mouth:  Fair dentition, OP  clear. Neck:  supple, full ROM, and no masses.   Lungs:  normal respiratory effort, no intercostal retractions, no accessory muscle use, and normal breath sounds.   Heart:  normal rate and regular rhythm.   Abdomen:  soft, non-tender, normal bowel sounds, no distention, and no masses.   Msk:  R knee warm to touch with mild erythema. Moderate effusion. Limited ROM 2/2 pain and effusion. No deformities or instability. Pulses:  2+ pedal pulses bilaterally. Extremities:  Trace edema in RLE from knee to foot. Neurologic:  alert & oriented X3 and cranial nerves II-XII intact.   Skin:  no suspicious lesions, no petechiae, and no purpura.   Cervical Nodes:  no anterior cervical adenopathy and no posterior cervical adenopathy.   Psych:  Oriented X3, memory intact for recent and remote, normally interactive, good eye contact, not anxious appearing, and not depressed appearing.     Impression & Recommendations:  Problem # 1:  KNEE SPRAIN, RIGHT (ICD-844.9) With persistent effusion and uncontrolled pain. Already has an apptmt with Dr. Malon Kindle from ortho on Wednesday 5/12. Will give her a Toradol 30 mg IM injection now.  Orders: Home Health Referral [facility]) for assistance since she lives by herself. Wrote for diclofenac 75 mg by mouth two times a day for anti-inflammatory purposes. Wrote for hydrocodone/apap 5/325 mg 1/2 to 1 tab by mouth every 4-6 hrs as needed for pain. To be taken with 12.5 mg of promethazine beforehand to avoid nausea.  Problem # 2:  HYPERTENSION (ICD-401.9) Well controlled.   Her updated medication list for this problem includes:    Maxzide-25 37.5-25 Mg Tabs (Triamterene-hctz) .Marland Kitchen... Take 1 tablet by mouth once a day  Orders: T-Comprehensive Metabolic Panel [id number]) T-Lipid Profile ([id number])  BP today: 122/70 Prior BP: 119/87 (06/29/2008)  Labs Reviewed: K+: 3.4 (01/31/2008) Creat: : 0.94 (01/31/2008)   Chol: 143 (01/31/2008)   HDL: 32  (01/31/2008)   LDL: 85 (01/31/2008)   TG: 130 (01/31/2008)  Problem # 3:  HYPERLIPIDEMIA (ICD-272.4) Time for a FLP and CMET.  Her updated medication list for this problem includes:    Lipitor 40 Mg Tabs (Atorvastatin calcium) .Marland Kitchen... Take one tablet daily.  Labs Reviewed: SGOT: 14 (01/31/2008)   SGPT: 11 (01/31/2008)   HDL:32 (01/31/2008), 32 (04/30/2007)  LDL:85 (01/31/2008), 167 (56/38/7564)  Chol:143 (01/31/2008), 224 (04/30/2007)  Trig:130 (01/31/2008), 127 (04/30/2007)  Orders: T-Comprehensive Metabolic Panel [id number]) T-Lipid Profile ([id number])  Problem #  4:  TOBACCO ABUSE (ICD-305.1) Still smoking. Encouraged to reconsider cessation.  The following medications were removed from the medication list:    Nicotine 21 Mg/24hr Pt24 (Nicotine) .Marland Kitchen... Apply once daily for 6 weeks then follow by 14 mg/24h patch.    Nicotine 14 Mg/24hr Pt24 (Nicotine) .Marland Kitchen... Apply once daily for 6 weeks then follow by 7 mg/24h patch.    Nicotine 7 Mg/24hr Pt24 (Nicotine) .Marland Kitchen... Apply once daily for 6 weeks then stop.  Problem # 5:  GERD (ICD-530.81) No alarm symptoms. Advised her to take the diclofenac (NSAID) with food.  Her updated medication list for this problem includes:    Omeprazole 20 Mg Cpdr (Omeprazole) .Marland Kitchen... Take 1 tablet by mouth two times a day  Complete Medication List: 1)  Cyclobenzaprine Hcl 10 Mg Tabs (Cyclobenzaprine hcl) .... Take 1 tablet by mouth three times a day 2)  Maxzide-25 37.5-25 Mg Tabs (Triamterene-hctz) .... Take 1 tablet by mouth once a day 3)  Effexor Xr 150 Mg Cp24 (Venlafaxine hcl) .... Take 1 tablet by mouth once a day 4)  Nortriptyline Hcl 50 Mg Caps (Nortriptyline hcl) .... Take 1 tablet by mouth at bedtime 5)  Locoid 0.1 % Crea (Hydrocortisone butyrate) .... Apply to face two times a day 6)  Lipitor 40 Mg Tabs (Atorvastatin calcium) .... Take one tablet daily. 7)  Ultram Er 100 Mg Tb24 (Tramadol hcl) .... Take one tablet 4 times daily. 8)  Omeprazole 20 Mg  Cpdr (Omeprazole) .... Take 1 tablet by mouth two times a day 9)  Flonase 50 Mcg/act Susp (Fluticasone propionate) .... Two sprays per nostril daily 10)  Diclofenac Sodium 75 Mg Tbec (Diclofenac sodium) .... Take 1 tablet by mouth two times a day 11)  Hydrocodone-acetaminophen 5-325 Mg Tabs (Hydrocodone-acetaminophen) .... Take 1/2 to 1 tab by mouth every 4 hours as needed for pain. 12)  Promethazine Hcl 12.5 Mg Tabs (Promethazine hcl) .... Take one tab by mouth every 4-6 hours as needed for nausea.  Patient Instructions: 1)  Take diclofenac 75 mg by mouth two times a day to help with the inflammation in your knee. Take it with food. 2)  Take hydrocodone 1/2 to 1 tab by mouth every 4 hours as needed for pain. If it makes you nauseated, take promethazine (Phenergan) 12.5 mg by mouth every 4-6 hours before you take the pain medication. 3)  Make sure you keep your appointment with Dr. Ranell Patrick on Wednesday 5/12. 4)  Stay off of your leg. 5)  I will request for [facility] to come evaluate you. Prescriptions: PROMETHAZINE HCL 12.5 MG TABS (PROMETHAZINE HCL) Take one tab by mouth every 4-6 hours as needed for nausea.  #20 x 0   Entered and Authorized by:   Olene Craven MD   Signed by:   Olene Craven MD on 08/31/2008   Method used:   Electronically to        [facility]* (retail)       [address] Felicita Gage [address] [place], [place]  [address]       Ph: [phone] or [phone]       Fax: [phone]   RxID:   [id number] HYDROCODONE-ACETAMINOPHEN 5-325 MG TABS (HYDROCODONE-ACETAMINOPHEN) Take 1/2 to 1 tab by mouth every 4 hours as needed for pain.  #20 x 0   Entered and Authorized by:   Olene Craven MD   Signed by:   Olene Craven  MD on 08/31/2008   Method used:   Print then Give to Patient   RxID:   [id number] DICLOFENAC SODIUM 75 MG TBEC (DICLOFENAC SODIUM) Take 1 tablet by mouth two times a day  #30 x 1   Entered and Authorized by:    Olene Craven MD   Signed by:   Olene Craven MD on 08/31/2008   Method used:   Electronically to        [facility]* (retail)       [address] Felicita Gage [address] [place], [place]  [address]       Ph: [phone] or [phone]       Fax: [phone]   RxID:   [id number]

## 2010-05-24 NOTE — Miscellaneous (Signed)
Summary: [id number]  [facility]   Imported By: Florinda Marker 10/04/2006 11:55:58  _____________________________________________________________________  External Attachment:    Type:   Image     Comment:   External Document

## 2010-05-24 NOTE — Progress Notes (Signed)
Summary: Refill/gh  Phone Note Refill Request   Refills Requested: Medication #1:  LOCOID 0.1 % CREA Apply to face two times a day Last visit was 03/25/2009.  Last labs were 08/31/2008   Method Requested: Electronic Initial call taken by: Angelina Ok RN,  July 23, 2009 4:56 PM  Follow-up for Phone Call        Pt has been on this cream since 2009.  The dermatologists that I trained with cautioned against using steroid creams on face long term bc can cause skin atrophy.  I realize this is a low dose but has been on about 1.5 years.  Will leave decision up to primary MD.  Additional Follow-up for Phone Call Additional follow up Details #1::        denied.  this was prescribed by Dr. Terri Piedra, and is a medication used primarily be dermatolgists.  she should follow up with him regarding refills.

## 2010-05-24 NOTE — Progress Notes (Signed)
Summary: refill/ hla  Phone Note Refill Request Message from:  Fax from Pharmacy on March 26, 2007 8:01 AM  Refills Requested: Medication #1:  MAXZIDE-25 37.5-25 MG TABS Take 1 tablet by mouth once a day   Last Refilled: 10/29  Medication #2:  LIPITOR 20 MG TABS Take 1 tablet by mouth once a day   Last Refilled: 10/29 Initial call taken by: Marin Roberts RN,  March 26, 2007 8:01 AM  Follow-up for Phone Call        Refill approved-nurse to complete. Pt needs to come in for a CMET and a FLP. Follow-up by: Olene Craven MD,  March 27, 2007 3:08 PM      Prescriptions: LIPITOR 20 MG TABS (ATORVASTATIN CALCIUM) Take 1 tablet by mouth once a day  #31 x 1   Entered and Authorized by:   Olene Craven MD   Signed by:   Olene Craven MD on 03/27/2007   Method used:   Telephoned to ...       [facility]       [address] [place] [address] Felicita Gage [address]       [place], [place]  [address]       Ph: [phone] or [phone]       Fax: [phone]   RxID:   [id number] MAXZIDE-25 37.5-25 MG TABS (TRIAMTERENE-HCTZ) Take 1 tablet by mouth once a day  #31 x 1   Entered and Authorized by:   Olene Craven MD   Signed by:   Olene Craven MD on 03/27/2007   Method used:   Telephoned to ...       [facility]       [address] [place] [address] Felicita Gage [address]       [place], [place]  [address]       Ph: [phone] or [phone]       Fax: [phone]   RxID:   [id number]

## 2010-05-24 NOTE — Progress Notes (Signed)
Summary: med refill  Phone Note Call from Patient   Caller: Patient Details for Reason: med. refill Summary of Call: Pt. called about HBP and CHOL. med. refill.  Called pt. back and instructed pt. we have received Refill Request from [facility]  about 3:00pm today. Initial call taken by: [name] RN,  March 25, 2007 4:06 PM

## 2010-05-24 NOTE — Medication Information (Signed)
Summary: [facility]: RX  [facility]: RX   Imported By: Florinda Marker 04/15/2009 15:51:17  _____________________________________________________________________  External Attachment:    Type:   Image     Comment:   External Document

## 2010-05-24 NOTE — Miscellaneous (Signed)
Summary: HIPAA Restrictions  HIPAA Restrictions   Imported By: Florinda Marker 06/29/2008 16:59:40  _____________________________________________________________________  External Attachment:    Type:   Image     Comment:   External Document

## 2010-05-24 NOTE — Assessment & Plan Note (Signed)
Summary: knee pain    Medication Administration  Injection # 1:    Medication: Ketorolac-Toradol 15mg     Diagnosis: KNEE SPRAIN, RIGHT (ICD-844.9)    Route: IM    Site: L deltoid    Exp Date: 04/24/2009    Lot #: [id number]    Mfr: Hospira    Comments: Pt given 30 mg    Patient tolerated injection without complications    Given by: Angelina Ok RN (Aug 31, 2008 10:23 AM)  Orders Added: 1)  Ketorolac-Toradol 15mg  [J1885] 2)  Admin of Therapeutic Inj  intramuscular or subcutaneous [[id number]]   Appended Document: Orders Update    Clinical Lists Changes  Orders: Added new Referral order of Home Health Referral [facility]) - Signed

## 2010-05-24 NOTE — Assessment & Plan Note (Signed)
Summary: BP CHECK (CHARRON) SB.   Vital Signs:  Patient Profile:   55 Years Old Female Height:     65 inches (165.10 cm) Weight:      159.0 pounds (72.27 kg) BMI:     26.55 Temp:     98.4 degrees F (36.89 degrees C) oral Pulse rate:   69 / minute BP sitting:   128 / 78  (right arm) Cuff size:   regular  Pt. in pain?   yes    Location:   head    Intensity:     10    Type:       ALL  Vitals Entered By: Theotis Barrio NT II (March 11, 2008 1:43 PM)              Is Patient Diabetic? No Nutritional Status BMI of 25 - 29 = overweight  Have you ever been in a relationship where you felt threatened, hurt or afraid?No   Does patient need assistance? Functional Status Self care Ambulation Normal     PCP:  Olene Craven  Chief Complaint:  ? SINUS INFECTION  /  SINUS HEADACHE.  History of Present Illness: Ms. Dreese is a 55 y/o woman with deconditioning 2/2 chronic back pain, major depression, HTN, HLPD presenting much sooner than indicated at her last visit. She was given a referral to the orthopaedic clinic by Dr. Reynold Bowen following her last visit, and an interval plain film of her neck was noted with degenerative disc disease, prompting the referral. Her blood pressure was under good control, and her LDL of 85 was at target. She had a potassium of 3.4. She presents in her electronic wheelchair. She tells the nurse tech that she may have sinus congestion. SHe states that since October, her entire head has been stopped up. She feels her head is swollen, her ears are popping, her eyes are sore, and it is difficult to breath in through her nose. She took some sudafed without relief. She has never had this before. She feels her left ear is stopped up, and her hearing is affected. She feels like there is water in her head. She tells me that she has hearing loss in her left ear, because they were damaged from multiple ear infections as a child, she tells me. Her last ear infection was  too long ago for her to remember.  She had her ears irrigated in October, and they were stopped up the next day. Suicide risk questions reveal that she does not feel like life is worth living.  The patient denies that she wishes that she were dead and denies that she has thought about ending her life.           Current Allergies: ! CODEINE ! PENICILLIN ! MORPHINE    Risk Factors:  Tobacco use:  current    Cigarettes:  Yes -- 1 pack(s) per day Drug use:  no Alcohol use:  no Exercise:  no Seatbelt use:  100 %  Family History Risk Factors:    Family History of MI in females < 56 years old:  yes  Colonoscopy History:    Date of Last Colonoscopy:  03/22/2006  Mammogram History:    Date of Last Mammogram:  04/23/2007  PAP Smear History:    Date of Last PAP Smear:  06/20/2006   Review of Systems      See HPI   Physical Exam  General:     alert, well-developed, well-nourished, and well-hydrated.  Middle-aged woman in motorized wheelchair. Ears:     hearing aide in her right ear, but normal ears bilaterally. She does not hear well. Nose:     swollen bilateral boggy nasal turbinates with surrounding mucus. Lungs:     normal respiratory effort, no intercostal retractions, no accessory muscle use, normal breath sounds, no crackles, and no wheezes.   Neurologic:     alert & oriented X3 and cranial nerves II-XII intact.   Psych:     She is a bit irritable, but otherwise appropriate.    Impression & Recommendations:  Problem # 1:  DEGENERATIVE DISC DISEASE, CERVICAL SPINE (ICD-722.4) We will refer her to the orthopaedist concerning her chronic back issues, and she will continue at the pain clinic.  Problem # 2:  HYPERLIPIDEMIA (ICD-272.4) Greatly improved, will make no change Her updated medication list for this problem includes:    Lipitor 40 Mg Tabs (Atorvastatin calcium) .Marland Kitchen... Take one tablet daily.  Labs Reviewed: Chol: 143 (01/31/2008)   HDL: 32 (01/31/2008)    LDL: 85 (01/31/2008)   TG: 130 (01/31/2008) SGOT: 14 (01/31/2008)   SGPT: 11 (01/31/2008)   Problem # 3:  HYPERTENSION (ICD-401.9) Stable, will make no changes, though I would keep an eye on the potassium. However, I will not provide supplementation for the potassium of 3.4 at this time. Her updated medication list for this problem includes:    Maxzide-25 37.5-25 Mg Tabs (Triamterene-hctz) .Marland Kitchen... Take 1 tablet by mouth once a day  BP today: 128/78 Prior BP: 124/83 (01/31/2008)  Labs Reviewed: Creat: 0.94 (01/31/2008) Chol: 143 (01/31/2008)   HDL: 32 (01/31/2008)   LDL: 85 (01/31/2008)   TG: 130 (01/31/2008)   Problem # 4:  ACUTE FRONTAL SINUSITIS (ICD-461.1) Will treat with a short course of flonase, and doxycycline as her symptoms have been present for about a month. She is advised to contact the clinic if this is ineffective, and at that point, claritin D and an additional nasal spray can be tried, but this can be over the phone. Her updated medication list for this problem includes:    Flonase 50 Mcg/act Susp (Fluticasone propionate) .Marland Kitchen... Take 2 sorays in each nostril once daily for 4 days, and then one spray in each nostril once daily for 10 days    Doxycycline Hyclate 100 Mg Tabs (Doxycycline hyclate) .Marland Kitchen... Take 1 tablet by mouth two times a day   Problem # 5:  TOBACCO ABUSE (ICD-305.1) Will not purchase the nicotine patches because they are not covered by medicaid. I am hesitant to start chantix or zyban because of her depression and her being on effexor, though if the side effect profile is reviewed, they may be safe. I informed her that the patch over time will end up saving her money, but she did not appear interested. My hunch unfortunatly is she has no true desire to quit smoking at this time. Her updated medication list for this problem includes:    Nicotine 21 Mg/24hr Pt24 (Nicotine) .Marland Kitchen... Apply once daily for 6 weeks then follow by 14 mg/24h patch.    Nicotine 14 Mg/24hr  Pt24 (Nicotine) .Marland Kitchen... Apply once daily for 6 weeks then follow by 7 mg/24h patch.    Nicotine 7 Mg/24hr Pt24 (Nicotine) .Marland Kitchen... Apply once daily for 6 weeks then stop.   Complete Medication List: 1)  Cyclobenzaprine Hcl 10 Mg Tabs (Cyclobenzaprine hcl) .... Take 1 tablet by mouth three times a day 2)  Maxzide-25 37.5-25 Mg Tabs (Triamterene-hctz) .... Take 1 tablet by  mouth once a day 3)  Effexor Xr 150 Mg Cp24 (Venlafaxine hcl) .... Take 1 tablet by mouth once a day 4)  Nortriptyline Hcl 50 Mg Caps (Nortriptyline hcl) .... Take 1 tablet by mouth at bedtime 5)  Elidel 1 % Crea (Pimecrolimus) .... Apply to face two times a day 6)  Locoid 0.1 % Crea (Hydrocortisone butyrate) .... Apply to face two times a day 7)  Lipitor 40 Mg Tabs (Atorvastatin calcium) .... Take one tablet daily. 8)  Miralax Powd (Polyethylene glycol 3350) .... Take 1 heaping teaspoon in one glass of liquid once daily 9)  Ultram Er 100 Mg Tb24 (Tramadol hcl) .... Take one tablet 4 times daily. 10)  Omeprazole 20 Mg Cpdr (Omeprazole) .... Take 1 tablet by mouth two times a day 11)  Nicotine 21 Mg/24hr Pt24 (Nicotine) .... Apply once daily for 6 weeks then follow by 14 mg/24h patch. 12)  Nicotine 14 Mg/24hr Pt24 (Nicotine) .... Apply once daily for 6 weeks then follow by 7 mg/24h patch. 13)  Nicotine 7 Mg/24hr Pt24 (Nicotine) .... Apply once daily for 6 weeks then stop. 14)  Flonase 50 Mcg/act Susp (Fluticasone propionate) .... Take 2 sorays in each nostril once daily for 4 days, and then one spray in each nostril once daily for 10 days 15)  Doxycycline Hyclate 100 Mg Tabs (Doxycycline hyclate) .... Take 1 tablet by mouth two times a day   Patient Instructions: 1)  Please return to the clinic in 6-12 months for a routine follow up. If the flonase does not work, please contact us in one week, and we will try an alternative. 2)  I am also prescribing a short course of an antibiotic that may help clear up your sinuses a bit  sooner.   Prescriptions: DOXYCYCLINE HYCLATE 100 MG TABS (DOXYCYCLINE HYCLATE) Take 1 tablet by mouth two times a day  #14 x 0   Entered and Authorized by:   Valetta Close MD   Signed by:   Valetta Close MD on 03/11/2008   Method used:   Electronically to        [facility]* (retail)       [address] Felicita Gage [address] [place], [place]  [address]       Ph: [phone] or [phone]       Fax: [phone]   RxID:   [id number] FLONASE 50 MCG/ACT SUSP (FLUTICASONE PROPIONATE) take 2 sorays in each nostril once daily for 4 days, and then one spray in each nostril once daily for 10 days  #2 wk. supply x 2   Entered and Authorized by:   Valetta Close MD   Signed by:   Valetta Close MD on 03/11/2008   Method used:   Electronically to        [facility]* (retail)       [address] Felicita Gage [address] [place], [place]  [address]       Ph: [phone] or [phone]       Fax: [phone]   RxID:   [id number]  ]

## 2010-05-24 NOTE — Miscellaneous (Signed)
Summary: PCS  This case was referred by [facility]. Paperwork completed for [facility] assessment as patient uses wheelchair/ multiple medical problems and gait instability.  Called [place] to let her know nurse would be calling from state to help her in obtaining PCS aid.   Will obtain signature from Dr. Reynold Bowen.   Appended Document: PCS Order signed by Dr. Reynold Bowen and faxed to [facility] today.

## 2010-05-24 NOTE — Assessment & Plan Note (Signed)
Summary: FLU/ SB.  Nurse Visit    Prior Medications: CYCLOBENZAPRINE HCL 10 MG TABS (CYCLOBENZAPRINE HCL) Take 1 tablet by mouth three times a day NEURONTIN 600 MG TABS (GABAPENTIN) Take 1 tablet by mouth three times a day MAXZIDE-25 37.5-25 MG TABS (TRIAMTERENE-HCTZ) Take 1 tablet by mouth once a day EFFEXOR XR 150 MG CP24 (VENLAFAXINE HCL) Take 1 tablet by mouth once a day NORTRIPTYLINE HCL 50 MG CAPS (NORTRIPTYLINE HCL) Take 1 tablet by mouth at bedtime ELIDEL 1 % CREA (PIMECROLIMUS) Apply to face two times a day LOCOID 0.1 % CREA (HYDROCORTISONE BUTYRATE) Apply to face two times a day LIPITOR 20 MG TABS (ATORVASTATIN CALCIUM) Take 1 tablet by mouth once a day MIRALAX  POWD (POLYETHYLENE GLYCOL 3350) Take 1 heaping teaspoon in one glass of liquid once daily LACTULOSE 10 GM/15ML SOLN (LACTULOSE) Take 15-48ml each day, titrate for 1 bowel movement each day GUAI 800MG /DM 30MG  800-30 MG  TB12 (DEXTROMETHORPHAN-GUAIFENESIN) Take about 10mg  by mouth every 6 hours as needed Current Allergies: ! CODEINE ! PENICILLIN ! MORPHINE   Influenza Vaccine    Vaccine Type: Fluvax MCR    Site: left deltoid    Dose: 0.5 ml    Route: IM    Given by: Angelina Ok RN    Exp. Date: 10/22/2007    Lot #: [id number]    VIS given: 10/21/04 version given January 30, 2007.  Flu Vaccine Consent Questions    Do you have a history of severe allergic reactions to this vaccine? no    Any prior history of allergic reactions to egg and/or gelatin? no    Do you have a sensitivity to the preservative Thimersol? no    Do you have a past history of Guillan-Barre Syndrome? no    Do you currently have an acute febrile illness? no    Have you ever had a severe reaction to latex? no    Vaccine information given and explained to patient? no    Are you currently pregnant? no     ]  Appended Document: FLU/ SB.   Influenza Vaccine    Vaccine Type: Fluvax MCR    Site: left deltoid    Dose: 0.5 ml    Route: IM   Given by: Angelina Ok RN    Exp. Date: 10/22/2007    Lot #: [id number]    VIS given: 10/21/04 version given January 30, 2007.  Flu Vaccine Consent Questions    Do you have a history of severe allergic reactions to this vaccine? no    Any prior history of allergic reactions to egg and/or gelatin? no    Do you have a sensitivity to the preservative Thimersol? no    Do you have a past history of Guillan-Barre Syndrome? no    Do you currently have an acute febrile illness? no    Have you ever had a severe reaction to latex? no    Vaccine information given and explained to patient? no    Are you currently pregnant? no

## 2010-05-24 NOTE — Progress Notes (Signed)
Summary: refill/gg  Phone Note Refill Request  on March 30, 2009 12:06 PM  Refills Requested: Medication #1:  LOCOID 0.1 % CREA Apply to face two times a day  Medication #2:  LIPITOR 40 MG  TABS Take one tablet daily.  Medication #3:  ULTRAM ER 100 MG  TB24 Take one tablet 4 times daily.  Medication #4:  OMEPRAZOLE 20 MG CPDR Take 1 tablet by mouth two times a day  Method Requested: Fax to Local Pharmacy Initial call taken by: Merrie Roof RN,  March 30, 2009 12:06 PM    Prescriptions: OMEPRAZOLE 20 MG CPDR (OMEPRAZOLE) Take 1 tablet by mouth two times a day  #60 x 11   Entered and Authorized by:   Elby Showers MD   Signed by:   Elby Showers MD on 03/30/2009   Method used:   Electronically to        [facility]* (retail)       [address] Felicita Gage [address] [place], [place]  [address]       Ph: [phone] or [phone]       Fax: [phone]   RxID:   [id number] LIPITOR 40 MG  TABS (ATORVASTATIN CALCIUM) Take one tablet daily.  #32 x 11   Entered and Authorized by:   Elby Showers MD   Signed by:   Elby Showers MD on 03/30/2009   Method used:   Electronically to        [facility]* (retail)       [address] Felicita Gage [address] [place], [place]  [address]       Ph: [phone] or [phone]       Fax: [phone]   RxID:   [id number]   Appended Document: refill/gg    Clinical Lists Changes  Medications: Rx of LOCOID 0.1 % CREA (HYDROCORTISONE BUTYRATE) Apply to face two times a day;  #45g x 1;  Signed;  Entered by: Elby Showers MD;  Authorized by: Elby Showers MD;  Method used: Electronically to [facility]*, [address], [place], [place], [place]  [address], Ph: [phone] or [phone], Fax: [phone]    Prescriptions: LOCOID 0.1 % CREA (HYDROCORTISONE BUTYRATE) Apply to face two times a day  #45g x 1   Entered and Authorized by:   Elby Showers MD  Signed by:   Elby Showers MD on 03/30/2009   Method used:   Electronically to        [facility]* (retail)       [address] Felicita Gage [address] [place], [place]  [address]       Ph: [phone] or [phone]       Fax: [phone]   RxID:   [id number]  refill for ultram denied as it is prescribed by another physcian who is working with him on pain issues.

## 2010-05-24 NOTE — Progress Notes (Signed)
Summary: med refill/gp  Phone Note Refill Request Message from:  Fax from Pharmacy on November 16, 2009 4:30 PM  Refills Requested: Medication #1:  MIRALAX  POWD Take 17g daily in liquid for constipation.. Last appt. June 7.   Method Requested: Telephone to Pharmacy Initial call taken by: [name] RN,  November 16, 2009 4:30 PM  Follow-up for Phone Call        Refill approved-nurse to complete Follow-up by: Julaine Fusi  DO,  November 16, 2009 4:45 PM    Prescriptions: MIRALAX  POWD (POLYETHYLENE GLYCOL 3350) Take 17g daily in liquid for constipation.  #1 mo supply x 6   Entered by:   Julaine Fusi  DO   Authorized by:   Marland Kitchen O[facility] ATTENDING DESKTOP   Signed by:   Julaine Fusi  DO on 11/16/2009   Method used:   Faxed to ...       [facility] (mail-order)       [address]       [place], [place]  [address]       Ph: [phone]       Fax: [phone]   RxID:   [id number]

## 2010-05-24 NOTE — Assessment & Plan Note (Signed)
Summary: Social Work/Smoking Cessation   Social Work Evaluation Date  05/21/2007 Patient name Miranda Gaines  Primary MD   : Olene Craven Social Worker's name : Dorothe Pea MSW- LCSW  Home [phone]    Cell phone: .  Marland Kitchen     Alternate phone: . Marland Kitchen        Primary Reason for Referral:      Smoking Cessation Counseling Comments Pt smokes about 1/2 pack of cigarettes per day.   She says that she is quite addicted and when she runs out will search for cigarette butts in the garbage pail.   She was in the hospital recently and had some success with the patches. But she can't afford to purchase Nicoderm at the store.    Wheelchair bound. Isolated social situation.  Things should improve once she gets new aide from [employer]  May be able to get out more.   Likes word search books.  Likes to chew gum and suckers.     Action taken by Social Work:  [name] cannot afford patches right now.  So the plan is for her to cut back on the # of cigarettes and begin saving to purchase Steps One and Two of the [facility] brand patches.   She is to call me when she chooses a quit date so that I can follow up with her.  Offered variety of tips for her to succeed.  First of all, she needs to make sure that her aide is a non-smoker and make that request.   She knows she needs to make her apt smokefree by getting rid of ashtrays and cleaning apt of the smell.   She's receptive to using gum and suckers to keep her mouth occupied.  She likes puzzle books and so she plans to purchase some of those so she can increase her pleasures.   SW to follow with patient and encourage quit date.

## 2010-05-24 NOTE — Assessment & Plan Note (Signed)
Summary: FLU/SB.  Nurse Visit   Allergies: 1)  ! Codeine 2)  ! Penicillin 3)  ! Morphine 4)  ! Oxycontin (Oxycodone Hcl)  Immunizations Administered:  Influenza Vaccine # 1:    Vaccine Type: Fluvax MCR    Site: right deltoid    Mfr: GlaxoSmithKline    Dose: 0.5 ml    Route: IM    Given by: [name] RN    Exp. Date: 10/22/2010    Lot #: [id number]    VIS given: 11/16/09 version given January 31, 2010.  Flu Vaccine Consent Questions:    Do you have a history of severe allergic reactions to this vaccine? no    Any prior history of allergic reactions to egg and/or gelatin? no    Do you have a sensitivity to the preservative Thimersol? no    Do you have a past history of Guillan-Barre Syndrome? no    Do you currently have an acute febrile illness? no    Have you ever had a severe reaction to latex? no    Vaccine information given and explained to patient? yes    Are you currently pregnant? no  Orders Added: 1)  Influenza Vaccine MCR [00025]

## 2010-05-24 NOTE — Progress Notes (Signed)
Summary: refill/ hla  Phone Note Refill Request Message from:  Fax from Pharmacy on May 26, 2009 3:55 PM  Refills Requested: Medication #1:  FLONASE 50 MCG/ACT SUSP Two sprays per nostril daily  Medication #2:  LOCOID 0.1 % CREA Apply to face two times a day Initial call taken by: Marin Roberts RN,  May 26, 2009 3:55 PM  Follow-up for Phone Call        signed. please call in. thanks.    Prescriptions: FLONASE 50 MCG/ACT SUSP (FLUTICASONE PROPIONATE) Two sprays per nostril daily  #1 x 2   Entered and Authorized by:   Elby Showers MD   Signed by:   Elby Showers MD on 05/27/2009   Method used:   Telephoned to ...       [facility] (retail)       [address]       [place], [place]  [address]       Ph: [phone]       Fax: [phone]   RxID:   [id number] LOCOID 0.1 % CREA (HYDROCORTISONE BUTYRATE) Apply to face two times a day  #45g x 1   Entered and Authorized by:   Elby Showers MD   Signed by:   Elby Showers MD on 05/27/2009   Method used:   Telephoned to ...       [facility] (retail)       [address]       [place], [place]  [address]       Ph: [phone]       Fax: [phone]   RxID:   [id number] FLONASE 50 MCG/ACT SUSP (FLUTICASONE PROPIONATE) Two sprays per nostril daily  #1 x 2   Entered and Authorized by:   Elby Showers MD   Signed by:   Elby Showers MD on 05/27/2009   Method used:   Print then Give to Patient   RxID:   [id number] LOCOID 0.1 % CREA (HYDROCORTISONE BUTYRATE) Apply to face two times a day  #45g x 1   Entered and Authorized by:   Elby Showers MD   Signed by:   Elby Showers MD on 05/27/2009   Method used:   Print then Give to Patient   RxID:   [id number]

## 2010-05-24 NOTE — Letter (Signed)
Summary: [facility]  [facility]   Imported By: Florinda Marker 11/04/2008 14:54:53  _____________________________________________________________________  External Attachment:    Type:   Image     Comment:   External Document

## 2010-05-24 NOTE — Progress Notes (Signed)
Summary: Refill/ gh  Phone Note Refill Request Message from:  Fax from Pharmacy on January 10, 2010 4:57 PM  Refills Requested: Medication #1:  FLONASE 50 MCG/ACT SUSP Two sprays per nostril daily Last labs were 09/06/2009.  Last visit was 09/28/2009.   Method Requested: Fax to Local Pharmacy Initial call taken by: Angelina Ok RN,  January 10, 2010 4:58 PM  Follow-up for Phone Call        Refill approved-nurse to complete Follow-up by: Julaine Fusi  DO,  January 10, 2010 5:11 PM    Prescriptions: FLONASE 50 MCG/ACT SUSP (FLUTICASONE PROPIONATE) Two sprays per nostril daily  #1 x 6   Entered and Authorized by:   Julaine Fusi  DO   Signed by:   Julaine Fusi  DO on 01/10/2010   Method used:   Re-Faxed to ...       [facility] (retail)       [address]       [place], [place]  [address]       Ph: [phone]       Fax: [phone]   RxID:   [id number]

## 2010-05-24 NOTE — Progress Notes (Signed)
Summary: refill/gg  Phone Note Refill Request  on March 30, 2009 12:06 PM  Refills Requested: Medication #1:  FLONASE 50 MCG/ACT SUSP Two sprays per nostril daily Please fax Rx, if not print out and I will fax   Method Requested: Fax to Local Pharmacy Initial call taken by: Merrie Roof RN,  March 30, 2009 12:07 PM    Prescriptions: Aleda Grana 50 MCG/ACT SUSP (FLUTICASONE PROPIONATE) Two sprays per nostril daily  #1 x 2   Entered and Authorized by:   Elby Showers MD   Signed by:   Elby Showers MD on 03/30/2009   Method used:   Electronically to        [facility]* (retail)       [address] Felicita Gage [address] [place], [place]  [address]       Ph: [phone] or [phone]       Fax: [phone]   RxID:   [id number]

## 2010-05-24 NOTE — Miscellaneous (Signed)
Summary: Social Work Referral   Social Work Evaluation Date  08/29/2006 Patient name Miranda Gaines  Primary MD   : Olene Craven Social Worker's name : Dorothe Pea MSW- LCSW  Home [phone]    Cell phone: .  Marland Kitchen     Alternate phone: . Marland Kitchen       Individual making referral: [facility]  Primary Reason for Referral:     Home Health/Personal Care Services Comments Request has come in from [facility] for [facility] evaluation.   Patient has chronic back pain, lumbar disc herniation, major depression, deconditioning, hypertension, dyslipidemia.  Patient in wheelchair.  [facility]   [phone]  Contact is Kennyth Arnold or [place].  Action taken by Social Work: Review of diagnoses and EMR.  Obvious self-care issues if she is wheelchair bound. Diagnoses support need for PCS.  Request MD signature.

## 2010-05-24 NOTE — Assessment & Plan Note (Signed)
Summary: ACUTE-HOARSENESS/HA'S/WANTS EARS FLUSHED/([place]ARRON)/CFB   Vital Signs:  Patient Profile:   55 Years Old Female Weight:      161.7 pounds Temp:     100.2 degrees F oral Pulse rate:   96 / minute BP sitting:   155 / 94  (right arm)  Vitals Entered By: Filomena Jungling (October 17, 2006 1:46 PM)             Is Patient Diabetic? No  Does patient need assistance? Functional Status Self care Ambulation Impaired:Risk for fall   PCP:  Olene Craven  Chief Complaint:  Cold & URI symptoms.  History of Present Illness: Miranda Gaines is a 55 y/o AA woman with a h/o recurrent external otitis presenting to the clinic today with c/o hoarseness of voice , productive cough , fevers, tenderness of face especially around the nose and "blocking sensation" of her ears Rt>Lt. Pt apparently has tried OTC lozenges and benedryl to no relief. She has not noted any ear discharge or nasal discharge. Also no CP, SOB.  Current Allergies: ! CODEINE ! PENICILLIN ! MORPHINE    Risk Factors: Tobacco use:  current    Cigarettes:  Yes -- 1/2 pack(s) per day Drug use:  no Alcohol use:  no  Family History Risk Factors:    Family History of MI in females < 60 years old:  yes  Colonoscopy History:    Date of Last Colonoscopy:  03/22/2006  Mammogram History:    Date of Last Mammogram:  04/12/2006  PAP Smear History:    Date of Last PAP Smear:  06/20/2006   Review of Systems      See HPI   Physical Exam  General:     alert, well-developed, and well-nourished.   Head:     atraumatic.   Eyes:     pupils equal, pupils round, and pupils reactive to light.   Ears:     R cerumen impaction, R decreased hearing, and L canal inflamed.   Nose:     mucosal erythema, bilateral maxillary and frontal sinus tenderness Mouth:     pharynx pink and moist with slight erythema  Neck:     supple.   Lungs:     normal respiratory effort, normal breath sounds, no crackles, and no wheezes.   Heart:  normal rate, regular rhythm, no murmur, no gallop, and no rub.      Impression & Recommendations:  Problem # 1:  UPPER RESPIRATORY INFECTION, ACUTE (ICD-465.9) Typically antibiotics are not prescribed in the first 7-10days of presentation as this is most likely a viral infection but I would like to start her on antibiotics for superadded bacterial infection. She was given a prescription for guaifenesin for cough suppression and advised OTC antihistamines. Also cerumen was disimpacted in both ears to see if there was an underlying ear infection with her h/o recurrent otitis externa and none was seen today.  Her updated medication list for this problem includes:    Guai 800mg /dm 30mg  800-30 Mg Tb12 (Dextromethorphan-guaifenesin) .Marland Kitchen... Take about 10mg  by mouth every 6 hours as needed  Orders: Cerumen Impaction Removal ([address])   Medications Added to Medication List This Visit: 1)  Bactrim 400-80 Mg Tabs (Sulfamethoxazole-trimethoprim) .... Take 1 tablet by mouth two times a day 2)  Guai 800mg /dm 30mg  800-30 Mg Tb12 (Dextromethorphan-guaifenesin) .... Take about 10mg  by mouth every 6 hours as needed  Complete Medication List: 1)  Cyclobenzaprine Hcl 10 Mg Tabs (Cyclobenzaprine hcl) .... Take 1 tablet by mouth three  times a day 2)  Neurontin 600 Mg Tabs (Gabapentin) .... Take 1 tablet by mouth three times a day 3)  Maxzide-25 37.5-25 Mg Tabs (Triamterene-hctz) .... Take 1 tablet by mouth once a day 4)  Effexor Xr 150 Mg Cp24 (Venlafaxine hcl) .... Take 1 tablet by mouth once a day 5)  Nortriptyline Hcl 50 Mg Caps (Nortriptyline hcl) .... Take 1 tablet by mouth at bedtime 6)  Elidel 1 % Crea (Pimecrolimus) .... Apply to face two times a day 7)  Locoid 0.1 % Crea (Hydrocortisone butyrate) .... Apply to face two times a day 8)  Lipitor 20 Mg Tabs (Atorvastatin calcium) .... Take 1 tablet by mouth once a day 9)  Miralax Powd (Polyethylene glycol 3350) .... Take 1 heaping teaspoon in one glass of  liquid once daily 10)  Lactulose 10 Gm/38ml Soln (Lactulose) .... Take 15-33ml each day, titrate for 1 bowel movement each day 11)  Bactrim 400-80 Mg Tabs (Sulfamethoxazole-trimethoprim) .... Take 1 tablet by mouth two times a day 12)  Guai 800mg /dm 30mg  800-30 Mg Tb12 (Dextromethorphan-guaifenesin) .... Take about 10mg  by mouth every 6 hours as needed   Patient Instructions: 1)  Please schedule a follow-up appointment in 2 weeks with Dr. Reynold Bowen. You may cancel your appointment if your symptoms have resolved. 2)  Get plenty of rest, drink lots of clear liquids, and use tylenol for fever and discomfort. return in 7-10 days if you're not better,sooner if you're feeling worse. 3)  Take your antibiotic as prescribed until ALL of it is gone, but stop if you develop a rash or swelling and contact our office as soon as possible. 4)  You may use Chlor-Trimeton 4 mg one pill every four hours to help with congestion.(Available over the counter) 5)  You may continue to use lozenges avaialble over the counter for sore throat.    Prescriptions: GUAI 800MG /DM 30MG  800-30 MG  TB12 (DEXTROMETHORPHAN-GUAIFENESIN) Take about 10mg  by mouth every 6 hours as needed  #1 x 2   Entered and Authorized by:   Yetta Barre MD   Signed by:   Yetta Barre MD on 10/17/2006   Method used:   Telephoned to ...         RxID:   [id number] BACTRIM 400-80 MG  TABS (SULFAMETHOXAZOLE-TRIMETHOPRIM) Take 1 tablet by mouth two times a day  #20 x 0   Entered and Authorized by:   Yetta Barre MD   Signed by:   Yetta Barre MD on 10/17/2006   Method used:   Telephoned to ...         RxID:   [id number]

## 2010-05-26 NOTE — Miscellaneous (Signed)
Summary: [facility]   Imported By: Shon Hough 05/06/2010 10:10:03  _____________________________________________________________________  External Attachment:    Type:   Image     Comment:   External Document

## 2010-06-01 ENCOUNTER — Ambulatory Visit (HOSPITAL_COMMUNITY): Admission: RE | Admit: 2010-06-01 | Payer: Self-pay | Source: Home / Self Care | Admitting: Internal Medicine

## 2010-06-01 ENCOUNTER — Ambulatory Visit (HOSPITAL_COMMUNITY): Payer: Medicaid Other

## 2010-06-01 NOTE — Assessment & Plan Note (Signed)
Summary: ACUTE-CHEST COLD/CONGESTION/CFB/ISAMAH   Vital Signs:  Patient profile:   55 year old female Height:      65 inches Weight:      162.3 pounds BMI:     27.11 Temp:     97.8 degrees F oral Pulse rate:   96 / minute BP sitting:   123 / 83  (right arm)  Vitals Entered By: Filomena Jungling NT II (May 23, 2010 9:03 AM) CC: 2 weeks coughing up  yellow chair, ? would like labs drawn , , Depression Is Patient Diabetic? No Pain Assessment Patient in pain? no      Nutritional Status BMI of 25 - 29 = overweight  Have you ever been in a relationship where you felt threatened, hurt or afraid?No   Does patient need assistance? Functional Status Self care Ambulation Normal   Primary Care Provider:  Jaci Lazier MD  CC:  2 weeks coughing up  yellow chair, ? would like labs drawn , , and Depression.  History of Present Illness: 55 y/o w with HTN, HLD and chronic back pain comes to the clinic c/o cough - 2 weeks - cough, productive greenish sputum, assoicated with runny nose and chest discomfort - no fever, chills, SOB,  - tried OTC meds, did not help - no sore throat, sinus pain,  - continues to smoke 1/2 ppd -   Depression History:      The patient denies a depressed mood most of the day and a diminished interest in her usual daily activities.         Preventive Screening-Counseling & Management  Alcohol-Tobacco     Smoking Status: current     Smoking Cessation Counseling: yes     Packs/Day: 1  Caffeine-Diet-Exercise     Does Patient Exercise: no  Current Medications (verified): 1)  Cyclobenzaprine Hcl 10 Mg Tabs (Cyclobenzaprine Hcl) .... Take 1 Tablet By Mouth Three Times A Day 2)  Maxzide-25 37.5-25 Mg Tabs (Triamterene-Hctz) .... Take 1 Tablet By Mouth Once A Day 3)  Effexor Xr 150 Mg Cp24 (Venlafaxine Hcl) .... Take 1 Tablet By Mouth Once A Day 4)  Nortriptyline Hcl 50 Mg Caps (Nortriptyline Hcl) .... Take 1 Tablet By Mouth At Bedtime 5)  Locoid 0.1 % Crea  (Hydrocortisone Butyrate) .... Apply To Face Two Times A Day 6)  Lipitor 40 Mg  Tabs (Atorvastatin Calcium) .... Take One Tablet Daily. 7)  Ultram Er 100 Mg  Tb24 (Tramadol Hcl) .... Take One Tablet 4 Times Daily. 8)  Omeprazole 20 Mg Cpdr (Omeprazole) .... Take 1 Tablet By Mouth Two Times A Day 9)  Flonase 50 Mcg/act Susp (Fluticasone Propionate) .... Two Sprays Per Nostril Daily 10)  Miralax  Powd (Polyethylene Glycol 3350) .... Take 17g Daily in Liquid For Constipation.  Allergies (verified): 1)  ! Codeine 2)  ! Penicillin 3)  ! Morphine 4)  ! Oxycontin (Oxycodone Hcl)  Review of Systems       The patient complains of hoarseness and prolonged cough.  The patient denies anorexia, fever, weight loss, weight gain, vision loss, decreased hearing, chest pain, syncope, dyspnea on exertion, peripheral edema, headaches, hemoptysis, abdominal pain, melena, hematochezia, severe indigestion/heartburn, hematuria, incontinence, genital sores, muscle weakness, suspicious skin lesions, transient blindness, difficulty walking, depression, unusual weight change, abnormal bleeding, enlarged lymph nodes, angioedema, breast masses, and testicular masses.    Physical Exam  General:  Gen: Ona wheel chair. VS reveiwed, Alert, well developed, nodistress ENT: mucous membranes pink & moist. No abnormal  finds in ear and nose. CVC:S1 S2 , no murmurs, no abnormal heart sounds. Lungs: Clear to auscultation B/L. No wheezes, crackles or other abnormal sounds Abdomen: soft, non distended, no tender. Normal Bowel sounds EXT: no pitting edema, no engorged veins, Pulsations normal  Neuro:alert, oriented *3, cranial nerved 2-12 intact, strenght normal in all  extremities, senstations normal to light touch.      Impression & Recommendations:  Problem # 1:  HYPERLIPIDEMIA (ICD-272.4) will change lipitor to simvastatin as her pharmacy does not have it on formularly time for FLP and CMET  Her updated medication list  for this problem includes:    Simvastatin 40 Mg Tabs (Simvastatin) .Marland Kitchen... Take 1 tablet by mouth once a day  Orders: T-Lipid Profile ([id number]) T-CMP with Estimated GFR ([id number])  Problem # 2:  COUGH (ICD-786.2) viral bronchitis made worse with smoking   plan - antitussive  - supportive care - follow up in 2 weeks if cough not better, consider Xray and Antibiotic at that time  Problem # 3:  HYPERTENSION (ICD-401.9)  stable  Her updated medication list for this problem includes:    Maxzide-25 37.5-25 Mg Tabs (Triamterene-hctz) .Marland Kitchen... Take 1 tablet by mouth once a day  BP today: 123/83 Prior BP: 123/84 (09/28/2009)  Labs Reviewed: K+: 3.6 (09/06/2009) Creat: : 0.96 (09/06/2009)   Chol: 164 (09/06/2009)   HDL: 33 (09/06/2009)   LDL: 104 (09/06/2009)   TG: 136 (09/06/2009)  Complete Medication List: 1)  Cyclobenzaprine Hcl 10 Mg Tabs (Cyclobenzaprine hcl) .... Take 1 tablet by mouth three times a day 2)  Maxzide-25 37.5-25 Mg Tabs (Triamterene-hctz) .... Take 1 tablet by mouth once a day 3)  Effexor Xr 150 Mg Cp24 (Venlafaxine hcl) .... Take 1 tablet by mouth once a day 4)  Nortriptyline Hcl 50 Mg Caps (Nortriptyline hcl) .... Take 1 tablet by mouth at bedtime 5)  Locoid 0.1 % Crea (Hydrocortisone butyrate) .... Apply to face two times a day 6)  Simvastatin 40 Mg Tabs (Simvastatin) .... Take 1 tablet by mouth once a day 7)  Ultram Er 100 Mg Tb24 (Tramadol hcl) .... Take one tablet 4 times daily. 8)  Omeprazole 20 Mg Cpdr (Omeprazole) .... Take 1 tablet by mouth two times a day 9)  Flonase 50 Mcg/act Susp (Fluticasone propionate) .... Two sprays per nostril daily 10)  Miralax Powd (Polyethylene glycol 3350) .... Take 17g daily in liquid for constipation. 11)  Benzonatate 200 Mg Caps (Benzonatate) .Marland Kitchen.. 1 tablet every 6 hrs as needed for cough  Patient Instructions: 1)  Please schedule a follow-up appointment in 6 months. Call earlier if your cough is getting worse 2)  Stop  Smoking Tips: Choose a Quit date. Cut down before the Quit date. decide what you will do as a substitute when you feel the urge to smoke(gum,toothpick,exercise). 3)  Get plenty of rest, drink lots of clear liquids, and use Tylenol or Ibuprofen for fever and comfort. Return in 7-10 days if you're not better:sooner if you're feeling worse. Prescriptions: BENZONATATE 200 MG CAPS (BENZONATATE) 1 tablet every 6 hrs as needed for cough  #40 x 0   Entered and Authorized by:   Bethel Born MD   Signed by:   Bethel Born MD on 05/23/2010   Method used:   Electronically to        [facility]* (retail)       [address]       [facility]  [place], [place]  [address]       Ph: [phone] or [phone]       Fax: [phone]   RxID:   [id number] SIMVASTATIN 40 MG TABS (SIMVASTATIN) Take 1 tablet by mouth once a day  #31 x 6   Entered and Authorized by:   Bethel Born MD   Signed by:   Bethel Born MD on 05/23/2010   Method used:   Electronically to        [facility]* (retail)       [address] [place], [place]  [address]       Ph: [phone] or [phone]       Fax: [phone]   RxID:   [id number]    Orders Added: 1)  Est. Patient Level IV [57322] 2)  T-Lipid Profile [[id number]] 3)  T-CMP with Estimated GFR [[id number]]    Process Orders Check Orders Results:     [facility]: Check successful Tests Sent for requisitioning (May 23, 2010 10:50 AM):     05/23/2010: [facility] -- T-Lipid Profile [phone] (signed)     05/23/2010: [facility] -- T-CMP with Estimated GFR [[id number]] (signed)

## 2010-06-08 ENCOUNTER — Ambulatory Visit (HOSPITAL_COMMUNITY)
Admission: RE | Admit: 2010-06-08 | Discharge: 2010-06-08 | Disposition: A | Discharge status: Home / Self Care | Payer: Medicare Other | Source: Ambulatory Visit | Attending: Internal Medicine | Admitting: Internal Medicine

## 2010-06-08 DIAGNOSIS — Z1231 Encounter for screening mammogram for malignant neoplasm of breast: Secondary | ICD-10-CM | POA: Insufficient documentation

## 2010-06-23 ENCOUNTER — Encounter: Payer: Self-pay | Admitting: Internal Medicine

## 2010-09-06 ENCOUNTER — Ambulatory Visit (INDEPENDENT_AMBULATORY_CARE_PROVIDER_SITE_OTHER): Payer: Medicare Other | Admitting: Internal Medicine

## 2010-09-06 DIAGNOSIS — F329 Major depressive disorder, single episode, unspecified: Secondary | ICD-10-CM

## 2010-09-06 DIAGNOSIS — F17200 Nicotine dependence, unspecified, uncomplicated: Secondary | ICD-10-CM

## 2010-09-06 DIAGNOSIS — F3289 Other specified depressive episodes: Secondary | ICD-10-CM

## 2010-09-06 DIAGNOSIS — M5030 Other cervical disc degeneration, unspecified cervical region: Secondary | ICD-10-CM

## 2010-09-06 NOTE — Assessment & Plan Note (Signed)
Pt is here today for power wheel chair evaluation. She is currently in a power wheel chair which she received in 2006. She is having issues with the wheel and the electronics in the current chair, which causes inability of the chair to move at times. Patient has cervical degenerative disc disease, lumbar degenerative disc disease and thoracic pain. She has severe pain in her back and left leg. She is unable to take more than one, possibly 2 steps while holding on for assistance and must sit down after one step due to pain in her back and leg. She wears a brace on her left leg all day long except when in bed, which helps keep her leg from shaking. Patient is unable to use a walker in her home due to pain and lack of LE strength. She also has difficulty with food preparation, toiletry and moving from room to room without the aid of a power wheel chair. She is unable to self propel a manual wheel chair also due to pain in her arms and back due to degenerative disc disease. For this same reason, the patient cannot operate a scooter due to tiller steering required. Patient is mentally and physically capable of operating a power wheelchair and agrees to use it consistently in her home. Her home environment is suitable for a power wheelchair. Patient lives alone and patient has a Barista 7 days a week, 4.5 hours per day although she can independently transfer to and from the power wheelchair. I recommend patient be provided a power wheel chair so that she can continue to live independently in her home.

## 2010-09-06 NOTE — Assessment & Plan Note (Addendum)
Chronic. Pt lives alone and does not like to socialize. She had been dealing with personal family issues over the past few months but these issues are slowly resolving. She states that her medication does help occasionally. She does not like to socialize much and only goes out for clinic appointments or church. Will refer to SW for counseling and providing information regarding community resources that are available for her age group.

## 2010-09-06 NOTE — Patient Instructions (Signed)
Please continue to take all your medications as prescribed. Let us know if you are having any problems obtaining your wheel chair. Call us if you have any other questions or concerns.

## 2010-09-06 NOTE — Discharge Summary (Signed)
NAME:  Miranda Gaines, Miranda Gaines NO.:  [id number]   MEDICAL RECORD NO.:  [id number]          PATIENT TYPE:  INP   LOCATION:  3739                         FACILITY:  [facility]   PHYSICIAN:  Vassie Loll, MD      DATE OF BIRTH:  07-07-1955   DATE OF ADMISSION:  05/06/2007  DATE OF DISCHARGE:  05/10/2007                               DISCHARGE SUMMARY   DISCHARGE DIAGNOSES:  Dysphagia and odynophagia secondary to acute  esophagitis who also has hypertension, high cholesterol, depression,  hard of hearing secondary to multiple infections worse in the right ear  and also laminectomy with disk replacement in the lumbar column, pyloric  stenosis status post dilation during endoscopy.   DISCHARGE MEDICATION WITH EXACT DOSES:  1. Triamterene/HCTZ 25/37.5 mg 1 tab by mouth daily.  2. Omeprazole 40 mg 1 tab by mouth twice a day.  3. Lipitor 40 mg 1 tab by mouth daily.  4. Nicotine patch 21 mg 1 patch every 24 hours.  5. Neurontin 600 mg 3 times a day.  6. Effexor XR 150 mg by mouth daily.  7. Nortriptyline 50 mg 1 tab by mouth at bedtime once a day.   DISPOSITION/FOLLOW UP:  The patient was discharged home in stable  condition with instructions to avoid citrus and also to avoid hot meals  until the esophagitis resolves.  She will be followed by Dr. Jeani Hawking, gastroenterology on June 03, 2007 at 10 in the morning  regarding at this time biopsy results of endoscopy in order to rule out  eosinophilic esophagitis.  The patient will also follow in our  outpatient clinic with Dr. Elby Showers on May 17, 2007 at 10:30  in the morning, during this visit they are going to check a BMET in  order to check status of her hypokalemia and to see if she has been  tolerating well her po intakes.   PROCEDURES PERFORMED:  EKG that actually demonstrates no acute changes  and no findings for ischemic event.  A chest x-ray without acute  findings.  Normal heart size.  No pulmonary disease.   Endoscopy that  actually showed acute esophagitis and also pyloric stenosis that  received dilation by balloon.   CONSULTATIONS:  Gastroenterology.   BRIEF ADMITTING HISTORY AND PHYSICAL:  We have a 55 year old female who  has past medical history of hypertension, hyperlipidemia, and tobacco  abuse, who came to the outpatient clinic after her primary care  physician ordered her to come since she was actually experiencing chest  pain with an intensity of 7/10 localized in the middle of her chest  without radiation, burning in quality, aggravated by food and also  aggravated by laying down flat.  The patient referred that the pain was  actually also aggravated at the moment the doctor in the outpatient  clinic was checking her mouth and depressed with a tongue stick in order  to see the throat and that actually produced a gag reflex and aggravate  her pain.  The pain was alleviated by sitting up; it was associated with  dysphagia, odynophagia and tachycardia.  All of this for the last 4 days  prior to admission.  The patient denies fever, denies chills, dyspnea,  orthopnea but complains of heat intolerance, anorexia  and constipation.   At the moment the patient arrived, her labs were sodium 134, potassium  3.6, chloride 100, bicarb 22, BUN 20, creatinine 1.12, glucose 133.  Vital signs are temperature of 97.8, blood pressure 138/98, heart rate  96, respiratory rate 20, oxygen saturation 98% on room air.  We also  have CBC, which actually showed a hemoglobin of 15.2, hematocrit of 45,  white blood cells 6.7, platelets 236, calcium 9.8.   PROBLEM:  1. Chest pain.  The patient at the moment that she arrived with past      medical history important for coronary artery disease,      hypertension, hyperlipidemia and status post menopausal and also      history of tobacco abuse immediately received EKG that      demonstrated no changes significant for ischemic event. Cardiac      enzymes x  3 were negative, and at this point with appropiate work      up we ruled out any heart involvement from her chest pain.   1. Dysphagia and odynophagia.  In order to rule out the cause of this      problem, we consulted the gastroenterologyst on call,  Dr. Jeani Hawking in order to discuss the case and decide which test would be      order first, endoscopy or barium swallow test.  Dr. Elnoria Howard decided to      perform endoscopy on the patient that demonstrates esophagitis and      also demonstrates pyloric stenosis that was dilated by balloon      during the procedure.  The patient was started at this moment on      Prilosec 40 mg twice a day in order to prevent more damage to the      esophagus from the stomach acids.   1. Hypertension. Her blood pressure was a little bit elevated during      this admission since the patient presented with low potassium on      the second day and without eating because of the dysphagia and      odynophagia, we had to stop her medication for the blood pressure      that was actually one of the reasons for her low potassium.  At      that moment, we actually decided to start the patient instead of      HCTZ on triamterene/HCTZ in order to preserve the potassium as much      as possible with the same blood pressure reduction that was offered      by the HCTZ.  The blood pressure was actually pretty well      controlled and the patient was having a potassium in the normal      range at the moment of discharge.   1. Hyperlipidemia.  The patient actually presented increased levels of      LDL and low level of HDL.  She was using Lipitor 20 mg once a day.      For that reason,  in order to decrease the cholesterol level, we      decided to change to 40 mg daily.  The patient will need on her      second follow up appointment a fasting lipid profile in  order to      check if the new dose of the medication has succesfully decrease      her LDL level.    DISCHARGE LABORATORY DATA AND VITALS:  At the moment of discharge, vital  signs:  Temperature 97.5, blood pressure 114/73, heart rate 89,  respiratory rate 18, oxygen saturation 97% on room air.   Labs at the moment of discharge:  Sodium 136, potassium 3.7, chloride  100, bicarb 21, BUN 15, creatinine 1.1, blood sugar 110.      Vassie Loll, MD      Vassie Loll, MD  Electronically Signed    CM/MEDQ  D:  05/13/2007  T:  05/14/2007  Job:  [id number]   cc:   Elby Showers, MD

## 2010-09-06 NOTE — Progress Notes (Signed)
  Subjective:    Patient ID: Miranda Gaines, female    DOB: 1955/12/22, 55 y.o.   MRN: [id number]  HPI  CC: Patient here for a power wheel chair assessment.    Miranda Gaines is a 55 y/o woman with HTN, HL, degenerative disc disease of the cervical and lumbar spine, thoracic pain and chronic pain from her degenerative disc disease and neuropathy here for a power wheel chair assessment.    Review of Systems  Constitutional: Negative for fever and unexpected weight change.  Respiratory: Negative for shortness of breath.   Cardiovascular: Negative for chest pain and palpitations.  Gastrointestinal: Negative for nausea, vomiting and abdominal pain.  Genitourinary: Negative for dysuria and frequency.  Musculoskeletal: Positive for back pain.  Neurological: Positive for weakness. Negative for numbness.       Objective:   Physical Exam  Constitutional: She is oriented to person, place, and time. She appears well-developed and well-nourished. No distress.  Cardiovascular: Normal rate, regular rhythm and normal heart sounds.  Exam reveals no gallop and no friction rub.   No murmur heard. Pulmonary/Chest: Effort normal and breath sounds normal. No respiratory distress. She has no wheezes. She has no rales.  Abdominal: Soft. Bowel sounds are normal. There is no tenderness.  Musculoskeletal:       Spinal and paraspinal muscle tender to palpation noted throughout the mid-thoracic to lower lumbar levels.  Neurological: She is alert and oriented to person, place, and time.       Right upper extremity strength is reduced and graded at 3/5. Mild reduction in left upper extremity strength at 4/5. Mildly reduced right lower extremity strength at 4/5 and decreased left lower extremity strength at 3/5. Sensation intact to light touch throughout.   Psychiatric: She has a normal mood and affect.          Assessment & Plan:

## 2010-09-06 NOTE — Assessment & Plan Note (Signed)
Quit smoking 3 months ago. She has been doing relatively well since then. Made aware that we have a social worker who can provide counseling services and other resources regarded smoking cessation if she needs it.

## 2010-09-06 NOTE — Consult Note (Signed)
NAMEMarland Kitchen  Miranda Gaines, Miranda Gaines              ACCOUNT NO.:  [id number]   MEDICAL RECORD NO.:  [id number]          PATIENT TYPE:  INP   LOCATION:  3739                         FACILITY:  [facility]   PHYSICIAN:  Jordan Hawks. Elnoria Howard, MD    DATE OF BIRTH:  [birth year]   DATE OF CONSULTATION:  05/07/2007  DATE OF DISCHARGE:                                 CONSULTATION   REFERRING PHYSICIAN:  Teaching service   HISTORY OF PRESENT ILLNESS:  This is a 55 year old female with past  medical history of otitis media with resultant difficulty of hearing,  hypertension, hyperlipidemia, depression, and a ruptured lumbar disk who  presented to the hospital with complaints of odynophagia and dysphagia  for the past 4-5 days.  The patient reports that her symptoms started  acutely in the past. She has a history of gastroesophageal reflux  disease, and she was treated briefly with an antacid. However, for  reasons that are not clear to her she was not maintained on medication.  She only stated that the medication was beneficial for approximately 1  day and did not have continued lasting effects.  The patient is a poor  historian in regards to her past medical history.  She denies taking any  new medications during this type of episode.  The patient presented to  the outpatient clinic because she had complaints of chest pain, and  subsequently she was admitted to the hospital for further evaluation and  treatment.  She does report that she has some vomiting of mucous type of  material and she feels that any type of p.o. intake, whether it be solid  or liquid foods, will become lodged in her esophagus.  No hematemesis  reported.   PAST MEDICAL AND SURGICAL HISTORY:  As stated above.   FAMILY HISTORY:  Noncontributory.   ALLERGIES:  To morphine, penicillin, aspirin and codeine   MEDICATIONS:  Protonix, potassium chloride, and morphine.   SOCIAL HISTORY:  Negative for alcohol or illicit drug use.  Positive for  tobacco  use, half pack per day for the past 30 years.   FAMILY HISTORY:  Noncontributory.   REVIEW OF SYSTEMS:  As stated above in the history present of illness.  Otherwise negative.   PHYSICAL EXAMINATION:  VITAL SIGNS:  Blood pressure is 108/75, heart  rate is 74, respirations 20, temperature is 98.2.  GENERAL:  The patient is in no acute distress.  HEENT:  Normocephalic, atraumatic.  Extraocular muscles are intact.  NECK:  Supple.  No lymphadenopathy.  LUNGS:  Clear to auscultation bilaterally.  CARDIOVASCULAR:  Regular rate and rhythm.  ABDOMEN:  Soft, mild tenderness in the epigastric region.  No rebound or  rigidity.  Positive bowel sounds.  EXTREMITIES:  No clubbing, cyanosis or edema.   LABORATORY VALUES:  White blood cell count is 6.2, hemoglobin 13, MCV is  91.9, platelets at 198.  Sodium 135, potassium 3, chloride 102, CO2 of  27, glucose 127, BUN 14, creatinine 1.   IMPRESSION:  1. Dysphagia and odynophagia.  2. Gastroesophageal reflux disease.   RECOMMENDATIONS:  At this time there is no  clear evidence that she has  no oral candidiasis with although esophageal candidiasis can still be a  consideration.  She does have a history of gastroesophageal esophageal  reflux disease, and this may be a manifestation of a reflux esophagitis.  Further evaluation and treatment will be made pending the EGD which will  be performed on May 09, 2007 secondary to scheduling issues.      Jordan Hawks Elnoria Howard, MD  Electronically Signed     PDH/MEDQ  D:  05/07/2007  T:  05/08/2007  Job:  [id number]   cc:   Teaching Service

## 2010-09-20 ENCOUNTER — Telehealth: Payer: Self-pay | Admitting: *Deleted

## 2010-09-20 NOTE — Telephone Encounter (Signed)
Call from [facility] with [facility] stating she needs paperwork for power chair filled out on sheet without logo. She will fax to [place].

## 2010-11-17 ENCOUNTER — Other Ambulatory Visit: Payer: Self-pay | Admitting: Internal Medicine

## 2010-12-19 ENCOUNTER — Other Ambulatory Visit: Payer: Self-pay | Admitting: Internal Medicine

## 2010-12-20 ENCOUNTER — Other Ambulatory Visit: Payer: Self-pay | Admitting: *Deleted

## 2010-12-20 MED ORDER — TRIAMTERENE-HCTZ 37.5-25 MG PO TABS: 1.0000 | ORAL_TABLET | Freq: Every day | ORAL | Status: DC

## 2010-12-20 MED ORDER — OMEPRAZOLE 40 MG PO CPDR: 40.0000 mg | DELAYED_RELEASE_CAPSULE | Freq: Every day | ORAL | Status: DC

## 2010-12-20 MED ORDER — SIMVASTATIN 40 MG PO TABS: 40.0000 mg | ORAL_TABLET | Freq: Every day | ORAL | Status: DC

## 2010-12-20 NOTE — Telephone Encounter (Signed)
Request is for atorvastatin 40 mg 1 daily.

## 2011-01-11 LAB — ANA: Anti Nuclear Antibody(ANA): NEGATIVE

## 2011-01-11 LAB — CBC
HCT: 33.2 — ABNORMAL LOW
HCT: 38.8
HCT: 44.6
Hemoglobin: 11.2 — ABNORMAL LOW
Hemoglobin: 13
Hemoglobin: 15.2 — ABNORMAL HIGH
MCHC: 33.5
MCHC: 33.7
MCHC: 34
MCV: 90.7
MCV: 91.5
MCV: 91.9
Platelets: 164
Platelets: 198
Platelets: 236
RBC: 3.63 — ABNORMAL LOW
RBC: 4.22
RBC: 4.92
RDW: 13.4
RDW: 13.6
RDW: 13.8
WBC: 6.2
WBC: 6.4
WBC: 6.7

## 2011-01-11 LAB — LIPID PANEL
Cholesterol: 225 — ABNORMAL HIGH
HDL: 24 — ABNORMAL LOW
LDL Cholesterol: 176 — ABNORMAL HIGH
Total CHOL/HDL Ratio: 9.4
Triglycerides: 127
VLDL: 25

## 2011-01-11 LAB — BASIC METABOLIC PANEL
BUN: 14
BUN: 20
BUN: 7
CO2: 22
CO2: 27
CO2: 27
Calcium: 8.7
Calcium: 9
Calcium: 9.8
Chloride: 100
Chloride: 102
Chloride: 111
Creatinine, Ser: 0.9
Creatinine, Ser: 1.02
Creatinine, Ser: 1.12
GFR calc Af Amer: >60
GFR calc Af Amer: >60
GFR calc Af Amer: >60
GFR calc non Af Amer: 51 — ABNORMAL LOW
GFR calc non Af Amer: 57 — ABNORMAL LOW
GFR calc non Af Amer: >60
Glucose, Bld: 127 — ABNORMAL HIGH
Glucose, Bld: 133 — ABNORMAL HIGH
Glucose, Bld: 98
Potassium: 3 — ABNORMAL LOW
Potassium: 3.6
Potassium: 3.8
Sodium: 134 — ABNORMAL LOW
Sodium: 135
Sodium: 143

## 2011-01-11 LAB — COMPREHENSIVE METABOLIC PANEL
ALT: 14
AST: 17
Albumin: 4.4
Alkaline Phosphatase: 83
BUN: 19
CO2: 23
Calcium: 9.6
Chloride: 102
Creatinine, Ser: 1.14
GFR calc Af Amer: >60
GFR calc non Af Amer: 50 — ABNORMAL LOW
Glucose, Bld: 124 — ABNORMAL HIGH
Potassium: 3.6
Sodium: 136
Total Bilirubin: 0.5
Total Protein: 7.9

## 2011-01-11 LAB — CARDIAC PANEL(CRET KIN+CKTOT+MB+TROPI)
CK, MB: 2.1
CK, MB: 2.2
CK, MB: 2.3
Relative Index: 1.1
Relative Index: 1.3
Relative Index: 1.5
Total CK: 149
Total CK: 168
Total CK: 183 — ABNORMAL HIGH
Troponin I: 0.01
Troponin I: 0.03
Troponin I: 0.03

## 2011-01-11 LAB — HEMOGLOBIN A1C
Hgb A1c MFr Bld: 5.7
Mean Plasma Glucose: 126

## 2011-01-11 LAB — TSH: TSH: 1.846

## 2011-01-12 LAB — CBC
HCT: 32.9 — ABNORMAL LOW
HCT: 38.1
Hemoglobin: 11.1 — ABNORMAL LOW
Hemoglobin: 12.8
MCHC: 33.6
MCHC: 33.6
MCV: 91.8
MCV: 92.2
Platelets: 176
Platelets: 201
RBC: 3.59 — ABNORMAL LOW
RBC: 4.13
RDW: 13.9
RDW: 13.9
WBC: 6.6
WBC: 6.9

## 2011-01-12 LAB — BASIC METABOLIC PANEL
BUN: 5 — ABNORMAL LOW
BUN: 7
CO2: 22
CO2: 28
Calcium: 8.7
Calcium: 9.5
Chloride: 107
Chloride: 112
Creatinine, Ser: 0.97
Creatinine, Ser: 1.02
GFR calc Af Amer: >60
GFR calc Af Amer: >60
GFR calc non Af Amer: 57 — ABNORMAL LOW
GFR calc non Af Amer: >60
Glucose, Bld: 80
Glucose, Bld: 98
Potassium: 3.4 — ABNORMAL LOW
Potassium: 3.6
Sodium: 138
Sodium: 142

## 2011-01-19 ENCOUNTER — Other Ambulatory Visit: Payer: Self-pay | Admitting: Internal Medicine

## 2011-01-19 MED ORDER — ATORVASTATIN CALCIUM 40 MG PO TABS: 40.0000 mg | ORAL_TABLET | Freq: Every day | ORAL | Status: DC

## 2011-02-02 ENCOUNTER — Ambulatory Visit: Payer: Medicare Other

## 2011-02-02 ENCOUNTER — Ambulatory Visit (INDEPENDENT_AMBULATORY_CARE_PROVIDER_SITE_OTHER): Payer: Medicare Other | Admitting: *Deleted

## 2011-02-02 DIAGNOSIS — Z23 Encounter for immunization: Secondary | ICD-10-CM

## 2011-04-14 ENCOUNTER — Other Ambulatory Visit: Payer: Self-pay | Admitting: *Deleted

## 2011-04-14 MED ORDER — OMEPRAZOLE 40 MG PO CPDR: 40.0000 mg | DELAYED_RELEASE_CAPSULE | Freq: Every day | ORAL | Status: DC

## 2011-04-20 ENCOUNTER — Other Ambulatory Visit: Payer: Self-pay | Admitting: *Deleted

## 2011-04-20 MED ORDER — HYDROCORTISONE BUTYRATE 0.1 % EX CREA: 1.0000 "application " | TOPICAL_CREAM | Freq: Two times a day (BID) | CUTANEOUS | Status: DC

## 2011-04-26 ENCOUNTER — Encounter: Payer: Self-pay | Admitting: Internal Medicine

## 2011-04-26 ENCOUNTER — Ambulatory Visit (INDEPENDENT_AMBULATORY_CARE_PROVIDER_SITE_OTHER): Payer: Medicare Other | Admitting: Internal Medicine

## 2011-04-26 DIAGNOSIS — F329 Major depressive disorder, single episode, unspecified: Secondary | ICD-10-CM

## 2011-04-26 DIAGNOSIS — F3289 Other specified depressive episodes: Secondary | ICD-10-CM

## 2011-04-26 DIAGNOSIS — L219 Seborrheic dermatitis, unspecified: Secondary | ICD-10-CM

## 2011-04-26 DIAGNOSIS — E119 Type 2 diabetes mellitus without complications: Secondary | ICD-10-CM

## 2011-04-26 DIAGNOSIS — E785 Hyperlipidemia, unspecified: Secondary | ICD-10-CM

## 2011-04-26 DIAGNOSIS — K219 Gastro-esophageal reflux disease without esophagitis: Secondary | ICD-10-CM

## 2011-04-26 DIAGNOSIS — J329 Chronic sinusitis, unspecified: Secondary | ICD-10-CM

## 2011-04-26 LAB — POCT GLYCOSYLATED HEMOGLOBIN (HGB A1C): Hemoglobin A1C: 5.7

## 2011-04-26 LAB — GLUCOSE, CAPILLARY: Glucose-Capillary: 91 mg/dL (ref 70–99)

## 2011-04-26 MED ORDER — ESOMEPRAZOLE MAGNESIUM 40 MG PO PACK: 40.0000 mg | PACK | Freq: Every day | ORAL | Status: DC

## 2011-04-26 NOTE — Assessment & Plan Note (Signed)
Improved after restarting hydrocortisone cream. Recommended to followup with Dr. Terri Piedra. Recommended to continue current regimen.

## 2011-04-26 NOTE — Assessment & Plan Note (Signed)
The insurance does not pay omeprazole 40 mg anymore therefore will change to Nexium.

## 2011-04-26 NOTE — Progress Notes (Signed)
Subjective:   Patient ID: Miranda Gaines female   DOB: [birth year] 56 y.o.   MRN: [id number]  HPI: Miranda Gaines is a 56 y.o. female with past medical history significant as outlined below who presented to the clinic with improving facial rash and sinus symptoms. Patient reports that she ran out off her hydrocortisone cream, and her facial rash should worsen. She had seborrheic dermatitis and is followed up by Dr.Luptom. She noted since she has been using the cream it is improving. She reports about a sinus symptoms which has been chronic since months. She feels that whenever she lies: Either left or right side she experienced a fullness in her ears and some dizziness. Furthermore she noticed stuffed and runny nose on a daily basis. She describes that she is hearing her voice inside her head as if there was an echo. Has been using Flonase on a daily basis. Denies any fevers or chills, cough, shortness of breath. Has history of hearing loss and is using hearing aid.    Past Medical History  Diagnosis Date  . Hyperlipidemia   . Hypertension   . GERD (gastroesophageal reflux disease)   . Depression     f/b Dr. Cardell Peach at [facility]  . Degenerative disc disease, cervical     L spine 10/09 showing DJD and cervical spurring but no specific foraminal narrowing. Has been followed by Rochele Pages, pain management physician in [place].  . Pyloric stenosis     s/p EGD with balloon dilatation on 06/23/2007 by Dr. Elnoria Howard  . Tobacco abuse     Did not tolerate Chantix (vomiting)  . Wears hearing aid     In r. ear, followed by ENT. 2/2 recurrent otitis  . Seborrheic dermatitis     of face, sees Dr. Merlyn Albert Luptom, dermatop cream TID  . Herniated disc     L5-S1. s/p laminectomy in 1993 by Dr. Shelle Iron.  MRI June 2005 showing L4/5 disc bulge + mild facet arthropathy. F/b Dr. Marzetta Board for pain management. Wheel chair bound.   Current Outpatient Prescriptions  Medication Sig Dispense Refill  .  atorvastatin (LIPITOR) 40 MG tablet Take 1 tablet (40 mg total) by mouth daily.  31 tablet  11  . benzonatate (TESSALON) 200 MG capsule 1 tablet every 6 hours as needed for cough       . cyclobenzaprine (FLEXERIL) 10 MG tablet Take 10 mg by mouth 3 (three) times daily as needed.        . fluticasone (FLONASE) 50 MCG/ACT nasal spray USE 2 SPRAYS INTO EACH NOSTRIL EVERY DAY  16 g  5  . hydrocortisone butyrate (LUCOID) 0.1 % CREA cream Apply 1 application topically 2 (two) times daily.  45 g  2  . nortriptyline (PAMELOR) 50 MG capsule Take 50 mg by mouth at bedtime.        Marland Kitchen omeprazole (PRILOSEC) 40 MG capsule Take 1 capsule (40 mg total) by mouth daily.  31 capsule  3  . polyethylene glycol (MIRALAX / GLYCOLAX) packet Take 17 g by mouth daily.        . polyethylene glycol powder (GLYCOLAX/MIRALAX) powder MIX 17 GRAMS IN A GLASS OF LIQUID AND DRINK ONCE A DAY  527 g  prn  . traMADol (ULTRAM-ER) 100 MG 24 hr tablet 1 tablet by mouth 4 times daily       . triamterene-hydrochlorothiazide (MAXZIDE-25) 37.5-25 MG per tablet Take 1 each (1 tablet total) by mouth daily.  31 tablet  3  .  venlafaxine (EFFEXOR-XR) 150 MG 24 hr capsule Take 150 mg by mouth daily.         Review of Systems: Constitutional: Denies fever, chills, diaphoresis, appetite change and fatigue.  HEENT: Denies photophobia, eye pain, redness, hearing loss, ear pain, congestion, sore throat, rhinorrhea, sneezing, mouth sores, trouble swallowing, neck pain, neck stiffness and tinnitus.   Respiratory: Denies SOB, DOE, cough, chest tightness,  and wheezing.   Cardiovascular: Denies chest pain, palpitations and leg swelling.  Gastrointestinal: Denies nausea, vomiting, abdominal pain, diarrhea, constipation, blood in stool and abdominal distention.  Genitourinary: Denies dysuria, urgency, frequency, hematuria, flank pain and difficulty urinating.  Musculoskeletal: Denies myalgias, back pain, joint swelling, arthralgias and gait problem.  Skin:  Denies pallor, rash and wound.  Neurological: Denies dizziness, seizures, syncope, weakness, light-headedness, numbness and headaches.  Hematological: Denies adenopathy. Easy bruising, personal or family bleeding history  Psychiatric/Behavioral: Denies suicidal ideation, mood changes, confusion, nervousness, sleep disturbance and agitation  Objective:  Physical Exam: Filed Vitals:   04/26/11 1035  BP: 135/87  Pulse: 82  Temp: 97.7 F (36.5 C)  TempSrc: Oral  Height: 5\' 5"  (1.651 m)  Weight: 164 lb 11.2 oz (74.707 kg)   Constitutional: Vital signs reviewed.  Patient is a well-developed and well-nourished  in no acute distress and cooperative with exam. Alert and oriented x3.  Head: Normocephalic and atraumatic Ear: TM normal bilaterally Mouth: no erythema or exudates, MMM Face: Mild Seborrheic Dermatitis on face notable. Tenderness on palpation throughout.  Eyes: PERRL, EOMI, conjunctivae normal, No scleral icterus.  Neck: Supple,  normal ROM,  Cardiovascular: RRR, S1 normal, S2 normal, no MRG, pulses symmetric and intact bilaterally Pulmonary/Chest: CTAB, no wheezes, rales, or rhonchi Abdominal: Soft. Non-tender, non-distended, bowel sounds are normal,  Neurological: A&O x3,  no focal motor deficit, sensory intact to light touch bilaterally.

## 2011-04-26 NOTE — Assessment & Plan Note (Signed)
The patient has chronic sinusitis and is currently using Flonase on a regular basis without any significant improvement. Has not been by ENT. I will obtain a CT of the sinuses for further evaluation and management. At this point she has failed intranasal steroids. We may have to proceed with more intensive medication therapy including oral steroids. May consider to refer to ENT.

## 2011-04-28 ENCOUNTER — Encounter: Payer: Self-pay | Admitting: Internal Medicine

## 2011-04-28 ENCOUNTER — Ambulatory Visit (HOSPITAL_COMMUNITY)
Admission: RE | Admit: 2011-04-28 | Discharge: 2011-04-28 | Disposition: A | Discharge status: Home / Self Care | Payer: Medicare Other | Source: Ambulatory Visit | Attending: Internal Medicine | Admitting: Internal Medicine

## 2011-04-28 DIAGNOSIS — J3489 Other specified disorders of nose and nasal sinuses: Secondary | ICD-10-CM | POA: Insufficient documentation

## 2011-05-04 DIAGNOSIS — M5137 Other intervertebral disc degeneration, lumbosacral region: Secondary | ICD-10-CM | POA: Insufficient documentation

## 2011-05-16 ENCOUNTER — Other Ambulatory Visit: Payer: Self-pay | Admitting: *Deleted

## 2011-05-16 DIAGNOSIS — I10 Essential (primary) hypertension: Secondary | ICD-10-CM

## 2011-05-16 MED ORDER — TRIAMTERENE-HCTZ 37.5-25 MG PO TABS: 1.0000 | ORAL_TABLET | Freq: Every day | ORAL | Status: DC

## 2011-06-01 ENCOUNTER — Encounter: Payer: Self-pay | Admitting: Internal Medicine

## 2011-06-01 ENCOUNTER — Ambulatory Visit (INDEPENDENT_AMBULATORY_CARE_PROVIDER_SITE_OTHER): Payer: Medicare Other | Admitting: Internal Medicine

## 2011-06-01 DIAGNOSIS — E785 Hyperlipidemia, unspecified: Secondary | ICD-10-CM

## 2011-06-01 DIAGNOSIS — Z789 Other specified health status: Secondary | ICD-10-CM

## 2011-06-01 DIAGNOSIS — I10 Essential (primary) hypertension: Secondary | ICD-10-CM

## 2011-06-01 DIAGNOSIS — L219 Seborrheic dermatitis, unspecified: Secondary | ICD-10-CM

## 2011-06-01 DIAGNOSIS — J329 Chronic sinusitis, unspecified: Secondary | ICD-10-CM

## 2011-06-01 MED ORDER — HYDROCORTISONE 1 % EX CREA: TOPICAL_CREAM | Freq: Two times a day (BID) | CUTANEOUS | Status: DC

## 2011-06-01 MED ORDER — HYDROCORTISONE BUTYRATE 0.1 % EX CREA: 1.0000 "application " | TOPICAL_CREAM | Freq: Two times a day (BID) | CUTANEOUS | Status: DC

## 2011-06-01 NOTE — Progress Notes (Signed)
Subjective:   Patient ID: Miranda Gaines female   DOB: 18-Jul-1955 56 y.o.   MRN: [id number]  HPI: Ms.Miranda ANGY Gaines is a 56 y.o.   [name] is a 56 y.o. female with past medical history significant as outlined below who presented to for a follow up visit for her chronic facial rash and sinus symptoms.   She reports hat her chronic sinus problem is getting better, but still has minimal facial pain and pressure-like feeling around her ear. She said that he pain is minimal and does not need pain medications at this moment.   Regarding seborrheic dermatitis, she has been using 0.1% of hydrocortisone butyrate cream. Her rashes are well controlled. She wants to have this medications be refilled.  Denies fever, chills, fatigue, cough, chest pain, SOB,  abdominal pain,diarrhea, constipation, dysuria, urgency, frequency, hematuria, joint pain or leg swelling.   Past Medical History  Diagnosis Date  . Hyperlipidemia   . Hypertension   . GERD (gastroesophageal reflux disease)   . Depression     f/b Dr. Cardell Peach at [facility]  . Degenerative disc disease, cervical     L spine 10/09 showing DJD and cervical spurring but no specific foraminal narrowing. Has been followed by Rochele Pages, pain management physician in [place].  . Pyloric stenosis     s/p EGD with balloon dilatation on 06/23/2007 by Dr. Elnoria Howard  . Tobacco abuse     Did not tolerate Chantix (vomiting)  . Wears hearing aid     In r. ear, followed by ENT. 2/2 recurrent otitis  . Seborrheic dermatitis     of face, sees Dr. Merlyn Albert Luptom, dermatop cream TID  . Herniated disc     L5-S1. s/p laminectomy in 1993 by Dr. Shelle Iron.  MRI June 2005 showing L4/5 disc bulge + mild facet arthropathy. F/b Dr. Marzetta Board for pain management. Wheel chair bound.   Current Outpatient Prescriptions  Medication Sig Dispense Refill  . atorvastatin (LIPITOR) 40 MG tablet Take 1 tablet (40 mg total) by mouth daily.  31 tablet  11  .  cyclobenzaprine (FLEXERIL) 10 MG tablet Take 10 mg by mouth 3 (three) times daily as needed.        Marland Kitchen esomeprazole (NEXIUM) 40 MG packet Take 40 mg by mouth daily before breakfast.  30 each  3  . fluticasone (FLONASE) 50 MCG/ACT nasal spray USE 2 SPRAYS INTO EACH NOSTRIL EVERY DAY  16 g  5  . hydrocortisone butyrate (LUCOID) 0.1 % CREA cream Apply 1 application topically 2 (two) times daily.  45 g  3  . nortriptyline (PAMELOR) 50 MG capsule Take 75 mg by mouth at bedtime. Prescribed by [facility].      . polyethylene glycol powder (GLYCOLAX/MIRALAX) powder MIX 17 GRAMS IN A GLASS OF LIQUID AND DRINK ONCE A DAY  527 g  prn  . traZODone (DESYREL) 50 MG tablet Take 50 mg by mouth at bedtime. Prescribed by Mental [facility].       . triamterene-hydrochlorothiazide (MAXZIDE-25) 37.5-25 MG per tablet Take 1 each (1 tablet total) by mouth daily.  31 tablet  6   Family History  Problem Relation Age of Onset  . Coronary artery disease Mother     deceased at 59  . Cancer Father     deceased at 59, lung cancer   History   Social History  . Marital Status: Legally Separated    Spouse Name: N/A    Number of Children: N/A  .  Years of Education: N/A   Social History Main Topics  . Smoking status: Former Games developer  . Smokeless tobacco: None  . Alcohol Use: No  . Drug Use: No  . Sexually Active: None   Other Topics Concern  . None   Social History Narrative   Divorced, no children.Wheelchair bound from chronic back pain 2/2 workplace injury in her 86s. She used to be a Psychologist, occupational.Lives alone in a house.Current smokerNo alcohol or drug use.Menopause in 2001Last sexual activity in 2001   Review of Systems:  General: no fevers, chills, no changes in body weight, no changes in appetite Skin: a few skin rashes on the face. HEENT: no blurry vision,or sore throat. Has chronic hearing loss. Minor pain over maxillary area. Pulm: no dyspnea, coughing, wheezing CV: no chest pain, palpitations, shortness of  breath Abd: no nausea/vomiting, abdominal pain, diarrhea/constipation GU: no dysuria, hematuria, polyuria Ext: no arthralgias, myalgias Neuro: no weakness, numbness, or tingling    Objective:  Physical Exam: Filed Vitals:   06/01/11 0824  BP: 117/78  Pulse: 86  Temp: 98.6 F (37 C)  TempSrc: Oral  Height: 5\' 5"  (1.651 m)  Weight: 165 lb 11.2 oz (75.161 kg)   General:  not in acute distress HEENT: PERRL, EOMI, no scleral icterus. Tender over maxillary area bilaterally. No nasal discharge. Has chronic hearing loss and wears hearing aids bilaterally. Cardiac: S1/S2, RRR, No murmurs, gallops or rubs Pulm: Good air movement bilaterally, Clear to auscultation bilaterally, No rales, wheezing, rhonchi or rubs. Abd: Soft,  nondistended, nontender, no rebound pain, no organomegaly, BS present Ext: No rashes or edema, 2+DP/PT pulse bilaterally Neuro: alert and oriented X3, cranial nerves II-XII grossly intact, muscle strength 5/5 in all extremeties,  sensation to light touch intact.    Assessment & Plan:   # Chronic sinusits: patient's CT scan is negative. Her symptoms are improving. She is on flonase. Will continue with the current regimen.  # Seborrheic dermatitis: well controlled with 1% of hydrocortisone butyrate cream. However, since hydrocortisone butyrate belongs to low to mid potent steroid category. It is not appropraiat to use it on face chronically due to the potetnial skin atrophy side effects. Will change it to 1% of hydrocortison cream.  #: HTN: well controlled. bp is 117/78. Will continue current regimen.  #HLD: LDL was 111 on 05/24/11. AST and ALT were normal. Will continue current regimen  # HM:  1. Patient refused to do colonoscope today. Will get FOBT 2. Patient refused to do pap smear and said that she is not sexually active, does not need this test.

## 2011-06-01 NOTE — Patient Instructions (Addendum)
1. Please take all medications as prescribed.  2. Please change your cream to 1% hydrocortisone cream. You can get it from the over-the-counter medications. 3. Please come to pick up your Focal Occult Blood test card from clinic. 4. If you have worsening of your symptoms or new symptoms arise, please call the clinic ([phone]), or go to the ER immediately if symptoms are severe. 5.

## 2011-06-02 NOTE — Progress Notes (Signed)
I discussed patient with resident Dr. Clyde Lundborg, and I agree with the plans as outlined in his note with the following exceptions and additional comments.  Patient has apparently been on 0.1% hydrocortisone butyrate topically, not 1%; the plan is to change this to hydrocortisone 1% cream (a low potency OTC medication) since it is used on the face.

## 2011-06-05 NOTE — Progress Notes (Signed)
Addended by: Maura Crandall on: 06/05/2011 10:38 AM   Modules accepted: Orders

## 2011-06-30 ENCOUNTER — Other Ambulatory Visit (HOSPITAL_COMMUNITY): Payer: Self-pay | Admitting: Family Medicine

## 2011-06-30 DIAGNOSIS — Z1231 Encounter for screening mammogram for malignant neoplasm of breast: Secondary | ICD-10-CM

## 2011-07-07 ENCOUNTER — Other Ambulatory Visit: Payer: Self-pay | Admitting: Internal Medicine

## 2011-07-27 ENCOUNTER — Ambulatory Visit (HOSPITAL_COMMUNITY): Payer: Medicare Other

## 2011-08-09 ENCOUNTER — Ambulatory Visit (HOSPITAL_COMMUNITY)
Admission: RE | Admit: 2011-08-09 | Discharge: 2011-08-09 | Disposition: A | Discharge status: Home / Self Care | Payer: Medicare Other | Source: Ambulatory Visit | Attending: Family Medicine | Admitting: Family Medicine

## 2011-08-09 DIAGNOSIS — Z1231 Encounter for screening mammogram for malignant neoplasm of breast: Secondary | ICD-10-CM

## 2011-08-25 ENCOUNTER — Other Ambulatory Visit: Payer: Self-pay | Admitting: Internal Medicine

## 2011-11-01 ENCOUNTER — Other Ambulatory Visit: Payer: Self-pay | Admitting: *Deleted

## 2011-11-01 DIAGNOSIS — K5900 Constipation, unspecified: Secondary | ICD-10-CM

## 2011-11-01 MED ORDER — POLYETHYLENE GLYCOL 3350 17 GM/SCOOP PO POWD: 17.0000 g | Freq: Every day | ORAL | Status: DC

## 2011-11-06 ENCOUNTER — Encounter: Payer: Self-pay | Admitting: Internal Medicine

## 2011-11-06 ENCOUNTER — Ambulatory Visit (INDEPENDENT_AMBULATORY_CARE_PROVIDER_SITE_OTHER): Payer: Medicare Other | Admitting: Internal Medicine

## 2011-11-06 ENCOUNTER — Telehealth: Payer: Self-pay | Admitting: *Deleted

## 2011-11-06 VITALS — BP 124/80 | HR 88 | Temp 98.6°F | Ht 65.0 in | Wt 175.0 lb

## 2011-11-06 DIAGNOSIS — Z789 Other specified health status: Secondary | ICD-10-CM | POA: Insufficient documentation

## 2011-11-06 DIAGNOSIS — H6120 Impacted cerumen, unspecified ear: Secondary | ICD-10-CM

## 2011-11-06 DIAGNOSIS — M62838 Other muscle spasm: Secondary | ICD-10-CM

## 2011-11-06 DIAGNOSIS — I10 Essential (primary) hypertension: Secondary | ICD-10-CM

## 2011-11-06 DIAGNOSIS — E785 Hyperlipidemia, unspecified: Secondary | ICD-10-CM

## 2011-11-06 LAB — COMPLETE METABOLIC PANEL WITH GFR
ALT: 13 U/L (ref 0–35)
AST: 15 U/L (ref 0–37)
Albumin: 4.6 g/dL (ref 3.5–5.2)
Alkaline Phosphatase: 70 U/L (ref 39–117)
BUN: 14 mg/dL (ref 6–23)
CO2: 28 mEq/L (ref 19–32)
Calcium: 10.3 mg/dL (ref 8.4–10.5)
Chloride: 102 mEq/L (ref 96–112)
Creat: 1 mg/dL (ref 0.50–1.10)
GFR, Est African American: 73 mL/min
GFR, Est Non African American: 63 mL/min
Glucose, Bld: 89 mg/dL (ref 70–99)
Potassium: 3.8 mEq/L (ref 3.5–5.3)
Sodium: 140 mEq/L (ref 135–145)
Total Bilirubin: 0.3 mg/dL (ref 0.3–1.2)
Total Protein: 7.3 g/dL (ref 6.0–8.3)

## 2011-11-06 LAB — CK: Total CK: 112 U/L (ref 7–177)

## 2011-11-06 MED ORDER — EAR WAX DROPS 6.5 % OT SOLN: OTIC | Status: DC

## 2011-11-06 NOTE — Assessment & Plan Note (Signed)
Patient has extensive ear wax at left ear. We'll treat with ear wax remover drops. There is no signs of infection in her ears bilaterally.

## 2011-11-06 NOTE — Telephone Encounter (Signed)
Pt's mother called and states pt was here today for a physical.  They are asking for a return call about the symptoms she is having. She is wanting to start on Asprin daily to prevent a stroke.  Pt # [phone]

## 2011-11-06 NOTE — Progress Notes (Signed)
Patient ID: Miranda Gaines, female   DOB: [birth year], 56 y.o.   MRN: [id number]  Subjective:   Patient ID: Miranda Gaines female   DOB: 1955/07/03 56 y.o.   MRN: [id number]  HPI: [name] is a 55 y.o. with past medical history as outlined below, who presents for a regular checkup.  Patient reports that she has been taking all her medications regularly. Patient reports that when air condition is cooling down, she has feeling of muscle spasm at both flank areas. This problem has been going on for one month. It is not associated with muscle pain, weakness, numbness or decreased sensation. There's no abdominal pain, pain in flank area, dysuria, urgency or frequency on urination. She  Also has a numbness in her lower lip when she chews food when the room temperature is low. She said the food that she eats is not spicy. When the room is warm, she doesn't have all these problems.   Denies fever, chills, fatigue, headaches,  cough, chest pain, SOB,  diarrhea, constipation or hematuria.  Past Medical History  Diagnosis Date  . Hyperlipidemia   . Hypertension   . GERD (gastroesophageal reflux disease)   . Depression     f/b Dr. Cardell Peach at [facility]  . Degenerative disc disease, cervical     L spine 10/09 showing DJD and cervical spurring but no specific foraminal narrowing. Has been followed by Rochele Pages, pain management physician in [place].  . Pyloric stenosis     s/p EGD with balloon dilatation on 06/23/2007 by Dr. Elnoria Howard  . Tobacco abuse     Did not tolerate Chantix (vomiting)  . Wears hearing aid     In r. ear, followed by ENT. 2/2 recurrent otitis  . Seborrheic dermatitis     of face, sees Dr. Merlyn Albert Luptom, dermatop cream TID  . Herniated disc     L5-S1. s/p laminectomy in 1993 by Dr. Shelle Iron.  MRI June 2005 showing L4/5 disc bulge + mild facet arthropathy. F/b Dr. Marzetta Board for pain management. Wheel chair bound.   Current Outpatient Prescriptions  Medication Sig  Dispense Refill  . atorvastatin (LIPITOR) 40 MG tablet Take 1 tablet (40 mg total) by mouth daily.  31 tablet  11  . cyclobenzaprine (FLEXERIL) 10 MG tablet Take 10 mg by mouth 3 (three) times daily as needed.        Marland Kitchen esomeprazole (NEXIUM) 40 MG packet Take 40 mg by mouth daily before breakfast.  30 each  3  . fluticasone (FLONASE) 50 MCG/ACT nasal spray USE 2 SPRAYS INTO EACH NOSTRIL EVERY DAY  16 g  5  . hydrocortisone cream 1 % Apply topically 2 (two) times daily.  30 g  2  . nortriptyline (PAMELOR) 50 MG capsule Take 75 mg by mouth at bedtime. Prescribed by [facility].      . polyethylene glycol powder (GLYCOLAX/MIRALAX) powder Take 17 g by mouth daily.  527 g  prn  . traZODone (DESYREL) 50 MG tablet Take 50 mg by mouth at bedtime. Prescribed by [facility].       . triamterene-hydrochlorothiazide (MAXZIDE-25) 37.5-25 MG per tablet Take 1 each (1 tablet total) by mouth daily.  31 tablet  6  . hydrocortisone butyrate (LUCOID) 0.1 % CREA cream APPLY TO AFFECTED AREA TWICE DAILY  15 g  3  . DISCONTD: NEXIUM 40 MG capsule TAKE 1 CAPSULE BY MOUTH EVERY DAY BEFORE BREAKFAST  30 capsule  PRN   Family History  Problem Relation Age of Onset  . Coronary artery disease Mother     deceased at 87  . Cancer Father     deceased at 50, lung cancer   History   Social History  . Marital Status: Legally Separated    Spouse Name: N/A    Number of Children: N/A  . Years of Education: N/A   Social History Main Topics  . Smoking status: Former Games developer  . Smokeless tobacco: None  . Alcohol Use: No  . Drug Use: No  . Sexually Active: None   Other Topics Concern  . None   Social History Narrative   Divorced, no children.Wheelchair bound from chronic back pain 2/2 workplace injury in her 71s. She used to be a Psychologist, occupational.Lives alone in a house.Current smokerNo alcohol or drug use.Menopause in 2001Last sexual activity in 2001   Review of Systems:  General: no fevers, chills, no changes in body  weight, no changes in appetite Skin: no rash HEENT: no blurry vision, hearing changes or sore throat Pulm: no dyspnea, coughing, wheezing CV: no chest pain, palpitations, shortness of breath Abd: no nausea/vomiting, abdominal pain, diarrhea/constipation GU: no dysuria, hematuria, polyuria Ext: no arthralgias, myalgias Musculoskeletal: Muscle spasm in flank areas  Neuro: no weakness, numbness, or tingling   Objective:  Physical Exam: Filed Vitals:   11/06/11 0837  BP: 124/80  Pulse: 88  Temp: 98.6 F (37 C)  TempSrc: Oral  Height: 5\' 5"  (1.651 m)  Weight: 175 lb (79.379 kg)  SpO2: 98%    General: resting in bed, not in acute distress HEENT: PERRL, EOMI, no scleral icterus. Left ear has ear Wax. Cardiac: S1/S2, RRR, No murmurs, gallops or rubs Pulm: Good air movement bilaterally, Clear to auscultation bilaterally, No rales, wheezing, rhonchi or rubs. Abd: Soft,  nondistended, nontender, no rebound pain, no organomegaly, BS present Ext: No rashes or edema, 2+DP/PT pulse bilaterally Musculoskeletal: No joint deformities, erythema, or stiffness, ROM full and no nontender Skin: no rashes. No skin bruise. Neuro: alert and oriented X3, cranial nerves II-XII grossly intact, muscle strength 5/5 in all extremeties,  sensation to light touch intact.  Psych.: patient is not psychotic, no suicidal or hemocidal ideation.   Assessment & Plan:

## 2011-11-06 NOTE — Patient Instructions (Signed)
1. Please keep your room warm. I will check your blood to make sure your electrolytes are normal.  2. Please take all medications as prescribed.  3. If you have worsening of your symptoms or new symptoms arise, please call the clinic ([phone]), or go to the ER immediately if symptoms are severe.

## 2011-11-06 NOTE — Assessment & Plan Note (Signed)
Patient's previous LDL was 111 and HDL was 35 on 05/23/10. She has 3 risk factors (Age, hypertension and low HDL). Her calculated 10 year risk is 2%. Her goal for LDL is less than 130. She is at goal,  Will continue current regimen.

## 2011-11-06 NOTE — Assessment & Plan Note (Signed)
The etiology for patient's muscle spasm and lower leg numbness is not clear now. Patient physical examination showed no focal neurologic signs. There's no weakness, numbness or tingling sensations in her extremities. It is unlikely to be serious neurologic problem, such as a stroke. There is a possibility, though less likely Lipitor-induced muscle injury. Patient is currently taking Lipitor 40 mg daily. We'll check total CK to rule out any possibility of muscle damage. Another possibility is low calcium. We'll check CMP.

## 2011-11-06 NOTE — Assessment & Plan Note (Signed)
Blood pressure is well controlled. Today blood pressure is 120/80. We'll continue current regimen. We'll check CMP.

## 2011-11-07 ENCOUNTER — Telehealth: Payer: Self-pay | Admitting: Internal Medicine

## 2011-11-07 NOTE — Telephone Encounter (Signed)
I called patient and told her that her CMP and Ck were normal. Patient asked whether she can take ASA from now, I side yes. She is happy about these results.

## 2011-11-07 NOTE — Telephone Encounter (Signed)
Dr Clyde Lundborg talked with Miranda Gaines

## 2011-11-30 ENCOUNTER — Other Ambulatory Visit: Payer: Self-pay | Admitting: *Deleted

## 2011-11-30 MED ORDER — FLUTICASONE PROPIONATE 50 MCG/ACT NA SUSP: 2.0000 | Freq: Every day | NASAL | Status: DC

## 2011-12-08 ENCOUNTER — Other Ambulatory Visit: Payer: Self-pay | Admitting: *Deleted

## 2011-12-08 DIAGNOSIS — L219 Seborrheic dermatitis, unspecified: Secondary | ICD-10-CM

## 2011-12-11 MED ORDER — HYDROCORTISONE 1 % EX CREA: TOPICAL_CREAM | Freq: Two times a day (BID) | CUTANEOUS | Status: DC

## 2011-12-28 ENCOUNTER — Other Ambulatory Visit: Payer: Self-pay | Admitting: *Deleted

## 2011-12-28 DIAGNOSIS — I10 Essential (primary) hypertension: Secondary | ICD-10-CM

## 2011-12-28 DIAGNOSIS — E785 Hyperlipidemia, unspecified: Secondary | ICD-10-CM

## 2011-12-28 MED ORDER — ATORVASTATIN CALCIUM 40 MG PO TABS: 40.0000 mg | ORAL_TABLET | Freq: Every day | ORAL | Status: DC

## 2011-12-28 MED ORDER — TRIAMTERENE-HCTZ 37.5-25 MG PO TABS: 1.0000 | ORAL_TABLET | Freq: Every day | ORAL | Status: DC

## 2012-02-06 ENCOUNTER — Ambulatory Visit (INDEPENDENT_AMBULATORY_CARE_PROVIDER_SITE_OTHER): Payer: Medicare Other | Admitting: *Deleted

## 2012-02-06 DIAGNOSIS — Z23 Encounter for immunization: Secondary | ICD-10-CM

## 2012-03-26 ENCOUNTER — Other Ambulatory Visit: Payer: Self-pay | Admitting: *Deleted

## 2012-03-26 DIAGNOSIS — L219 Seborrheic dermatitis, unspecified: Secondary | ICD-10-CM

## 2012-03-26 MED ORDER — HYDROCORTISONE 1 % EX CREA: TOPICAL_CREAM | Freq: Two times a day (BID) | CUTANEOUS | Status: DC

## 2012-04-19 ENCOUNTER — Other Ambulatory Visit: Payer: Self-pay | Admitting: *Deleted

## 2012-04-19 DIAGNOSIS — J329 Chronic sinusitis, unspecified: Secondary | ICD-10-CM

## 2012-04-19 NOTE — Telephone Encounter (Signed)
Will route to patient's PCP. ?

## 2012-04-19 NOTE — Telephone Encounter (Signed)
Fax from [facility] - stated pt has been on Hydrocortisone Butyrate 0.1% but recently received a rx for Hydrocortisone 1% which is OTC. They request clarification - 0.1% or changed to 1%. Thanks

## 2012-04-20 ENCOUNTER — Other Ambulatory Visit: Payer: Self-pay | Admitting: Internal Medicine

## 2012-04-20 DIAGNOSIS — L219 Seborrheic dermatitis, unspecified: Secondary | ICD-10-CM

## 2012-04-20 MED ORDER — HYDROCORTISONE 1 % EX CREA: TOPICAL_CREAM | Freq: Two times a day (BID) | CUTANEOUS | Status: DC

## 2012-04-20 MED ORDER — FLUTICASONE PROPIONATE 50 MCG/ACT NA SUSP: 2.0000 | Freq: Every day | NASAL | Status: DC

## 2012-04-20 NOTE — Telephone Encounter (Signed)
Since hydrocortisone butyrate belongs to low to mid potent steroid category. It is not appropraiat to use it on face chronically due to the potetnial skin atrophy side effects. She should be on 1% of hydrocortison cream.   Lorretta Harp, MD PGY2, Internal Medicine Teaching Service Pager: [phone]

## 2012-04-22 NOTE — Telephone Encounter (Signed)
[facility] made of awared pt needs 1% Hydrocortisone cream per Dr Clyde Lundborg.

## 2012-04-25 ENCOUNTER — Telehealth: Payer: Self-pay | Admitting: *Deleted

## 2012-04-25 NOTE — Telephone Encounter (Signed)
Pharmacy states pt is requesting  hydrocort but 0.1% cream instead of hydrocort hcl 1%. Needs new Rx. [facility]. Stanton Kidney Markese Bloxham RN 04/25/12 2:10PM

## 2012-04-26 ENCOUNTER — Other Ambulatory Visit: Payer: Self-pay | Admitting: Internal Medicine

## 2012-04-26 DIAGNOSIS — L219 Seborrheic dermatitis, unspecified: Secondary | ICD-10-CM

## 2012-04-26 MED ORDER — HYDROCORTISONE BUTYRATE 0.1 % EX CREA: 1.0000 "application " | TOPICAL_CREAM | Freq: Every day | CUTANEOUS | Status: DC

## 2012-04-26 NOTE — Telephone Encounter (Signed)
I sent a new prescription to her pharmacy already.

## 2012-05-17 ENCOUNTER — Other Ambulatory Visit: Payer: Self-pay | Admitting: *Deleted

## 2012-05-17 DIAGNOSIS — K219 Gastro-esophageal reflux disease without esophagitis: Secondary | ICD-10-CM

## 2012-05-17 DIAGNOSIS — I10 Essential (primary) hypertension: Secondary | ICD-10-CM

## 2012-05-17 MED ORDER — TRIAMTERENE-HCTZ 37.5-25 MG PO TABS: 1.0000 | ORAL_TABLET | Freq: Every day | ORAL | Status: DC

## 2012-05-17 MED ORDER — ESOMEPRAZOLE MAGNESIUM 40 MG PO PACK: 40.0000 mg | PACK | Freq: Every day | ORAL | Status: DC

## 2012-05-28 ENCOUNTER — Ambulatory Visit (INDEPENDENT_AMBULATORY_CARE_PROVIDER_SITE_OTHER): Payer: Medicare Other | Admitting: Internal Medicine

## 2012-05-28 ENCOUNTER — Encounter: Payer: Self-pay | Admitting: Internal Medicine

## 2012-05-28 VITALS — BP 120/80 | HR 74 | Temp 97.0°F | Ht 65.0 in | Wt 175.0 lb

## 2012-05-28 DIAGNOSIS — L219 Seborrheic dermatitis, unspecified: Secondary | ICD-10-CM

## 2012-05-28 DIAGNOSIS — Z0000 Encounter for general adult medical examination without abnormal findings: Secondary | ICD-10-CM | POA: Insufficient documentation

## 2012-05-28 DIAGNOSIS — E785 Hyperlipidemia, unspecified: Secondary | ICD-10-CM

## 2012-05-28 DIAGNOSIS — I10 Essential (primary) hypertension: Secondary | ICD-10-CM

## 2012-05-28 DIAGNOSIS — J329 Chronic sinusitis, unspecified: Secondary | ICD-10-CM

## 2012-05-28 MED ORDER — HYDROCORTISONE BUTYRATE 0.1 % EX CREA: 1.0000 "application " | TOPICAL_CREAM | Freq: Every day | CUTANEOUS | Status: DC

## 2012-05-28 MED ORDER — SALINE NASAL SPRAY 0.65 % NA SOLN: 1.0000 | NASAL | Status: DC | PRN

## 2012-05-28 NOTE — Assessment & Plan Note (Addendum)
--  Previous mammogram and her shots are up-to-date - She refused a colonoscopy. Patient was given ocult blood cards in her previous visit, but she did not return it to the lab. She forgot what happened to her cards. She would like to find out what happened. - She refused Pap smear and tetanus vaccination, will postpone.

## 2012-05-28 NOTE — Assessment & Plan Note (Signed)
Patient's LDL was 111 on 05/23/10. She is currently taking Lipitor 40 mg daily. She did not noticed any side effects, such as muscle pain. Her AST and ALT were normal on 11/06/11. We'll continue current regimen.

## 2012-05-28 NOTE — Progress Notes (Signed)
Patient ID: Miranda Gaines, female   DOB: [birth year], 57 y.o.   MRN: [id number] Subjective:   Patient ID: Miranda Gaines female   DOB: 08-24-1955 57 y.o.   MRN: [id number]  CC:  Follow up visit for HTN, HLD and chronic sinusitis  HPI:  [name] is a 57 y.o. lady with past medical history as outlined below, who presents for a followup visit today.  Patient reports that she has been taking all her medications regularly. She generally feels good, except that her chronic sinusitis still bothers her.  1.) HTN: She is currently taking Maxzide. Her blood pressure is normal today. She does not have chest pain, shortness of breath, palpitation or leg edema  2.) HLD: She is currently taking Lipitor 40 mg daily. He did not notice and side effects, such as muscle pain. Her LDL was 111 on 05/23/10.  Her AST and ALT were normal on 11/06/11.  3.) Chronic sinusitis: Patient has chronic sinusitis. She is currently taking Flonase nasal spray. She reports that she still has mild nasal discharge which is green in color. Sometimes she has mild nose bleeding in this winter season, which is a small amount. She does not have fever or chills.   Denies fever, chills, fatigue, cough, chest pain, SOB,  abdominal pain,diarrhea, constipation, dysuria, urgency, frequency, hematuria.   Past Medical History  Diagnosis Date  . Hyperlipidemia   . Hypertension   . GERD (gastroesophageal reflux disease)   . Depression     f/b Dr. Cardell Peach at [facility]  . Degenerative disc disease, cervical     L spine 10/09 showing DJD and cervical spurring but no specific foraminal narrowing. Has been followed by Rochele Pages, pain management physician in [place].  . Pyloric stenosis     s/p EGD with balloon dilatation on 06/23/2007 by Dr. Elnoria Howard  . Tobacco abuse     Did not tolerate Chantix (vomiting)  . Wears hearing aid     In r. ear, followed by ENT. 2/2 recurrent otitis  . Seborrheic dermatitis     of face, sees  Dr. Merlyn Albert Luptom, dermatop cream TID  . Herniated disc     L5-S1. s/p laminectomy in 1993 by Dr. Shelle Iron.  MRI June 2005 showing L4/5 disc bulge + mild facet arthropathy. F/b Dr. Marzetta Board for pain management. Wheel chair bound.   Current Outpatient Prescriptions  Medication Sig Dispense Refill  . atorvastatin (LIPITOR) 40 MG tablet Take 1 tablet (40 mg total) by mouth daily.  31 tablet  5  . cyclobenzaprine (FLEXERIL) 10 MG tablet Take 10 mg by mouth 3 (three) times daily as needed.        Marland Kitchen esomeprazole (NEXIUM) 40 MG packet Take 40 mg by mouth daily before breakfast.  30 each  3  . fluticasone (FLONASE) 50 MCG/ACT nasal spray Place 2 sprays into the nose daily.  16 g  2  . hydrocortisone butyrate (LUCOID) 0.1 % CREA cream Apply 1 application topically daily.  15 g  3  . nortriptyline (PAMELOR) 50 MG capsule Take 75 mg by mouth at bedtime. Prescribed by [facility].      . polyethylene glycol powder (GLYCOLAX/MIRALAX) powder Take 17 g by mouth daily.  527 g  prn  . traZODone (DESYREL) 50 MG tablet Take 50 mg by mouth at bedtime. Prescribed by [facility].       . triamterene-hydrochlorothiazide (MAXZIDE-25) 37.5-25 MG per tablet Take 1 each (1 tablet total) by mouth daily.  31 tablet  5  . Carbamide Peroxide (EAR WAX DROPS) 6.5 % SOLN Please use it only in you left ear  15 mL  0  . sodium chloride (OCEAN NASAL SPRAY) 0.65 % nasal spray Place 1 spray into the nose as needed for congestion.  104 mL  3   Family History  Problem Relation Age of Onset  . Coronary artery disease Mother     deceased at 90  . Cancer Father     deceased at 41, lung cancer   History   Social History  . Marital Status: Legally Separated    Spouse Name: N/A    Number of Children: N/A  . Years of Education: N/A   Social History Main Topics  . Smoking status: Former Games developer  . Smokeless tobacco: None  . Alcohol Use: No  . Drug Use: No  . Sexually Active: None   Other Topics Concern  . None   Social  History Narrative   Divorced, no children.Wheelchair bound from chronic back pain 2/2 workplace injury in her 75s. She used to be a Psychologist, occupational.Lives alone in a house.Current smokerNo alcohol or drug use.Menopause in 2001Last sexual activity in 2001    Review of Systems: as per HPI  Objective:  Physical Exam: Filed Vitals:   05/28/12 1359 05/28/12 1423  BP: 140/101 120/80  Pulse: 74 74  Temp: 97 F (36.1 C)   Height: 5\' 5"  (1.651 m)   Weight: 175 lb (79.379 kg)   SpO2: 98%    Constitutional: Vital signs reviewed.  Patient is a well-developed and well-nourished, in no acute distress and cooperative with exam.   HEENT:  Head: Normocephalic and atraumatic. There is mild tenderness over fontal sinus.  Ear: TM normal bilaterally Mouth: no erythema or exudates, MMM Eyes: PERRL, EOMI, conjunctivae normal, No scleral icterus.  Neck: Supple, Trachea midline normal ROM, No JVD, mass, thyromegaly, or carotid bruit present. No lymph node enlargement. Cardiovascular: RRR, S1 normal, S2 normal, no MRG, pulses symmetric and intact bilaterally Pulmonary/Chest: CTAB, no wheezes, rales, or rhonchi Abdominal: Soft. Non-tender, non-distended, bowel sounds are normal, no masses, organomegaly, or guarding present.  GU: no CVA tenderness Musculoskeletal: No joint deformities, erythema, or stiffness, ROM full and non-tender Hematology: no cervical, inginal, or axillary adenopathy.  Neurological: A&O x3, Strength is normal and symmetric bilaterally, cranial nerve II-XII are grossly intact, no focal motor deficit, sensory intact to light touch bilaterally.  Skin: Warm, dry and intact.  Psychiatric: Normal mood and affect. speech and behavior is normal. Judgment and thought content normal. Cognition and memory are normal.   Assessment & Plan:

## 2012-05-28 NOTE — Patient Instructions (Addendum)
1. Please start using saline spray for your sinusitis.  2. Please take all medications as prescribed.  3. If you have worsening of your symptoms or new symptoms arise, please call the clinic ([phone]), or go to the ER immediately if symptoms are severe.  You have done great job in taking all your medications. I appreciate it very much. Please continue doing that.  Sinusitis Sinusitis is redness, soreness, and swelling (inflammation) of the paranasal sinuses. Paranasal sinuses are air pockets within the bones of your face (beneath the eyes, the middle of the forehead, or above the eyes). In healthy paranasal sinuses, mucus is able to drain out, and air is able to circulate through them by way of your nose. However, when your paranasal sinuses are inflamed, mucus and air can become trapped. This can allow bacteria and other germs to grow and cause infection. Sinusitis can develop quickly and last only a short time (acute) or continue over a long period (chronic). Sinusitis that lasts for more than 12 weeks is considered chronic.  CAUSES  Causes of sinusitis include:  Allergies.  Structural abnormalities, such as displacement of the cartilage that separates your nostrils (deviated septum), which can decrease the air flow through your nose and sinuses and affect sinus drainage.  Functional abnormalities, such as when the small hairs (cilia) that line your sinuses and help remove mucus do not work properly or are not present. SYMPTOMS  Symptoms of acute and chronic sinusitis are the same. The primary symptoms are pain and pressure around the affected sinuses. Other symptoms include:  Upper toothache.  Earache.  Headache.  Bad breath.  Decreased sense of smell and taste.  A cough, which worsens when you are lying flat.  Fatigue.  Fever.  Thick drainage from your nose, which often is green and may contain pus (purulent).  Swelling and warmth over the affected sinuses. DIAGNOSIS    Your caregiver will perform a physical exam. During the exam, your caregiver may:  Look in your nose for signs of abnormal growths in your nostrils (nasal polyps).  Tap over the affected sinus to check for signs of infection.  View the inside of your sinuses (endoscopy) with a special imaging device with a light attached (endoscope), which is inserted into your sinuses. If your caregiver suspects that you have chronic sinusitis, one or more of the following tests may be recommended:  Allergy tests.  Nasal culture A sample of mucus is taken from your nose and sent to a lab and screened for bacteria.  Nasal cytology A sample of mucus is taken from your nose and examined by your caregiver to determine if your sinusitis is related to an allergy. TREATMENT  Most cases of acute sinusitis are related to a viral infection and will resolve on their own within 10 days. Sometimes medicines are prescribed to help relieve symptoms (pain medicine, decongestants, nasal steroid sprays, or saline sprays).  However, for sinusitis related to a bacterial infection, your caregiver will prescribe antibiotic medicines. These are medicines that will help kill the bacteria causing the infection.  Rarely, sinusitis is caused by a fungal infection. In theses cases, your caregiver will prescribe antifungal medicine. For some cases of chronic sinusitis, surgery is needed. Generally, these are cases in which sinusitis recurs more than 3 times per year, despite other treatments. HOME CARE INSTRUCTIONS   Drink plenty of water. Water helps thin the mucus so your sinuses can drain more easily.  Use a humidifier.  Inhale steam 3 to  4 times a day (for example, sit in the bathroom with the shower running).  Apply a warm, moist washcloth to your face 3 to 4 times a day, or as directed by your caregiver.  Use saline nasal sprays to help moisten and clean your sinuses.  Take over-the-counter or prescription medicines for  pain, discomfort, or fever only as directed by your caregiver. SEEK IMMEDIATE MEDICAL CARE IF:  You have increasing pain or severe headaches.  You have nausea, vomiting, or drowsiness.  You have swelling around your face.  You have vision problems.  You have a stiff neck.  You have difficulty breathing. MAKE SURE YOU:   Understand these instructions.  Will watch your condition.  Will get help right away if you are not doing well or get worse. Document Released: 04/10/2005 Document Revised: 07/03/2011 Document Reviewed: 04/25/2011 [facility] Patient Information 2013 [place], [place].

## 2012-05-28 NOTE — Assessment & Plan Note (Signed)
BP Readings from Last 3 Encounters:  05/28/12 120/80  11/06/11 124/80  06/01/11 117/78    Lab Results  Component Value Date   NA 140 11/06/2011   K 3.8 11/06/2011   CREATININE 1.00 11/06/2011    Assessment:  Blood pressure control: controlled  Progress toward BP goal:  at goal  Comments:  Plan:  Medications:  continue current medications  Educational resources provided: brochure  Self management tools provided:    Other plans: Pressure is normal today.  Will continue current regimen.

## 2012-05-28 NOTE — Assessment & Plan Note (Signed)
This is chronic issue. There is no signs of acute exacerbation.  Patient does not have worsening discharge, or purulent nasal discharge, indicating no signs of  bacterial infection. Will continue nasal Flonase and add saline spray to her regimen.

## 2012-06-04 ENCOUNTER — Encounter: Payer: Medicare Other | Admitting: Internal Medicine

## 2012-07-12 ENCOUNTER — Other Ambulatory Visit: Payer: Self-pay | Admitting: *Deleted

## 2012-07-12 DIAGNOSIS — E785 Hyperlipidemia, unspecified: Secondary | ICD-10-CM

## 2012-07-12 MED ORDER — ATORVASTATIN CALCIUM 40 MG PO TABS: 40.0000 mg | ORAL_TABLET | Freq: Every day | ORAL | Status: DC

## 2012-07-17 ENCOUNTER — Other Ambulatory Visit (HOSPITAL_COMMUNITY): Payer: Self-pay | Admitting: Family Medicine

## 2012-07-17 DIAGNOSIS — Z1231 Encounter for screening mammogram for malignant neoplasm of breast: Secondary | ICD-10-CM

## 2012-07-30 ENCOUNTER — Ambulatory Visit (INDEPENDENT_AMBULATORY_CARE_PROVIDER_SITE_OTHER): Payer: Medicare Other | Admitting: Internal Medicine

## 2012-07-30 ENCOUNTER — Encounter: Payer: Self-pay | Admitting: Internal Medicine

## 2012-07-30 VITALS — BP 130/84 | HR 74 | Temp 97.0°F | Ht 65.0 in | Wt 180.3 lb

## 2012-07-30 DIAGNOSIS — Z0000 Encounter for general adult medical examination without abnormal findings: Secondary | ICD-10-CM

## 2012-07-30 DIAGNOSIS — I10 Essential (primary) hypertension: Secondary | ICD-10-CM

## 2012-07-30 DIAGNOSIS — E785 Hyperlipidemia, unspecified: Secondary | ICD-10-CM

## 2012-07-30 NOTE — Assessment & Plan Note (Signed)
LDL was 111 at 05/23/10. Her goal of LDL is less than 130. Patient is currently taking Lipitor 40 mg daily. She did not have noticed any side effects, such as muscle pain. Patient had occasional muscle spasm. Her CK was normal at 7/13.

## 2012-07-30 NOTE — Progress Notes (Signed)
Patient ID: Miranda Gaines, female   DOB: 07-25-1955, 57 y.o.   MRN: [id number] Subjective:   Patient ID: Miranda Gaines female   DOB: [birth year] 57 y.o.   MRN: [id number]  CC: Follow up visit for HTN, HLD and healthy maintenance.     HPI:  [name] is a 57 y.o. lady with past medical history as outlined below, who presents for a followup visit today.  Patient reports that she has been taking all her medications regularly. She generally feels good except previously described "muscle spasm" when environment is cold. She had normal K, calcium and CK in 10/2011.    1.) HTN: She is currently taking Maxzide. Her blood pressure is normal today. She does not have chest pain, shortness of breath, palpitation or leg edema  2.) HLD: She is currently taking Lipitor 40 mg daily. He did not notice and side effects, such as muscle pain. Her LDL was 111 on 05/23/10.  Her AST and ALT were normal on 11/06/11.  3.) Chronic sinusitis: Patient has chronic sinusitis. She is taking Flonase nasal spray. In her last visit, she was advice to add saline spray. Today her symptoms improved significantly.   Denies fever, chills, fatigue, cough, chest pain, SOB,  abdominal pain,diarrhea, constipation, dysuria, urgency, frequency, hematuria.     Past Medical History  Diagnosis Date  . Hyperlipidemia   . Hypertension   . GERD (gastroesophageal reflux disease)   . Depression     f/b Dr. Cardell Peach at [facility]  . Degenerative disc disease, cervical     L spine 10/09 showing DJD and cervical spurring but no specific foraminal narrowing. Has been followed by Rochele Pages, pain management physician in [place].  . Pyloric stenosis     s/p EGD with balloon dilatation on 06/23/2007 by Dr. Elnoria Howard  . Tobacco abuse     Did not tolerate Chantix (vomiting)  . Wears hearing aid     In r. ear, followed by ENT. 2/2 recurrent otitis  . Seborrheic dermatitis     of face, sees Dr. Merlyn Albert Luptom, dermatop cream TID  .  Herniated disc     L5-S1. s/p laminectomy in 1993 by Dr. Shelle Iron.  MRI June 2005 showing L4/5 disc bulge + mild facet arthropathy. F/b Dr. Marzetta Board for pain management. Wheel chair bound.   Current Outpatient Prescriptions  Medication Sig Dispense Refill  . atorvastatin (LIPITOR) 40 MG tablet Take 1 tablet (40 mg total) by mouth daily.  31 tablet  5  . Carbamide Peroxide (EAR WAX DROPS) 6.5 % SOLN Please use it only in you left ear  15 mL  0  . cyclobenzaprine (FLEXERIL) 10 MG tablet Take 10 mg by mouth 3 (three) times daily as needed.        Marland Kitchen esomeprazole (NEXIUM) 40 MG packet Take 40 mg by mouth daily before breakfast.  30 each  3  . fluticasone (FLONASE) 50 MCG/ACT nasal spray Place 2 sprays into the nose daily.  16 g  2  . hydrocortisone butyrate (LUCOID) 0.1 % CREA cream Apply 1 application topically daily.  15 g  3  . nortriptyline (PAMELOR) 50 MG capsule Take 75 mg by mouth at bedtime. Prescribed by [facility].      . polyethylene glycol powder (GLYCOLAX/MIRALAX) powder Take 17 g by mouth daily.  527 g  prn  . sodium chloride (OCEAN NASAL SPRAY) 0.65 % nasal spray Place 1 spray into the nose as needed for congestion.  104  mL  3  . traZODone (DESYREL) 50 MG tablet Take 50 mg by mouth at bedtime. Prescribed by [facility].       . triamterene-hydrochlorothiazide (MAXZIDE-25) 37.5-25 MG per tablet Take 1 each (1 tablet total) by mouth daily.  31 tablet  5   No current facility-administered medications for this visit.   Family History  Problem Relation Age of Onset  . Coronary artery disease Mother     deceased at 63  . Cancer Father     deceased at 24, lung cancer   History   Social History  . Marital Status: Legally Separated    Spouse Name: N/A    Number of Children: N/A  . Years of Education: N/A   Social History Main Topics  . Smoking status: Former Games developer  . Smokeless tobacco: Former Neurosurgeon     Comment: quit at 2012  . Alcohol Use: No  . Drug Use: No  . Sexually  Active: None   Other Topics Concern  . None   Social History Narrative   Divorced, no children.   Wheelchair bound from chronic back pain 2/2 workplace injury in her 19s. She used to be a Psychologist, occupational.   Lives alone in a house.   Current smoker   No alcohol or drug use.   Menopause in 2001   Last sexual activity in 2001    Review of Systems: as per HPI  Objective:  Physical Exam: Filed Vitals:   07/30/12 1529  BP: 130/84  Pulse: 74  Temp: 97 F (36.1 C)  TempSrc: Oral  Height: 5\' 5"  (1.651 m)  Weight: 180 lb 4.8 oz (81.784 kg)  SpO2: 98%   Constitutional: Vital signs reviewed.  Patient is a well-developed and well-nourished, in no acute distress and cooperative with exam.   HEENT:  Head: Normocephalic and atraumatic Ear: TM normal bilaterally Mouth: no erythema or exudates, MMM Eyes: PERRL, EOMI, conjunctivae normal, No scleral icterus.  Neck: Supple, Trachea midline normal ROM, No JVD, mass, thyromegaly, or carotid bruit present. No lymph node enlargement. Cardiovascular: RRR, S1 normal, S2 normal, no MRG, pulses symmetric and intact bilaterally Pulmonary/Chest: CTAB, no wheezes, rales, or rhonchi Abdominal: Soft. Non-tender, non-distended, bowel sounds are normal, no masses, organomegaly, or guarding present.  GU: no CVA tenderness Musculoskeletal: No joint deformities, erythema, or stiffness, ROM full and non-tender Hematology: no cervical, inginal, or axillary adenopathy.  Neurological: A&O x3, Strength is normal and symmetric bilaterally, cranial nerve II-XII are grossly intact, no focal motor deficit, sensory intact to light touch bilaterally.  Skin: Warm, dry and intact. No rash, cyanosis, or clubbing.  Psychiatric: Normal mood and affect. speech and behavior is normal. Judgment and thought content normal. Cognition and memory are normal.   Assessment & Plan:

## 2012-07-30 NOTE — Assessment & Plan Note (Signed)
Symptoms improved significantly. We'll continue current regimen.

## 2012-07-30 NOTE — Assessment & Plan Note (Signed)
BP Readings from Last 3 Encounters:  07/30/12 130/84  05/28/12 120/80  11/06/11 124/80    Lab Results  Component Value Date   NA 140 11/06/2011   K 3.8 11/06/2011   CREATININE 1.00 11/06/2011    Assessment: Blood pressure control: controlled Progress toward BP goal:  at goal Comments:  Plan: Medications:  continue current medications Educational resources provided: brochure Self management tools provided:   Other plans: It is well controlled. We'll continue current regimen.

## 2012-07-30 NOTE — Patient Instructions (Signed)
1. You have done great job in taking all your medications. I appreciate it very much. Please continue doing that. 2. Please take all medications as prescribed.  3. If you have worsening of your symptoms or new symptoms arise, please call the clinic ([phone]), or go to the ER immediately if symptoms are severe.     

## 2012-07-30 NOTE — Assessment & Plan Note (Signed)
-  Patient has an appointment for mammogram for at 08/12/12. -Will give patient referral to GI for colonoscopy.

## 2012-07-31 NOTE — Progress Notes (Signed)
I have discussed this case with Dr. Niu , read the documentation and I agree with the plan of care. Please see the resident note for details of management.  

## 2012-08-05 ENCOUNTER — Other Ambulatory Visit: Payer: Self-pay | Admitting: *Deleted

## 2012-08-05 DIAGNOSIS — K219 Gastro-esophageal reflux disease without esophagitis: Secondary | ICD-10-CM

## 2012-08-05 MED ORDER — ESOMEPRAZOLE MAGNESIUM 40 MG PO PACK: 40.0000 mg | PACK | Freq: Every day | ORAL | Status: DC

## 2012-08-05 NOTE — Telephone Encounter (Signed)
Pharmacy called - pt never used packets always cap. Will subsitute to caps. Stanton Kidney Nica Friske RN 08/05/12 2:40PM

## 2012-08-12 ENCOUNTER — Ambulatory Visit (HOSPITAL_COMMUNITY)
Admission: RE | Admit: 2012-08-12 | Discharge: 2012-08-12 | Disposition: A | Discharge status: Home / Self Care | Payer: Medicare Other | Source: Ambulatory Visit | Attending: Family Medicine | Admitting: Family Medicine

## 2012-08-12 DIAGNOSIS — Z1231 Encounter for screening mammogram for malignant neoplasm of breast: Secondary | ICD-10-CM

## 2012-09-27 ENCOUNTER — Other Ambulatory Visit: Payer: Self-pay | Admitting: *Deleted

## 2012-09-27 DIAGNOSIS — L219 Seborrheic dermatitis, unspecified: Secondary | ICD-10-CM

## 2012-09-28 MED ORDER — HYDROCORTISONE BUTYRATE 0.1 % EX CREA: 1.0000 "application " | TOPICAL_CREAM | Freq: Every day | CUTANEOUS | Status: DC

## 2012-10-08 ENCOUNTER — Other Ambulatory Visit: Payer: Self-pay | Admitting: *Deleted

## 2012-10-08 DIAGNOSIS — K5900 Constipation, unspecified: Secondary | ICD-10-CM

## 2012-10-09 MED ORDER — POLYETHYLENE GLYCOL 3350 17 GM/SCOOP PO POWD: 17.0000 g | Freq: Every day | ORAL | Status: DC

## 2012-11-26 ENCOUNTER — Ambulatory Visit (INDEPENDENT_AMBULATORY_CARE_PROVIDER_SITE_OTHER): Payer: Medicare Other | Admitting: Internal Medicine

## 2012-11-26 ENCOUNTER — Encounter: Payer: Self-pay | Admitting: Internal Medicine

## 2012-11-26 VITALS — BP 140/94 | HR 79 | Temp 98.2°F | Ht 65.0 in | Wt 176.0 lb

## 2012-11-26 DIAGNOSIS — Z135 Encounter for screening for eye and ear disorders: Secondary | ICD-10-CM

## 2012-11-26 DIAGNOSIS — M62838 Other muscle spasm: Secondary | ICD-10-CM

## 2012-11-26 DIAGNOSIS — I10 Essential (primary) hypertension: Secondary | ICD-10-CM

## 2012-11-26 DIAGNOSIS — E785 Hyperlipidemia, unspecified: Secondary | ICD-10-CM

## 2012-11-26 DIAGNOSIS — Z0000 Encounter for general adult medical examination without abnormal findings: Secondary | ICD-10-CM

## 2012-11-26 MED ORDER — AMLODIPINE BESYLATE 10 MG PO TABS: 10.0000 mg | ORAL_TABLET | Freq: Every day | ORAL | Status: DC

## 2012-11-26 MED ORDER — METHOCARBAMOL 500 MG PO TABS: 500.0000 mg | ORAL_TABLET | Freq: Three times a day (TID) | ORAL | Status: DC

## 2012-11-26 NOTE — Progress Notes (Signed)
Case discussed with Dr. Niu soon after the resident saw the patient.  We reviewed the resident's history and exam and pertinent patient test results.  I agree with the assessment, diagnosis, and plan of care documented in the resident's note. 

## 2012-11-26 NOTE — Progress Notes (Signed)
Patient ID: Miranda Gaines, female   DOB: [birth year], 57 y.o.   MRN: [id number] Subjective:   Patient ID: Miranda Gaines female   DOB: [birth year] 57 y.o.   MRN: [id number]  CC:   Follow up visit.    HPI:  Miranda Gaines is a 57 y.o. lay with past medical history as outlined below, who presents for a followup visit today     1.) HTN: She is currently taking Maxzide. Her blood pressure is 140/93 today. She does not have chest pain, shortness of breath, palpitation or leg edema  2.) HLD: She is currently taking Lipitor 40 mg daily. He did not notice and side effects, such as muscle pain. Her LDL was 111 on 05/23/10.  Her AST and ALT were normal on 11/06/11.  3. Muscle spasm:  She reports that she still has muscle spasm when environment is cold. In her previous visit, we checked her electrolytes which showed normal K, calcium and CK in 10/2011.  She reports that her muscle spasms seems to be related to her blood pressure medication, Maxzide. She has been taking Flexeril without any help.  ROS:  Denies fever, chills, fatigue, headaches,  cough, chest pain, SOB,  abdominal pain,diarrhea, constipation, dysuria, urgency, frequency, hematuria.     Past Medical History  Diagnosis Date  . Hyperlipidemia   . Hypertension   . GERD (gastroesophageal reflux disease)   . Depression     f/b Dr. Cardell Peach at [facility]  . Degenerative disc disease, cervical     L spine 10/09 showing DJD and cervical spurring but no specific foraminal narrowing. Has been followed by Rochele Pages, pain management physician in [place].  . Pyloric stenosis     s/p EGD with balloon dilatation on 06/23/2007 by Dr. Elnoria Howard  . Tobacco abuse     Did not tolerate Chantix (vomiting)  . Wears hearing aid     In r. ear, followed by ENT. 2/2 recurrent otitis  . Seborrheic dermatitis     of face, sees Dr. Merlyn Albert Luptom, dermatop cream TID  . Herniated disc     L5-S1. s/p laminectomy in 1993 by Dr. Shelle Iron.  MRI June 2005  showing L4/5 disc bulge + mild facet arthropathy. F/b Dr. Marzetta Board for pain management. Wheel chair bound.   Current Outpatient Prescriptions  Medication Sig Dispense Refill  . amLODipine (NORVASC) 10 MG tablet Take 1 tablet (10 mg total) by mouth daily.  30 tablet  3  . atorvastatin (LIPITOR) 40 MG tablet Take 1 tablet (40 mg total) by mouth daily.  31 tablet  5  . Carbamide Peroxide (EAR WAX DROPS) 6.5 % SOLN Please use it only in you left ear  15 mL  0  . esomeprazole (NEXIUM) 40 MG packet Take 40 mg by mouth daily before breakfast.  30 each  5  . fluticasone (FLONASE) 50 MCG/ACT nasal spray Place 2 sprays into the nose daily.  16 g  2  . hydrocortisone butyrate (LUCOID) 0.1 % CREA cream Apply 1 application topically daily.  15 g  3  . methocarbamol (ROBAXIN) 500 MG tablet Take 1 tablet (500 mg total) by mouth 3 (three) times daily.  90 tablet  3  . nortriptyline (PAMELOR) 50 MG capsule Take 75 mg by mouth at bedtime. Prescribed by [facility].      . polyethylene glycol powder (GLYCOLAX/MIRALAX) powder Take 17 g by mouth daily.  527 g  prn  . sodium chloride (OCEAN NASAL SPRAY) 0.65 %  nasal spray Place 1 spray into the nose as needed for congestion.  104 mL  3  . traZODone (DESYREL) 50 MG tablet Take 50 mg by mouth at bedtime. Prescribed by Mental [facility].       . triamterene-hydrochlorothiazide (MAXZIDE-25) 37.5-25 MG per tablet Take 1 each (1 tablet total) by mouth daily.  31 tablet  5   No current facility-administered medications for this visit.   Family History  Problem Relation Age of Onset  . Coronary artery disease Mother     deceased at 49  . Cancer Father     deceased at 31, lung cancer   History   Social History  . Marital Status: Legally Separated    Spouse Name: N/A    Number of Children: N/A  . Years of Education: N/A   Social History Main Topics  . Smoking status: Former Games developer  . Smokeless tobacco: Former Neurosurgeon     Comment: quit at 2012  . Alcohol Use: No   . Drug Use: No  . Sexually Active: None   Other Topics Concern  . None   Social History Narrative   Divorced, no children.   Wheelchair bound from chronic back pain 2/2 workplace injury in her 32s. She used to be a Psychologist, occupational.   Lives alone in a house.   Current smoker   No alcohol or drug use.   Menopause in 2001   Last sexual activity in 2001    Review of Systems: Full 14-point review of systems otherwise negative. See HPI.   Objective:  Physical Exam: Filed Vitals:   11/26/12 1336  BP: 140/94  Pulse: 79  Temp: 98.2 F (36.8 C)  TempSrc: Oral  Height: 5\' 5"  (1.651 m)  Weight: 176 lb (79.833 kg)  SpO2: 98%   Constitutional: Vital signs reviewed.  Patient is a well-developed and well-nourished, in no acute distress and cooperative with exam.   HEENT:  Head: Normocephalic and atraumatic Ear: TM normal bilaterally Mouth: no erythema or exudates, MMM Eyes: PERRL, EOMI, conjunctivae normal, No scleral icterus.  Neck: Supple, Trachea midline normal ROM, No JVD, mass, thyromegaly, or carotid bruit present. No lymph node enlargement. Cardiovascular: RRR, S1 normal, S2 normal, no MRG, pulses symmetric and intact bilaterally Pulmonary/Chest: CTAB, no wheezes, rales, or rhonchi Abdominal: Soft. Non-tender, non-distended, bowel sounds are normal, no masses, organomegaly, or guarding present.  GU: no CVA tenderness Musculoskeletal: Spinal and paraspinal muscle tender over lower lumbar levels.  Neurological: She is alert and oriented to person, place, and time. Has decreased left lower extremity strength at 3/5. Sensation intact to light touch throughout which is chronic since her back surgery in 1994 Psychiatric: She has a normal mood and affect.    Assessment & Plan:

## 2012-11-26 NOTE — Assessment & Plan Note (Addendum)
Patient's LDL was 111 on 05/23/10. Her liver function was normal on 11/06/11. She is currently taking Lipitor 40 mg daily. We'll continue current regimen and recheck her lipid level in next visit because she could not wait any longer in clinic.

## 2012-11-26 NOTE — Assessment & Plan Note (Signed)
The etiology is not very clear. She had a negative workup including normal potassium, calcium and CK. Patient reports that her spasm seems to be related to her blood pressure medication, Maxzide. Patient up-to-date, he seems that hydrochlorothiazide can cause these side effects.  - will switch her Maxzide to Amlodipin 10 mg dialy from now. - will follow up in one month. - Will switch Flexeril to Robaxin from now.

## 2012-11-26 NOTE — Patient Instructions (Signed)
1. Please stop taking Maxzide from now. Please start taking amlodipine for blood pressure.  2. Please stop taking flexeril and start taking robaxin for muscle spasm from now. 3. If you have worsening of your symptoms or new symptoms arise, please call the clinic ([phone]), or go to the ER immediately if symptoms are severe.  You have done great job in taking all your medications. I appreciate it very much. Please continue doing that.

## 2012-11-26 NOTE — Assessment & Plan Note (Signed)
-  patient refused Pap smear, will postpone.

## 2012-11-28 ENCOUNTER — Other Ambulatory Visit: Payer: Self-pay | Admitting: Internal Medicine

## 2012-11-29 ENCOUNTER — Other Ambulatory Visit: Payer: Self-pay | Admitting: Internal Medicine

## 2012-11-29 NOTE — Telephone Encounter (Signed)
This medication (Maxzide) was discontinued due to possible side effect of muscle spasm.  Lorretta Harp, MD PGY3, Internal Medicine Teaching Service Pager: [phone]

## 2012-12-24 ENCOUNTER — Ambulatory Visit (INDEPENDENT_AMBULATORY_CARE_PROVIDER_SITE_OTHER): Payer: Medicare Other | Admitting: Internal Medicine

## 2012-12-24 ENCOUNTER — Encounter: Payer: Self-pay | Admitting: Internal Medicine

## 2012-12-24 VITALS — BP 119/76 | HR 88 | Temp 97.6°F | Ht 63.0 in

## 2012-12-24 DIAGNOSIS — Z23 Encounter for immunization: Secondary | ICD-10-CM

## 2012-12-24 DIAGNOSIS — R42 Dizziness and giddiness: Secondary | ICD-10-CM

## 2012-12-24 DIAGNOSIS — E785 Hyperlipidemia, unspecified: Secondary | ICD-10-CM

## 2012-12-24 DIAGNOSIS — Z299 Encounter for prophylactic measures, unspecified: Secondary | ICD-10-CM

## 2012-12-24 DIAGNOSIS — I10 Essential (primary) hypertension: Secondary | ICD-10-CM

## 2012-12-24 DIAGNOSIS — Z0000 Encounter for general adult medical examination without abnormal findings: Secondary | ICD-10-CM

## 2012-12-24 LAB — LIPID PANEL
Cholesterol: 220 mg/dL — ABNORMAL HIGH (ref 0–200)
HDL: 31 mg/dL — ABNORMAL LOW (ref >39)
LDL Cholesterol: 147 mg/dL — ABNORMAL HIGH (ref 0–99)
Total CHOL/HDL Ratio: 7.1 Ratio
Triglycerides: 211 mg/dL — ABNORMAL HIGH (ref <150)
VLDL: 42 mg/dL — ABNORMAL HIGH (ref 0–40)

## 2012-12-24 LAB — COMPLETE METABOLIC PANEL WITH GFR
ALT: 12 U/L (ref 0–35)
AST: 13 U/L (ref 0–37)
Albumin: 4.5 g/dL (ref 3.5–5.2)
Alkaline Phosphatase: 68 U/L (ref 39–117)
BUN: 15 mg/dL (ref 6–23)
CO2: 27 mEq/L (ref 19–32)
Calcium: 10 mg/dL (ref 8.4–10.5)
Chloride: 102 mEq/L (ref 96–112)
Creat: 1.02 mg/dL (ref 0.50–1.10)
GFR, Est African American: 71 mL/min
GFR, Est Non African American: 61 mL/min
Glucose, Bld: 102 mg/dL — ABNORMAL HIGH (ref 70–99)
Potassium: 4 mEq/L (ref 3.5–5.3)
Sodium: 138 mEq/L (ref 135–145)
Total Bilirubin: 0.3 mg/dL (ref 0.3–1.2)
Total Protein: 7.4 g/dL (ref 6.0–8.3)

## 2012-12-24 MED ORDER — AMLODIPINE BESYLATE 10 MG PO TABS: 5.0000 mg | ORAL_TABLET | Freq: Every day | ORAL | Status: DC

## 2012-12-24 MED ORDER — MECLIZINE HCL 12.5 MG PO TABS: 12.5000 mg | ORAL_TABLET | Freq: Three times a day (TID) | ORAL | Status: DC | PRN

## 2012-12-24 NOTE — Assessment & Plan Note (Signed)
It is well controled. Last LDL was 111 on 05/23/10. Will continue current Lipitor 40 mg daily and check lipid profile today.

## 2012-12-24 NOTE — Assessment & Plan Note (Addendum)
Patient's dizziness is most likely caused by vertigo. It is likely due to BPPV. It is less likely due to Mnire's disease given no ear pain or acute hearing loss. Patient symptoms are unlikely due to TIA or stroke given no any new weakness, numbness or decreased sensations in extremities. No new focal neurologic signs (patient has chronic leg weakness secondary to back surgery, which has not changed in nature). Patient does not have orthostatic signs (sitting blood pressure 119/76 with pulse 76; lying blood pressure 120/80 with pulse 80).   -Will treat her symptomatically with meclizine -Patient's blood pressure today's 119/76 after switched from HCTZ to amlodipine. Her bp was usually controlled at normal, but above 120 for SBP. Will decrease her amlodipine dose from 10 mg to 5 mg daily.

## 2012-12-24 NOTE — Progress Notes (Signed)
Patient ID: Miranda Gaines, female   DOB: [birth year], 57 y.o.   MRN: [id number] Subjective:   Patient ID: Miranda Gaines female   DOB: 04-03-1956 57 y.o.   MRN: [id number]  CC:  follow up visit.  HPI:  Miranda Gaines is a 57 y.o. lady with past medical history as outlined below,who presents for a followup visit today.   1.) HTN: HCTZ was switched to amlodipine in previous visit. Currently, she is on amlodipine 10 mg daily. Bp is 119/79 mmHg today. She does not have chest pain, shortness of breath and palpitation.   2.) HLD: She is currently taking Lipitor 40 mg daily. He did not notice and side effects, such as muscle pain. Her LDL was 111 on 05/23/10.  Her AST and ALT were normal on 11/06/11.  3. Muscle spasm and vertigo  Patient was seen in clinic on 11/26/12. She reported having muscle spasm. We checked her electrolytes which showed normal K, calcium and CK. She reports that her muscle spasms seems to be related to her blood pressure medication, Maxzide. In her previous visit, hydrochlorothiazide was switched to amlodipine 10 mg daily. The patient reports that her muscle spasm has completely resolved, but she reports that she started having dizziness which is positional. It is aggravated by turning her head to either sides. She feels like the room is spinning around her. It is worse in the morning. She did not have a recent viral infection. She has chronic hearing loss, which has not changed in nature. She does not have ear pain. She had complicated lower back surgery in the past, which leads to weakness in her lower legs, worse on the left. Her leg weakness has not changed in nature. She does not have weakness or numbness or tingling sensations in her arms.   ROS:  Denies fever, chills, fatigue, headaches,  cough, chest pain, SOB,  abdominal pain,diarrhea, constipation, dysuria, urgency, frequency, hematuria.    Past Medical History  Diagnosis Date  . Hyperlipidemia   . Hypertension   .  GERD (gastroesophageal reflux disease)   . Depression     f/b Dr. Cardell Peach at [facility]  . Degenerative disc disease, cervical     L spine 10/09 showing DJD and cervical spurring but no specific foraminal narrowing. Has been followed by Rochele Pages, pain management physician in [place].  . Pyloric stenosis     s/p EGD with balloon dilatation on 06/23/2007 by Dr. Elnoria Howard  . Tobacco abuse     Did not tolerate Chantix (vomiting)  . Wears hearing aid     In r. ear, followed by ENT. 2/2 recurrent otitis  . Seborrheic dermatitis     of face, sees Dr. Merlyn Albert Luptom, dermatop cream TID  . Herniated disc     L5-S1. s/p laminectomy in 1993 by Dr. Shelle Iron.  MRI June 2005 showing L4/5 disc bulge + mild facet arthropathy. F/b Dr. Marzetta Board for pain management. Wheel chair bound.   Current Outpatient Prescriptions  Medication Sig Dispense Refill  . amLODipine (NORVASC) 10 MG tablet Take 1 tablet (10 mg total) by mouth daily.  30 tablet  3  . atorvastatin (LIPITOR) 40 MG tablet Take 1 tablet (40 mg total) by mouth daily.  31 tablet  5  . Carbamide Peroxide (EAR WAX DROPS) 6.5 % SOLN Please use it only in you left ear  15 mL  0  . esomeprazole (NEXIUM) 40 MG packet Take 40 mg by mouth daily before breakfast.  30 each  5  . fluticasone (FLONASE) 50 MCG/ACT nasal spray Place 2 sprays into the nose daily.  16 g  2  . hydrocortisone butyrate (LUCOID) 0.1 % CREA cream Apply 1 application topically daily.  15 g  3  . methocarbamol (ROBAXIN) 500 MG tablet Take 1 tablet (500 mg total) by mouth 3 (three) times daily.  90 tablet  3  . nortriptyline (PAMELOR) 50 MG capsule Take 75 mg by mouth at bedtime. Prescribed by [facility].      . polyethylene glycol powder (GLYCOLAX/MIRALAX) powder Take 17 g by mouth daily.  527 g  prn  . sodium chloride (OCEAN NASAL SPRAY) 0.65 % nasal spray Place 1 spray into the nose as needed for congestion.  104 mL  3  . traZODone (DESYREL) 50 MG tablet Take 50 mg by mouth at  bedtime. Prescribed by Mental [facility].        No current facility-administered medications for this visit.   Family History  Problem Relation Age of Onset  . Coronary artery disease Mother     deceased at 71  . Cancer Father     deceased at 81, lung cancer   History   Social History  . Marital Status: Legally Separated    Spouse Name: N/A    Number of Children: N/A  . Years of Education: N/A   Social History Main Topics  . Smoking status: Former Games developer  . Smokeless tobacco: Former Neurosurgeon     Comment: quit at 2012  . Alcohol Use: No  . Drug Use: No  . Sexual Activity: None   Other Topics Concern  . None   Social History Narrative   Divorced, no children.   Wheelchair bound from chronic back pain 2/2 workplace injury in her 30s. She used to be a Psychologist, occupational.   Lives alone in a house.   Current smoker   No alcohol or drug use.   Menopause in 2001   Last sexual activity in 2001    Review of Systems: Full 14-point review of systems otherwise negative. See HPI.   Objective:  Physical Exam: Filed Vitals:   12/24/12 1338  BP: 119/76  Pulse: 88  Temp: 97.6 F (36.4 C)  TempSrc: Oral  Height: 5\' 3"  (1.6 m)  SpO2: 98%   Constitutional: Vital signs reviewed.  Patient is a well-developed and well-nourished, in no acute distress and cooperative with exam.  HEENT:  Head: Normocephalic and atraumatic Ear: TM normal bilaterally Mouth: no erythema or exudates, MMM Eyes: PERRL, EOMI, conjunctivae normal, No scleral icterus.  Neck: Supple, Trachea midline normal ROM, No JVD, mass, thyromegaly, or carotid bruit present. No lymph node enlargement. Cardiovascular: RRR, S1 normal, S2 normal, no MRG, pulses symmetric and intact bilaterally Pulmonary/Chest: CTAB, no wheezes, rales, or rhonchi Abdominal: Soft. Non-tender, non-distended, bowel sounds are normal, no masses, organomegaly, or guarding present.   GU: no CVA tenderness Musculoskeletal: Paraspinal muscle tender over lower  lumbar levels.  Neurological: She is alert and oriented to person, place, and time. Has decreased left lower extremity strength at 3/5, which is chronic since her back surgery in 1994. Sensation intact to light touch throughout  Psychiatric: She has a normal mood and affect.    Assessment & Plan:

## 2012-12-24 NOTE — Assessment & Plan Note (Signed)
-  will give flu shot today - will check Lipid panel

## 2012-12-24 NOTE — Patient Instructions (Signed)
1. Please start taking meclizine 12.5 mg three times a day as needed for dizziness. 2. Please decrease your amlodipine dosage from 10 mg to 5 mg daily from now. 3. If you have worsening of your symptoms or new symptoms arise, please call the clinic ([phone]), or go to the ER immediately if symptoms are severe.  Vertigo Vertigo means you feel like you or your surroundings are moving when they are not. Vertigo can be dangerous if it occurs when you are at work, driving, or performing difficult activities.  CAUSES  Vertigo occurs when there is a conflict of signals sent to your brain from the visual and sensory systems in your body. There are many different causes of vertigo, including:  Infections, especially in the inner ear.  A bad reaction to a drug or misuse of alcohol and medicines.  Withdrawal from drugs or alcohol.  Rapidly changing positions, such as lying down or rolling over in bed.  A migraine headache.  Decreased blood flow to the brain.  Increased pressure in the brain from a head injury, infection, tumor, or bleeding. SYMPTOMS  You may feel as though the world is spinning around or you are falling to the ground. Because your balance is upset, vertigo can cause nausea and vomiting. You may have involuntary eye movements (nystagmus). DIAGNOSIS  Vertigo is usually diagnosed by physical exam. If the cause of your vertigo is unknown, your caregiver may perform imaging tests, such as an MRI scan (magnetic resonance imaging). TREATMENT  Most cases of vertigo resolve on their own, without treatment. Depending on the cause, your caregiver may prescribe certain medicines. If your vertigo is related to body position issues, your caregiver may recommend movements or procedures to correct the problem. In rare cases, if your vertigo is caused by certain inner ear problems, you may need surgery. HOME CARE INSTRUCTIONS   Follow your caregiver's instructions.  Avoid driving.  Avoid  operating heavy machinery.  Avoid performing any tasks that would be dangerous to you or others during a vertigo episode.  Tell your caregiver if you notice that certain medicines seem to be causing your vertigo. Some of the medicines used to treat vertigo episodes can actually make them worse in some people. SEEK IMMEDIATE MEDICAL CARE IF:   Your medicines do not relieve your vertigo or are making it worse.  You develop problems with talking, walking, weakness, or using your arms, hands, or legs.  You develop severe headaches.  Your nausea or vomiting continues or gets worse.  You develop visual changes.  A family member notices behavioral changes.  Your condition gets worse. MAKE SURE YOU:  Understand these instructions.  Will watch your condition.  Will get help right away if you are not doing well or get worse. Document Released: 01/18/2005 Document Revised: 07/03/2011 Document Reviewed: 10/27/2010 [facility] Patient Information 2014 [place], [place].

## 2012-12-24 NOTE — Assessment & Plan Note (Signed)
BP Readings from Last 3 Encounters:  12/24/12 119/76  11/26/12 140/94  07/30/12 130/84    Lab Results  Component Value Date   NA 140 11/06/2011   K 3.8 11/06/2011   CREATININE 1.00 11/06/2011    Assessment: Blood pressure control: controlled Progress toward BP goal:  at goal Comments:   Plan: Medications:  will derease her amlodipine dose from 10 mg to 5 mg daily. Educational resources provided: Comptroller tools provided:   Other plans: will decrease her amlodipine dose from 10 mg to 5 mg daily.

## 2012-12-25 NOTE — Progress Notes (Signed)
Case discussed with Dr. Niu at the time of the visit.  We reviewed the resident's history and exam and pertinent patient test results.  I agree with the assessment, diagnosis, and plan of care documented in the resident's note.    

## 2012-12-26 ENCOUNTER — Other Ambulatory Visit: Payer: Self-pay | Admitting: Internal Medicine

## 2012-12-26 DIAGNOSIS — E785 Hyperlipidemia, unspecified: Secondary | ICD-10-CM

## 2012-12-26 DIAGNOSIS — L219 Seborrheic dermatitis, unspecified: Secondary | ICD-10-CM

## 2012-12-26 DIAGNOSIS — K219 Gastro-esophageal reflux disease without esophagitis: Secondary | ICD-10-CM

## 2013-02-14 NOTE — Addendum Note (Signed)
Addended by: Neomia Dear on: 02/14/2013 06:59 AM   Modules accepted: Orders

## 2013-02-25 ENCOUNTER — Other Ambulatory Visit: Payer: Self-pay | Admitting: Internal Medicine

## 2013-02-25 DIAGNOSIS — I10 Essential (primary) hypertension: Secondary | ICD-10-CM

## 2013-03-18 ENCOUNTER — Other Ambulatory Visit: Payer: Self-pay | Admitting: Internal Medicine

## 2013-03-18 DIAGNOSIS — R42 Dizziness and giddiness: Secondary | ICD-10-CM

## 2013-04-15 ENCOUNTER — Other Ambulatory Visit: Payer: Self-pay | Admitting: Internal Medicine

## 2013-04-15 DIAGNOSIS — M549 Dorsalgia, unspecified: Secondary | ICD-10-CM

## 2013-04-22 ENCOUNTER — Other Ambulatory Visit: Payer: Self-pay | Admitting: Internal Medicine

## 2013-05-01 DIAGNOSIS — M47817 Spondylosis without myelopathy or radiculopathy, lumbosacral region: Secondary | ICD-10-CM | POA: Insufficient documentation

## 2013-05-01 DIAGNOSIS — M5416 Radiculopathy, lumbar region: Secondary | ICD-10-CM | POA: Insufficient documentation

## 2013-05-14 ENCOUNTER — Other Ambulatory Visit: Payer: Self-pay | Admitting: Internal Medicine

## 2013-05-14 DIAGNOSIS — R42 Dizziness and giddiness: Secondary | ICD-10-CM

## 2013-06-17 ENCOUNTER — Encounter: Payer: Medicare Other | Admitting: Internal Medicine

## 2013-06-20 ENCOUNTER — Other Ambulatory Visit: Payer: Self-pay | Admitting: Internal Medicine

## 2013-06-20 DIAGNOSIS — E785 Hyperlipidemia, unspecified: Secondary | ICD-10-CM

## 2013-06-20 DIAGNOSIS — K219 Gastro-esophageal reflux disease without esophagitis: Secondary | ICD-10-CM

## 2013-08-13 ENCOUNTER — Encounter: Payer: Self-pay | Admitting: Internal Medicine

## 2013-08-13 ENCOUNTER — Ambulatory Visit (INDEPENDENT_AMBULATORY_CARE_PROVIDER_SITE_OTHER): Payer: Medicare Other | Admitting: Internal Medicine

## 2013-08-13 VITALS — BP 118/79 | HR 90 | Temp 98.1°F | Ht 63.0 in | Wt 187.9 lb

## 2013-08-13 DIAGNOSIS — I10 Essential (primary) hypertension: Secondary | ICD-10-CM

## 2013-08-13 DIAGNOSIS — L739 Follicular disorder, unspecified: Secondary | ICD-10-CM | POA: Insufficient documentation

## 2013-08-13 DIAGNOSIS — L219 Seborrheic dermatitis, unspecified: Secondary | ICD-10-CM | POA: Diagnosis not present

## 2013-08-13 DIAGNOSIS — L678 Other hair color and hair shaft abnormalities: Secondary | ICD-10-CM | POA: Diagnosis not present

## 2013-08-13 DIAGNOSIS — L738 Other specified follicular disorders: Secondary | ICD-10-CM | POA: Diagnosis not present

## 2013-08-13 MED ORDER — KETOCONAZOLE 2 % EX SHAM: 1.0000 "application " | MEDICATED_SHAMPOO | CUTANEOUS | Status: DC

## 2013-08-13 MED ORDER — CLINDAMYCIN PHOSPHATE 1 % EX GEL: Freq: Two times a day (BID) | CUTANEOUS | Status: DC

## 2013-08-13 NOTE — Patient Instructions (Addendum)
General Instructions: Please read information below  Return to clinic if not improved in 1 month Wash your hair up to 2x per week and let shampoo stay on  Use Clindamycin gel to scalp in the back 2 x per day   Treatment Goals:  Goals (1 Years of Data) as of 08/13/13         12/24/12 05/23/10     Result Component    . LDL CALC < 130  147 111      Progress Toward Treatment Goals:  Treatment Goal 08/13/2013  Blood pressure at goal    Self Care Goals & Plans:  Self Care Goal 08/13/2013  Manage my medications take my medicines as prescribed; bring my medications to every visit; refill my medications on time  Monitor my health keep track of my blood pressure  Eat healthy foods drink diet soda or water instead of juice or soda; eat more vegetables; eat foods that are low in salt; eat baked foods instead of fried foods; eat fruit for snacks and desserts; eat smaller portions  Be physically active find an activity I enjoy    No flowsheet data found.   Care Management & Community Referrals:  Referral 08/13/2013  Referrals made for care management support none needed  Referrals made to community resources none       Exercise to Lose Weight Exercise and a healthy diet may help you lose weight. Your doctor may suggest specific exercises. EXERCISE IDEAS AND TIPS  Choose low-cost things you enjoy doing, such as walking, bicycling, or exercising to workout videos.  Take stairs instead of the elevator.  Walk during your lunch break.  Park your car further away from work or school.  Go to a gym or an exercise class.  Start with 5 to 10 minutes of exercise each day. Build up to 30 minutes of exercise 4 to 6 days a week.  Wear shoes with good support and comfortable clothes.  Stretch before and after working out.  Work out until you breathe harder and your heart beats faster.  Drink extra water when you exercise.  Do not do so much that you hurt yourself, feel dizzy, or get  very short of breath. Exercises that burn about 150 calories:  Running 1  miles in 15 minutes.  Playing volleyball for 45 to 60 minutes.  Washing and waxing a car for 45 to 60 minutes.  Playing touch football for 45 minutes.  Walking 1  miles in 35 minutes.  Pushing a stroller 1  miles in 30 minutes.  Playing basketball for 30 minutes.  Raking leaves for 30 minutes.  Bicycling 5 miles in 30 minutes.  Walking 2 miles in 30 minutes.  Dancing for 30 minutes.  Shoveling snow for 15 minutes.  Swimming laps for 20 minutes.  Walking up stairs for 15 minutes.  Bicycling 4 miles in 15 minutes.  Gardening for 30 to 45 minutes.  Jumping rope for 15 minutes.  Washing windows or floors for 45 to 60 minutes. Document Released: 05/13/2010 Document Revised: 07/03/2011 Document Reviewed: 05/13/2010 [facility] Patient Information 2014 [place], [place].  DASH Diet The DASH diet stands for "Dietary Approaches to Stop Hypertension." It is a healthy eating plan that has been shown to reduce high blood pressure (hypertension) in as little as 14 days, while also possibly providing other significant health benefits. These other health benefits include reducing the risk of breast cancer after menopause and reducing the risk of type 2 diabetes, heart disease,  colon cancer, and stroke. Health benefits also include weight loss and slowing kidney failure in patients with chronic kidney disease.  DIET GUIDELINES  Limit salt (sodium). Your diet should contain less than 1500 mg of sodium daily.  Limit refined or processed carbohydrates. Your diet should include mostly whole grains. Desserts and added sugars should be used sparingly.  Include small amounts of heart-healthy fats. These types of fats include nuts, oils, and tub margarine. Limit saturated and trans fats. These fats have been shown to be harmful in the body. CHOOSING FOODS  The following food groups are based on a 2000 calorie diet. See  your Registered Dietitian for individual calorie needs. Grains and Grain Products (6 to 8 servings daily)  Eat More Often: Whole-wheat bread, brown rice, whole-grain or wheat pasta, quinoa, popcorn without added fat or salt (air popped).  Eat Less Often: White bread, white pasta, white rice, cornbread. Vegetables (4 to 5 servings daily)  Eat More Often: Fresh, frozen, and canned vegetables. Vegetables may be raw, steamed, roasted, or grilled with a minimal amount of fat.  Eat Less Often/Avoid: Creamed or fried vegetables. Vegetables in a cheese sauce. Fruit (4 to 5 servings daily)  Eat More Often: All fresh, canned (in natural juice), or frozen fruits. Dried fruits without added sugar. One hundred percent fruit juice ( cup [237 mL] daily).  Eat Less Often: Dried fruits with added sugar. Canned fruit in light or heavy syrup. [employer], Fish, and Poultry (2 servings or less daily. One serving is 3 to 4 oz [85-114 g]).  Eat More Often: Ninety percent or leaner ground beef, tenderloin, sirloin. Round cuts of beef, chicken breast, [place] breast. All fish. Grill, bake, or broil your meat. Nothing should be fried.  Eat Less Often/Avoid: Fatty cuts of meat, [place], or chicken leg, thigh, or wing. Fried cuts of meat or fish. Dairy (2 to 3 servings)  Eat More Often: Low-fat or fat-free milk, low-fat plain or light yogurt, reduced-fat or part-skim cheese.  Eat Less Often/Avoid: Milk (whole, 2%).Whole milk yogurt. Full-fat cheeses. Nuts, Seeds, and Legumes (4 to 5 servings per week)  Eat More Often: All without added salt.  Eat Less Often/Avoid: Salted nuts and seeds, canned beans with added salt. Fats and Sweets (limited)  Eat More Often: Vegetable oils, tub margarines without trans fats, sugar-free gelatin. Mayonnaise and salad dressings.  Eat Less Often/Avoid: Coconut oils, palm oils, butter, stick margarine, cream, half and half, cookies, candy, pie. FOR MORE INFORMATION The Dash Diet  Eating Plan: www.dashdiet.org Document Released: 03/30/2011 Document Revised: 07/03/2011 Document Reviewed: 03/30/2011 [facility] Patient Information 2014 [place], [place].  Hypertension As your heart beats, it forces blood through your arteries. This force is your blood pressure. If the pressure is too high, it is called hypertension (HTN) or high blood pressure. HTN is dangerous because you may have it and not know it. High blood pressure may mean that your heart has to work harder to pump blood. Your arteries may be narrow or stiff. The extra work puts you at risk for heart disease, stroke, and other problems.  Blood pressure consists of two numbers, a higher number over a lower, 110/72, for example. It is stated as "110 over 72." The ideal is below 120 for the top number (systolic) and under 80 for the bottom (diastolic). Write down your blood pressure today. You should pay close attention to your blood pressure if you have certain conditions such as:  Heart failure.  Prior heart attack.  Diabetes  Chronic kidney disease.  Prior stroke.  Multiple risk factors for heart disease. To see if you have HTN, your blood pressure should be measured while you are seated with your arm held at the level of the heart. It should be measured at least twice. A one-time elevated blood pressure reading (especially in the Emergency Department) does not mean that you need treatment. There may be conditions in which the blood pressure is different between your right and left arms. It is important to see your caregiver soon for a recheck. Most people have essential hypertension which means that there is not a specific cause. This type of high blood pressure may be lowered by changing lifestyle factors such as:  Stress.  Smoking.  Lack of exercise.  Excessive weight.  Drug/tobacco/alcohol use.  Eating less salt. Most people do not have symptoms from high blood pressure until it has caused damage to the  body. Effective treatment can often prevent, delay or reduce that damage. TREATMENT  When a cause has been identified, treatment for high blood pressure is directed at the cause. There are a large number of medications to treat HTN. These fall into several categories, and your caregiver will help you select the medicines that are best for you. Medications may have side effects. You should review side effects with your caregiver. If your blood pressure stays high after you have made lifestyle changes or started on medicines,   Your medication(s) may need to be changed.  Other problems may need to be addressed.  Be certain you understand your prescriptions, and know how and when to take your medicine.  Be sure to follow up with your caregiver within the time frame advised (usually within two weeks) to have your blood pressure rechecked and to review your medications.  If you are taking more than one medicine to lower your blood pressure, make sure you know how and at what times they should be taken. Taking two medicines at the same time can result in blood pressure that is too low. SEEK IMMEDIATE MEDICAL CARE IF:  You develop a severe headache, blurred or changing vision, or confusion.  You have unusual weakness or numbness, or a faint feeling.  You have severe chest or abdominal pain, vomiting, or breathing problems. MAKE SURE YOU:   Understand these instructions.  Will watch your condition.  Will get help right away if you are not doing well or get worse. Document Released: 04/10/2005 Document Revised: 07/03/2011 Document Reviewed: 11/29/2007 [facility] Patient Information 2014 [place]. Ketoconazole shampoo What is this medicine? KETOCONAZOLE (kee toe KON na zole) is an antifungal medicine. It is used to treat certain kinds of fungal or yeast infections. This medicine may be used for other purposes; ask your health care provider or pharmacist if you have questions. COMMON  BRAND NAME(S): Nizoral A-D, Nizoral What should I tell my health care provider before I take this medicine? They need to know if you have any of these conditions: -an unusual or allergic reaction to ketoconazole, itraconazole, miconazole, sulfites, other foods, dyes or preservatives -pregnant or trying to get pregnant -breast-feeding How should I use this medicine? This medicine is for external use only. Follow the directions on the prescription label. Wash your hands before and after use. Apply the shampoo to damp skin of the affected area of the skin or scalp. Use enough shampoo to cover the affected area and a wide margin surrounding the affected area. Work the shampoo into a Education officer, museumlather and leave on for  5 minutes. Rinse the treated area completely with water. Do not get the shampoo in your eyes. If you do, rinse out with plenty of cool tap water. Finish the full course prescribed by your doctor or health care professional even if you think your condition is better. Do not stop using except on the advice of your doctor or health care professional. Talk to your pediatrician regarding the use of this medicine in children. Special care may be needed. Overdosage: If you think you have taken too much of this medicine contact a poison control center or emergency room at once. NOTE: This medicine is only for you. Do not share this medicine with others. What if I miss a dose? If you miss a dose, use it as soon as you can. If it is almost time for your next dose, use only that dose. Do not use double or extra doses. What may interact with this medicine? Interactions are not expected. Do not use any other skin products without telling your doctor or health care professional. This list may not describe all possible interactions. Give your health care provider a list of all the medicines, herbs, non-prescription drugs, or dietary supplements you use. Also tell them if you smoke, drink alcohol, or use illegal drugs.  Some items may interact with your medicine. What should I watch for while using this medicine? Tell your doctor or health care professional if your symptoms do not begin to improve in 1 to 2 weeks. If your hair has been permanently waved the shampoo may remove the curl. What side effects may I notice from receiving this medicine? Side effects that you should report to your doctor or health care professional as soon as possible: -allergic reactions like skin rash, itching or hives, swelling of the face, lips, or tongue -pain, tingling, numbness Side effects that usually do not require medical attention (report to your doctor or health care professional if they continue or are bothersome): -dry skin -hair loss, hair discoloration or abnormal texture -skin irritation This list may not describe all possible side effects. Call your doctor for medical advice about side effects. You may report side effects to FDA at 1-800-FDA-1088. Where should I keep my medicine? Keep out of the reach of children. Store at room temperature below 25 degrees C (77 degrees F). Protect from light. Keep container tightly closed. Throw away any unused medicine after the expiration date. NOTE: This sheet is a summary. It may not cover all possible information. If you have questions about this medicine, talk to your doctor, pharmacist, or health care provider.  2014, Elsevier/Gold Standard. (2008-08-07 14:27:49)  Clindamycin skin solution or gel What is this medicine? CLINDAMYCIN (KLIN da MYE sin) is a lincosamide antibiotic. It is used on the skin to stop the growth of certain bacteria that cause acne. This medicine may be used for other purposes; ask your health care provider or pharmacist if you have questions. COMMON BRAND NAME(S): Cleocin T, Clinda-Derm , Clindagel, ClindaMax What should I tell my health care provider before I take this medicine? They need to know if you have any of these  conditions: -diarrhea -inflammatory bowel disease -kidney or liver disease -stomach problems like colitis -an unusual or allergic reaction to clindamycin, lincomycin, other medicines, foods, dyes or preservatives -pregnant or trying to get pregnant -breast-feeding How should I use this medicine? This medicine is for external use only. Wash hands before and after use. Wash affected area and gently pat dry. Apply a thin layer  of this medicine to the affected area as often as prescribed by your doctor or health care professional. Do not use skin products near the eyes, nose, or mouth. If you do get any in your eyes rinse out with plenty of cool tap water. Do not use your medicine more often than directed. Talk to your pediatrician regarding the use of this medicine in children. Special care may be needed. While this medicine may be prescribed for children as young as 12 years for selected conditions, precautions do apply. Overdosage: If you think you have taken too much of this medicine contact a poison control center or emergency room at once. NOTE: This medicine is only for you. Do not share this medicine with others. What if I miss a dose? If you miss a dose, use it as soon as you can. If it is almost time for your next dose, use only that dose. Do not use double or extra doses. What may interact with this medicine? -medicated cosmetics, including coverup preparations -other acne products including benzoyl peroxide, salicylic acid, or tretinoin -skin care products that have a high alcohol content (some shaving creams, lotions, or after shave lotion) -some skin cleansers or medicated soaps This list may not describe all possible interactions. Give your health care provider a list of all the medicines, herbs, non-prescription drugs, or dietary supplements you use. Also tell them if you smoke, drink alcohol, or use illegal drugs. Some items may interact with your medicine. What should I watch for  while using this medicine? Your acne should start to get better within about 6 weeks. Complete improvement may take longer. Tell your doctor or health care professional if you do not see any improvement. Your skin may get very dry and scale or peel. Let your doctor or health care professional know if this happens. Do not use any soothing cream or ointment without advice. What side effects may I notice from receiving this medicine? Side effects that you should report to your doctor or health care professional as soon as possible: -allergic reactions like skin rash, itching or hives, swelling of the face, lips, or tongue -diarrhea that is watery or severe -pain on swallowing -stomach pain or cramps -unusual bleeding or bruising Side effects that usually do not require medical attention (report to your doctor or health care professional if they continue or are bothersome): -dry skin -nausea, vomiting This list may not describe all possible side effects. Call your doctor for medical advice about side effects. You may report side effects to FDA at 1-800-FDA-1088. Where should I keep my medicine? Keep out of the reach of children. Store at room temperature between 20 and 25 degrees C (68 and 77 degrees F). Do not freeze. Throw away any unused medicine after the expiration date. NOTE: This sheet is a summary. It may not cover all possible information. If you have questions about this medicine, talk to your doctor, pharmacist, or health care provider.  2014, Elsevier/Gold Standard. (2007-07-15 16:47:05)  Folliculitis  Folliculitis is redness, soreness, and swelling (inflammation) of the hair follicles. This condition can occur anywhere on the body. People with weakened immune systems, diabetes, or obesity have a greater risk of getting folliculitis. CAUSES  Bacterial infection. This is the most common cause.  Fungal infection.  Viral infection.  Contact with certain chemicals, especially oils  and tars. Long-term folliculitis can result from bacteria that live in the nostrils. The bacteria may trigger multiple outbreaks of folliculitis over time. SYMPTOMS Folliculitis most  commonly occurs on the scalp, thighs, legs, back, buttocks, and areas where hair is shaved frequently. An early sign of folliculitis is a small, white or yellow, pus-filled, itchy lesion (pustule). These lesions appear on a red, inflamed follicle. They are usually less than 0.2 inches (5 mm) wide. When there is an infection of the follicle that goes deeper, it becomes a boil or furuncle. A group of closely packed boils creates a larger lesion (carbuncle). Carbuncles tend to occur in hairy, sweaty areas of the body. DIAGNOSIS  Your caregiver can usually tell what is wrong by doing a physical exam. A sample may be taken from one of the lesions and tested in a lab. This can help determine what is causing your folliculitis. TREATMENT  Treatment may include:  Applying warm compresses to the affected areas.  Taking antibiotic medicines orally or applying them to the skin.  Draining the lesions if they contain a large amount of pus or fluid.  Laser hair removal for cases of long-lasting folliculitis. This helps to prevent regrowth of the hair. HOME CARE INSTRUCTIONS  Apply warm compresses to the affected areas as directed by your caregiver.  If antibiotics are prescribed, take them as directed. Finish them even if you start to feel better.  You may take over-the-counter medicines to relieve itching.  Do not shave irritated skin.  Follow up with your caregiver as directed. SEEK IMMEDIATE MEDICAL CARE IF:   You have increasing redness, swelling, or pain in the affected area.  You have a fever. MAKE SURE YOU:  Understand these instructions.  Will watch your condition.  Will get help right away if you are not doing well or get worse. Document Released: 06/19/2001 Document Revised: 10/10/2011 Document  Reviewed: 07/11/2011 [facility] Patient Information 2014 [place], [place].

## 2013-08-13 NOTE — Progress Notes (Signed)
Attending physician note: I have reviewed the presented complaining, problem list, physical findings, and medication list with the resident physician Riki Sheerraci MacLean and concur with her management. Cephas DarbyJames Granfortuna, MD, FACP  Hematology-Oncology/Internal Medicine

## 2013-08-13 NOTE — Assessment & Plan Note (Signed)
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

## 2013-08-13 NOTE — Assessment & Plan Note (Signed)
BP Readings from Last 3 Encounters:  08/13/13 118/79  12/24/12 119/76  11/26/12 140/94    Lab Results  Component Value Date   NA 138 12/24/2012   K 4.0 12/24/2012   CREATININE 1.02 12/24/2012    Assessment: Blood pressure control: controlled Progress toward BP goal:  at goal Comments: none  Plan: Medications:  continue current medications Educational resources provided: brochure;other (see comments) Self management tools provided: home blood pressure logbook;other (see comments) Other plans:f/u with PCP

## 2013-08-13 NOTE — Assessment & Plan Note (Signed)
Rx Nizoral 2% shampoo today  RTC in 1 month if not improving

## 2013-08-13 NOTE — Progress Notes (Signed)
   Subjective:    Patient ID: Miranda Gaines, female    DOB: [birth year], 58 y.o.   MRN: [id number]  HPI Comments: 58 y.o Past Medical History Hyperlipidemia, Hypertension (BP 118/79), GERD (gastroesophageal reflux disease), Depression,Degenerative disc disease, cervical, Pyloric stenosis s/p EGD with balloon dilation, Tobacco abuse, Wears hearing aid                                 Seborrheic dermatitis. Herniated disc   She presents for: 1) hair/scalp problem with bumps in the back of her head and itching a lot and hair falling out.  She has a history of seborrheic dermatitis to scalp.  She states the fine bumps to her scalp are not going away and have been there 6-7 months at times with pus, itching badly.  She denies flaking to scalp.  She is using [employer] w/o relief.  She denies using chemical processing to scalp.         Review of Systems  Respiratory: Negative for shortness of breath.   Cardiovascular: Negative for chest pain.       Objective:   Physical Exam  Nursing note and vitals reviewed. Constitutional: Vital signs are normal. She is cooperative.  HENT:  Head: Normocephalic and atraumatic.    Mouth/Throat: Oropharynx is clear and moist and mucous membranes are normal. Abnormal dentition.  Eyes: Conjunctivae are normal.  Cardiovascular: Normal rate, regular rhythm, S1 normal and S2 normal.   Pulmonary/Chest: Effort normal and breath sounds normal.  Neurological: She is alert.  In electrical wheelchair   Skin:  See head  Psychiatric: She has a normal mood and affect. Her speech is normal and behavior is normal. Judgment and thought content normal. Cognition and memory are normal.          Assessment & Plan:  1 mo prn

## 2013-09-03 ENCOUNTER — Other Ambulatory Visit: Payer: Self-pay | Admitting: Internal Medicine

## 2013-09-03 DIAGNOSIS — I10 Essential (primary) hypertension: Secondary | ICD-10-CM

## 2013-09-03 DIAGNOSIS — L219 Seborrheic dermatitis, unspecified: Secondary | ICD-10-CM

## 2013-09-09 ENCOUNTER — Other Ambulatory Visit: Payer: Self-pay | Admitting: Internal Medicine

## 2013-09-09 DIAGNOSIS — L732 Hidradenitis suppurativa: Secondary | ICD-10-CM

## 2013-10-03 ENCOUNTER — Other Ambulatory Visit: Payer: Self-pay | Admitting: Internal Medicine

## 2013-10-03 DIAGNOSIS — J329 Chronic sinusitis, unspecified: Secondary | ICD-10-CM

## 2013-10-03 DIAGNOSIS — L219 Seborrheic dermatitis, unspecified: Secondary | ICD-10-CM

## 2013-10-07 ENCOUNTER — Other Ambulatory Visit: Payer: Self-pay | Admitting: Internal Medicine

## 2013-10-08 NOTE — Telephone Encounter (Signed)
Had nl colon 09/08/12. Will refill.

## 2013-10-28 ENCOUNTER — Encounter: Payer: Self-pay | Admitting: Internal Medicine

## 2013-10-28 ENCOUNTER — Ambulatory Visit (INDEPENDENT_AMBULATORY_CARE_PROVIDER_SITE_OTHER): Payer: Medicare Other | Admitting: Internal Medicine

## 2013-10-28 VITALS — BP 140/83 | HR 85 | Temp 97.8°F | Ht 63.0 in | Wt 176.8 lb

## 2013-10-28 DIAGNOSIS — H9209 Otalgia, unspecified ear: Secondary | ICD-10-CM | POA: Diagnosis not present

## 2013-10-28 DIAGNOSIS — J328 Other chronic sinusitis: Secondary | ICD-10-CM

## 2013-10-28 DIAGNOSIS — H9201 Otalgia, right ear: Secondary | ICD-10-CM

## 2013-10-28 DIAGNOSIS — H6120 Impacted cerumen, unspecified ear: Secondary | ICD-10-CM | POA: Diagnosis present

## 2013-10-28 LAB — CBC WITH DIFFERENTIAL/PLATELET
Basophils Absolute: 0.1 10*3/uL (ref 0.0–0.1)
Basophils Relative: 1 % (ref 0–1)
Eosinophils Absolute: 0.3 10*3/uL (ref 0.0–0.7)
Eosinophils Relative: 6 % — ABNORMAL HIGH (ref 0–5)
HCT: 38.7 % (ref 36.0–46.0)
Hemoglobin: 12.7 g/dL (ref 12.0–15.0)
Lymphocytes Relative: 54 % — ABNORMAL HIGH (ref 12–46)
Lymphs Abs: 2.8 10*3/uL (ref 0.7–4.0)
MCH: 29 pg (ref 26.0–34.0)
MCHC: 32.8 g/dL (ref 30.0–36.0)
MCV: 88.4 fL (ref 78.0–100.0)
Monocytes Absolute: 0.3 10*3/uL (ref 0.1–1.0)
Monocytes Relative: 6 % (ref 3–12)
Neutro Abs: 1.7 10*3/uL (ref 1.7–7.7)
Neutrophils Relative %: 33 % — ABNORMAL LOW (ref 43–77)
Platelets: 256 10*3/uL (ref 150–400)
RBC: 4.38 MIL/uL (ref 3.87–5.11)
RDW: 14.2 % (ref 11.5–15.5)
WBC: 5.1 10*3/uL (ref 4.0–10.5)

## 2013-10-28 NOTE — Progress Notes (Signed)
   Subjective:    Patient ID: Miranda Gaines, female    DOB: [birth year], [age] y.o.   MRN: [id number]  HPI Comments: Miranda Gaines is a 58 year old woman with a PMH of HTN, GERD, DDD, chronic sinusitis and hard-of-hearing (wears B/L hearing aids) who presents with c/o of sinusitis.  Frontal headache and pain at top of head, feels as if her brain is "frozen," ears feel clogged up, nasal congestion, postnasal drip, occasional dry cough x four months.  Right ear and TMJ are painful, no pain on left.  No fever, no rhinorrhea, no drainage from the ears, no sinus pressure, no vision loss, no jaw pain or claudication, no weakness.  She has been using Flonase without relief.  Has not tried anything else.  She feels better when she turns the air off, cold air exacerbates the discomfort.  Maxillofacial CT negative in 2013.  Has seen ENT "a while ago" and says she was given ear drops.  She says ENT told her to hold her nose and blow when she felt ears were clogged up.  She has been doing this but it sometimes makes her dizzy.       Review of Systems  Constitutional: Negative for fever, chills and appetite change.  HENT: Positive for congestion, hearing loss and postnasal drip. Negative for ear discharge, ear pain, facial swelling, rhinorrhea, sinus pressure, sneezing and sore throat.        Chronic hearing loss, she wears B/L hearing aids  Eyes: Positive for discharge. Negative for pain and visual disturbance.       Watery eyes  Respiratory: Positive for cough. Negative for shortness of breath.   Cardiovascular: Negative for chest pain.  Gastrointestinal: Positive for constipation. Negative for nausea, vomiting, abdominal pain, diarrhea and blood in stool.       Chronic, relieved with MOM and prune juice  Genitourinary: Negative for dysuria and frequency.  Neurological: Positive for dizziness and headaches. Negative for syncope and weakness.       Objective:   Physical Exam  Constitutional: She is oriented  to person, place, and time. She appears well-developed. No distress.  HENT:  Head: Normocephalic and atraumatic.  Mouth/Throat: Oropharynx is clear and moist. No oropharyngeal exudate.  Temples nontender Ears - small hole present in B/L TMs, no bulging TMs, no otorrhea Nose - slightly erythematous turbinates, no rhinorrhea Sinuses are not tender to palpation  Eyes: EOM are normal. Pupils are equal, round, and reactive to light. Right eye exhibits no discharge. Left eye exhibits no discharge.  Cardiovascular: Normal rate and normal heart sounds.  Exam reveals no gallop and no friction rub.   No murmur heard. Pulmonary/Chest: Effort normal and breath sounds normal. No respiratory distress. She has no wheezes. She has no rales.  Abdominal: Soft. Bowel sounds are normal. She exhibits no distension. There is no tenderness.  Musculoskeletal: Normal range of motion. She exhibits no edema.  Lymphadenopathy:    She has no cervical adenopathy.  Neurological: She is alert and oriented to person, place, and time. No cranial nerve deficit.  Skin: Skin is warm. She is not diaphoretic.  Psychiatric: She has a normal mood and affect. Her behavior is normal.          Assessment & Plan:  Please see problem based assessment and plan.

## 2013-10-28 NOTE — Patient Instructions (Signed)
1. Stop your ear wax drops.  Start ibuprofen for headache/TMJ pain.  We will call you with your appointment time for the Ear, Nose and Throat doctor.   2. Please take all medications as prescribed.     3. If you have worsening of your symptoms or new symptoms arise, please call the clinic [phone] or go to the ER immediately if symptoms are severe.  Please return to clinic in 2-4 weeks for follow-up. Temporomandibular Problems  Temporomandibular joint (TMJ) dysfunction means there are problems with the joint between your jaw and your skull. This is a joint lined by cartilage like other joints in your body but also has a small disc in the joint which keeps the bones from rubbing on each other. These joints are like other joints and can get inflamed (sore) from arthritis and other problems. When this joint gets sore, it can cause headaches and pain in the jaw and the face. CAUSES  Usually the arthritic types of problems are caused by soreness in the joint. Soreness in the joint can also be caused by overuse. This may come from grinding your teeth. It may also come from mis-alignment in the joint. DIAGNOSIS Diagnosis of this condition can often be made by history and exam. Sometimes your caregiver may need X-rays or an MRI scan to determine the exact cause. It may be necessary to see your dentist to determine if your teeth and jaws are lined up correctly. TREATMENT  Most of the time this problem is not serious; however, sometimes it can persist (become chronic). When this happens medications that will cut down on inflammation (soreness) help. Sometimes a shot of cortisone into the joint will be helpful. If your teeth are not aligned it may help for your dentist to make a splint for your mouth that can help this problem. If no physical problems can be found, the problem may come from tension. If tension is found to be the cause, biofeedback or relaxation techniques may be helpful. HOME CARE  INSTRUCTIONS   Later in the day, applications of ice packs may be helpful. Ice can be used in a plastic bag with a towel around it to prevent frostbite to skin. This may be used about every 2 hours for 20 to 30 minutes, as needed while awake, or as directed by your caregiver.  Only take over-the-counter or prescription medicines for pain, discomfort, or fever as directed by your caregiver.  If physical therapy was prescribed, follow your caregiver's directions.  Wear mouth appliances as directed if they were given. Document Released: 01/03/2001 Document Revised: 07/03/2011 Document Reviewed: 04/12/2008 [facility] Patient Information 2015 [employer], [place]. This information is not intended to replace advice given to you by your health care provider. Make sure you discuss any questions you have with your health care provider.

## 2013-10-29 DIAGNOSIS — H9209 Otalgia, unspecified ear: Secondary | ICD-10-CM | POA: Insufficient documentation

## 2013-10-29 NOTE — Assessment & Plan Note (Addendum)
Patient with initial c/o headache but upon further questioning pain seems to be right sided involving her right ear.  Also with pain at top of head worsened by cold exposure.  When asked to open and close jaw she has pain at TMJ.  No signs of infection on ear exam, no bulging TM, no otorrhea, no fever.  No sinus tenderness making acute sinusitis unlikely though she does have allergy symptoms.  No temporal artery tenderness, vision loss or jaw claudication to suggest GCA.  I do think there is an element of eustachian tube dysfunction (ear pressure, clogged feeling, pain) due to allergic sinusitis.  She may also have TMJ dysfunction (pain at TMJ, ear pain, headache) contributing to ear pain. She does appear to have small perforation of each TM.  It is unclear if this is what led to her initial hearing loss 7 years ago or if this is new.    - refer to ENT (she has seen Dr. 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

## 2013-10-30 NOTE — Progress Notes (Signed)
I saw and evaluated the patient.  I personally confirmed the key portions of the history and exam documented by Dr. Wilson and I reviewed pertinent patient test results.  The assessment, diagnosis, and plan were formulated together and I agree with the documentation in the resident's note. 

## 2013-11-21 ENCOUNTER — Other Ambulatory Visit: Payer: Self-pay | Admitting: *Deleted

## 2013-11-21 DIAGNOSIS — E785 Hyperlipidemia, unspecified: Secondary | ICD-10-CM

## 2013-11-21 MED ORDER — ATORVASTATIN CALCIUM 80 MG PO TABS: 80.0000 mg | ORAL_TABLET | Freq: Every day | ORAL | Status: DC

## 2013-11-21 NOTE — Telephone Encounter (Signed)
INSURANCE IS REQUESTING A NEW SCRIPT FOR 90 DAY SUPPLIES

## 2013-11-24 NOTE — Telephone Encounter (Signed)
The insurance is requesting 90 day supply, please respond with 90 day approval

## 2013-11-24 NOTE — Telephone Encounter (Signed)
90 days is fine.  No refills until reviewed by Dr. Beckie Saltsivet - sees tomorrow.

## 2013-11-25 ENCOUNTER — Ambulatory Visit: Payer: Medicare Other | Admitting: Internal Medicine

## 2013-11-25 NOTE — Telephone Encounter (Signed)
Pt missed appt, no show

## 2013-12-03 ENCOUNTER — Other Ambulatory Visit: Payer: Self-pay | Admitting: Internal Medicine

## 2014-01-13 ENCOUNTER — Encounter: Payer: Self-pay | Admitting: Internal Medicine

## 2014-01-13 ENCOUNTER — Ambulatory Visit (INDEPENDENT_AMBULATORY_CARE_PROVIDER_SITE_OTHER): Payer: Medicare Other | Admitting: Internal Medicine

## 2014-01-13 VITALS — BP 140/78 | HR 80 | Temp 98.3°F | Ht 63.0 in | Wt 176.2 lb

## 2014-01-13 DIAGNOSIS — H6120 Impacted cerumen, unspecified ear: Secondary | ICD-10-CM

## 2014-01-13 DIAGNOSIS — H9201 Otalgia, right ear: Secondary | ICD-10-CM

## 2014-01-13 DIAGNOSIS — Z23 Encounter for immunization: Secondary | ICD-10-CM

## 2014-01-13 DIAGNOSIS — H9209 Otalgia, unspecified ear: Secondary | ICD-10-CM

## 2014-01-13 DIAGNOSIS — H6123 Impacted cerumen, bilateral: Secondary | ICD-10-CM

## 2014-01-13 NOTE — Assessment & Plan Note (Addendum)
She may have otitis externa with the itching and mild redness of the right ear canal. Per ENT, she does not have perforation of the TMs. In addition, she may also have seborrheid dermatitis in her ear canal which may cause itching.  She likely also has eustachian tube dysfunction due to allergies.  -Rx ofloxacin otic solution, 10 drops daily for 7 days.  -Rx Claritin  daily

## 2014-01-13 NOTE — Patient Instructions (Signed)
-  Start using ofloxacin ear drops, 10 drops in each ear once per day for 7 days. You may use a cotton ball to prevent the drops from leaking out.  -Start taking Claritin  daily for your allergies and for your ears as well.  -Follow up in 2 weeks for recheck of your ears.   Please bring your medicines with you each time you come.   Medicines may be  Eye drops  Herbal   Vitamins  Pills  Seeing these help [place] take care of you.

## 2014-01-13 NOTE — Progress Notes (Signed)
   Subjective:    Patient ID: Miranda Gaines, female    DOB: [birth year], 58 y.o.   MRN: [id number]  HPI Miranda Gaines is a 58 year old woman with a PMH of HTN, GERD, DDD, chronic sinusitis and hard-of-hearing (wears B/L hearing aids) who presents for evaluation of ear pain, itching, and fullness that has been going for at least one week. She states that she saw Dr. 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 Wax removal was not performed during that visit and she was given Ibuprofen  q6 hr with no follow up. She states that the Ibuprofen has not helped her and the itching has become so severe that it hinders her sleep at night.  She does have seasonal allergies for which she uses a nasal spray but it does not help her ears.  She denies fever/chills. She continues to use her hearing aids but explains that the itching gets worse when she has to wear them. She has not been using Qtips or solutions to clean her ears.   Review of Systems  Constitutional: Negative for fever, chills, diaphoresis, activity change, appetite change, fatigue and unexpected weight change.  HENT: Positive for ear pain, hearing loss, rhinorrhea and sneezing. Negative for ear discharge and facial swelling.        Ear itching, fullness   Eyes: Positive for itching.  Respiratory: Negative for cough, shortness of breath and wheezing.   Cardiovascular: Negative for chest pain and leg swelling.  Gastrointestinal: Negative for abdominal pain.  Neurological: Negative for dizziness and light-headedness.  Psychiatric/Behavioral: Negative for agitation.       Objective:   Physical Exam  Nursing note and vitals reviewed. Constitutional: She is oriented to person, place, and time. She appears well-developed and well-nourished. No distress.  Sitting in wheelchair  HENT:  Head: Normocephalic.  Nose: Nose normal.  Mouth/Throat: Oropharynx is  clear and moist.  Wearing hearing aids bilaterally which were removed for ear exam.  Right ear with partial cerumen impaction, TM is not visualized. Ear canal with mild redness.  Left ear with cerumen impaction, no ear canal redness.   Eyes: Conjunctivae are normal. No scleral icterus.  Cardiovascular: Normal rate.   Pulmonary/Chest: Effort normal. No respiratory distress.  Neurological: She is alert and oriented to person, place, and time.  Skin: Skin is warm and dry. She is not diaphoretic.  Psychiatric: She has a normal mood and affect.          Assessment & Plan:

## 2014-01-14 MED ORDER — OFLOXACIN 0.3 % OT SOLN: 10.0000 [drp] | Freq: Every day | OTIC | Status: DC

## 2014-01-14 MED ORDER — LORATADINE 10 MG PO TABS: 10.0000 mg | ORAL_TABLET | Freq: Every day | ORAL | Status: DC

## 2014-01-14 NOTE — Assessment & Plan Note (Signed)
She received the flu vaccine during this visit.  

## 2014-01-14 NOTE — Assessment & Plan Note (Signed)
She has bilateral ear irrigation with improvement of the cerumen impaction.

## 2014-01-15 ENCOUNTER — Telehealth: Payer: Self-pay | Admitting: *Deleted

## 2014-01-15 ENCOUNTER — Other Ambulatory Visit: Payer: Self-pay | Admitting: Internal Medicine

## 2014-01-15 DIAGNOSIS — H9201 Otalgia, right ear: Secondary | ICD-10-CM

## 2014-01-15 MED ORDER — CIPROFLOXACIN-HYDROCORTISONE 0.2-1 % OT SUSP: 3.0000 [drp] | Freq: Two times a day (BID) | OTIC | Status: DC

## 2014-01-15 NOTE — Progress Notes (Signed)
[facility] called and reports that they do not have ofloxacin otic solution but have ciprofloxacin otic solution.  Rx ciprofloxacin 0.2-1% otic solution 3 drops in right ear twice daily for 7 days x1 refill.

## 2014-01-15 NOTE — Progress Notes (Signed)
Case discussed with Dr. Kennerly soon after the resident saw the patient.  We reviewed the resident's history and exam and pertinent patient test results.  I agree with the assessment, diagnosis and plan of care documented in the resident's note. 

## 2014-01-15 NOTE — Telephone Encounter (Signed)
Pharmacy does not carry Ofloxacin 0.3% otic solution.  They can order Cipro 0.2% otic solution,  If you want that please send in electronically.

## 2014-01-16 ENCOUNTER — Other Ambulatory Visit: Payer: Self-pay | Admitting: Internal Medicine

## 2014-01-16 DIAGNOSIS — H60393 Other infective otitis externa, bilateral: Secondary | ICD-10-CM

## 2014-01-16 MED ORDER — CIPROFLOXACIN-DEXAMETHASONE 0.3-0.1 % OT SUSP: 4.0000 [drp] | Freq: Two times a day (BID) | OTIC | Status: DC

## 2014-01-22 NOTE — Telephone Encounter (Signed)
New Rx sent in

## 2014-01-27 ENCOUNTER — Encounter: Payer: Self-pay | Admitting: Internal Medicine

## 2014-01-27 ENCOUNTER — Ambulatory Visit (INDEPENDENT_AMBULATORY_CARE_PROVIDER_SITE_OTHER): Payer: Medicare Other | Admitting: Internal Medicine

## 2014-01-27 VITALS — BP 145/96 | HR 96 | Temp 98.0°F | Ht 63.0 in | Wt 177.0 lb

## 2014-01-27 DIAGNOSIS — H6123 Impacted cerumen, bilateral: Secondary | ICD-10-CM

## 2014-01-27 NOTE — Progress Notes (Signed)
Patient ID: Miranda Gaines, female   DOB: 1955/06/11, 58 y.o.   MRN: [id number]  Subjective:   Patient ID: Miranda CoveShirley M Pleitez female   DOB: 1955/06/11 58 y.o.   MRN: [id number]  HPI: Ms.Nikcole Armida SansM Ciriello is a 58 y.o. F w/ PMH chronic back pain HLD, HTN, who presents for a f/u after having her ears irrigated and started on abx therapy 11/20/13.  She states that her ears are feeling much better since taking the abx and having her ears irrigated on 01/13/14. She denies pain in her ears. She has bilateral hearing aides and has not noticed any changes in her hearing.   Pt denies fevers, chills, worsening of her hearing, chest pain, SOB, abd pain, N/V, or peripheral edema.   Past Medical History  Diagnosis Date  . Hyperlipidemia   . Hypertension   . GERD (gastroesophageal reflux disease)   . Depression     [facility]  . Degenerative disc disease, cervical     L spine 10/09 showing DJD and cervical spurring but no specific foraminal narrowing. Has been followed by Rochele PagesJanie Kelly, pain management physician in [place].  . Pyloric stenosis     s/p EGD with balloon dilatation on 06/23/2007 by Dr. Elnoria HowardHung  . Tobacco abuse     Did not tolerate Chantix (vomiting)  . Wears hearing aid     In r. ear, followed by ENT. 2/2 recurrent otitis  . Seborrheic dermatitis     of face, sees Dr. Merlyn AlbertFred Luptom, dermatop cream TID  . Herniated disc     L5-S1. s/p laminectomy in 1993 by Dr. Shelle IronBeane.  MRI June 2005 showing L4/5 disc bulge + mild facet arthropathy. F/b Dr. Marzetta BoardJane Keilitz for pain management. Wheel chair bound.   Current Outpatient Prescriptions  Medication Sig Dispense Refill  . amLODipine (NORVASC) 10 MG tablet TAKE 1 TABLET BY MOUTH EVERY DAY  30 tablet  2  . atorvastatin (LIPITOR) 80 MG tablet Take 1 tablet (80 mg total) by mouth daily at 6 PM.  30 tablet  4  . ciprofloxacin-dexamethasone (CIPRODEX) otic suspension Place 4 drops into both ears 2 (two) times daily. For 7 days.  7.5 mL   0  . ciprofloxacin-hydrocortisone (CIPRO HC) otic suspension Place 3 drops into both ears 2 (two) times daily. For 7 days  10 mL  1  . clindamycin (CLINDAGEL) 1 % gel APPLY TOPICALLY TWICE DAILY  60 g  3  . fluticasone (FLONASE) 50 MCG/ACT nasal spray INHALE 2 SPRAYS IN EACH NOSTRIL EVERY DAY  16 g  2  . hydrocortisone butyrate (LUCOID) 0.1 % CREA cream APPLY 1 APPLICATION TOPICALLY DAILY  45 g  1  . ketoconazole (NIZORAL) 2 % shampoo APPLY TOPICALLY TWICE DAILY  120 mL  3  . loratadine (CLARITIN) 10 MG tablet Take 1 tablet (10 mg total) by mouth daily.  30 tablet  2  . meclizine (ANTIVERT) 12.5 MG tablet TAKE 1 TABLET BY MOUTH THREE TIMES A DAY AS NEEDED FOR NAUSEA OR DIZZINESS  30 tablet  1  . methocarbamol (ROBAXIN) 500 MG tablet TAKE 1 TABLET BY MOUTH THREE TIMES A DAY  90 tablet  5  . NEXIUM 40 MG capsule TAKE 1 CAPSULE BY MOUTH DAILY BEFORE BREAKFAST  30 capsule  4  . NEXIUM 40 MG packet TAKE 40 MG BY MOUTH EVERY DAY BEFORE BREAKFAST  30 each  3  . nortriptyline (PAMELOR) 50 MG capsule Take 75 mg by mouth at  bedtime. Prescribed by Mental Health.      . polyethylene glycol powder (GLYCOLAX/MIRALAX) powder MIX 1 CAPFUL (17GM) IN A GLASS OF LIQUID AND DRINK EVERY DAY  527 g  3  . sodium chloride (OCEAN NASAL SPRAY) 0.65 % nasal spray Place 1 spray into the nose as needed for congestion.  104 mL  3  . traZODone (DESYREL) 50 MG tablet Take 50 mg by mouth at bedtime. Prescribed by Mental Health.        No current facility-administered medications for this visit.   Family History  Problem Relation Age of Onset  . Coronary artery disease Mother     deceased at 81  . Cancer Father     deceased at 59, lung cancer   History   Social History  . Marital Status: Legally Separated    Spouse Name: N/A    Number of Children: N/A  . Years of Education: N/A   Social History Main Topics  . Smoking status: Former Games developer  . Smokeless tobacco: Former Neurosurgeon     Comment: quit at 2012  . Alcohol Use:  No  . Drug Use: No  . Sexual Activity: None   Other Topics Concern  . None   Social History Narrative   Divorced, no children.   Wheelchair bound from chronic back pain 2/2 workplace injury in her 13s. She used to be a Psychologist, occupational.   Lives alone in a house.   Current smoker   No alcohol or drug use.   Menopause in 2001   Last sexual activity in 2001   Review of Systems: A 12 point ROS was performed; pertinent positives and negatives were noted in the HPI   Objective:  Physical Exam: Filed Vitals:   01/27/14 0944  BP: 145/96  Pulse: 96  Temp: 98 F (36.7 C)  TempSrc: Oral  Height: 5\' 3"  (1.6 m)  Weight: 177 lb (80.287 kg)  SpO2: 100%   Constitutional: Vital signs reviewed.  Patient is a well-developed and well-nourished female in no acute distress and cooperative with exam. Alert and oriented x3.  Head: Normocephalic and atraumatic Ear: Left ear w/o cerumen, does not appear impacted. TM appears sclerotic. Right ear canal with cerumen. Able to visualize TM which also appears sclerotic.  Eyes: PERRL, EOMI Cardiovascular: RRR, no MRG Pulmonary/Chest: Normal respiratory effort, CTAB, no wheezes, rales, or rhonchi Abdominal: Soft. Non-tender, non-distended, bowel sounds are normal Musculoskeletal: Sitting in wheelchair. Brace on LLE Neurological: A&O x3, cranial nerve II-XII are grossly intact, no focal deficits. Skin: Warm, dry and intact. Psychiatric: Normal mood and affect. Speech and behavior is normal.  Assessment & Plan:   Please refer to Problem List based Assessment and Plan

## 2014-01-27 NOTE — Progress Notes (Signed)
INTERNAL MEDICINE TEACHING ATTENDING ADDENDUM - Laria Grimmett, MD: I reviewed and discussed at the time of visit with the resident Dr. Glenn, the patient's medical history, physical examination, diagnosis and results of pertinent tests and treatment and I agree with the patient's care as documented.  

## 2014-01-27 NOTE — Assessment & Plan Note (Signed)
Pt doing much better after irrigation of bilateral ears and abx. Today she denies pain. Left ear w/o cerumen. TM appears sclerotic. Right ear canal with cerumen. Able to visualize TM which also appears sclerotic.  - Irrigating right ear in the clinic today - F/u with PCP in 3 mo

## 2014-01-27 NOTE — Patient Instructions (Signed)
General Instructions:   Please bring your medicines with you each time you come to clinic.  Medicines may include prescription medications, over-the-counter medications, herbal remedies, eye drops, vitamins, or other pills.   Progress Toward Treatment Goals:  Treatment Goal 08/13/2013  Blood pressure at goal    Self Care Goals & Plans:  Self Care Goal 01/27/2014  Manage my medications take my medicines as prescribed; bring my medications to every visit; refill my medications on time  Monitor my health -  Eat healthy foods eat more vegetables; eat foods that are low in salt; eat baked foods instead of fried foods  Be physically active -    No flowsheet data found.   Care Management & Community Referrals:  Referral 08/13/2013  Referrals made for care management support none needed  Referrals made to community resources none

## 2014-02-11 ENCOUNTER — Other Ambulatory Visit: Payer: Self-pay | Admitting: Internal Medicine

## 2014-02-12 ENCOUNTER — Telehealth: Payer: Self-pay | Admitting: Internal Medicine

## 2014-02-12 NOTE — Telephone Encounter (Signed)
Spoke to patient in regards to her topical clindamycin refill request. She states her folliculitis is still itching on her scalp. I recommended her to call the clinic for an appointment for re-evaluation of her scalp. She agreed.

## 2014-03-06 ENCOUNTER — Other Ambulatory Visit: Payer: Self-pay | Admitting: Internal Medicine

## 2014-04-03 ENCOUNTER — Other Ambulatory Visit: Payer: Self-pay | Admitting: Internal Medicine

## 2014-04-06 NOTE — Telephone Encounter (Signed)
Patient needs to be seen in clinic. Otic drops were discontinued previously because she had cerumen impaction in ears. Likely needs disimpaction again. Also, cipro gel needed for scalp folliculitis. I spoke with patient previously about making appointment in clinic to have this re-evaluated.

## 2014-04-09 NOTE — Telephone Encounter (Signed)
Was unable to reach pt by phone, (no answer-no recorder)- Will send notice to front office to make pt an appointment.[name], Sarajane Fambrough Cassady12/17/20153:39 PM

## 2014-04-28 DIAGNOSIS — M5416 Radiculopathy, lumbar region: Secondary | ICD-10-CM | POA: Diagnosis not present

## 2014-04-28 DIAGNOSIS — M961 Postlaminectomy syndrome, not elsewhere classified: Secondary | ICD-10-CM | POA: Diagnosis not present

## 2014-04-28 DIAGNOSIS — M47817 Spondylosis without myelopathy or radiculopathy, lumbosacral region: Secondary | ICD-10-CM | POA: Diagnosis not present

## 2014-04-29 ENCOUNTER — Other Ambulatory Visit: Payer: Self-pay | Admitting: Internal Medicine

## 2014-05-01 ENCOUNTER — Other Ambulatory Visit: Payer: Self-pay | Admitting: Internal Medicine

## 2014-05-04 DIAGNOSIS — F332 Major depressive disorder, recurrent severe without psychotic features: Secondary | ICD-10-CM | POA: Diagnosis not present

## 2014-05-05 ENCOUNTER — Emergency Department (HOSPITAL_COMMUNITY)
Admission: EM | Admit: 2014-05-05 | Discharge: 2014-05-06 | Disposition: A | Discharge status: Home / Self Care | Payer: Medicare Other | Attending: Emergency Medicine | Admitting: Emergency Medicine

## 2014-05-05 ENCOUNTER — Encounter (HOSPITAL_COMMUNITY): Payer: Self-pay | Admitting: Emergency Medicine

## 2014-05-05 ENCOUNTER — Other Ambulatory Visit: Payer: Self-pay | Admitting: *Deleted

## 2014-05-05 DIAGNOSIS — Z7951 Long term (current) use of inhaled steroids: Secondary | ICD-10-CM | POA: Diagnosis not present

## 2014-05-05 DIAGNOSIS — H9201 Otalgia, right ear: Secondary | ICD-10-CM | POA: Diagnosis not present

## 2014-05-05 DIAGNOSIS — F329 Major depressive disorder, single episode, unspecified: Secondary | ICD-10-CM | POA: Insufficient documentation

## 2014-05-05 DIAGNOSIS — Z792 Long term (current) use of antibiotics: Secondary | ICD-10-CM | POA: Insufficient documentation

## 2014-05-05 DIAGNOSIS — R05 Cough: Secondary | ICD-10-CM | POA: Diagnosis not present

## 2014-05-05 DIAGNOSIS — R51 Headache: Secondary | ICD-10-CM | POA: Diagnosis not present

## 2014-05-05 DIAGNOSIS — R112 Nausea with vomiting, unspecified: Secondary | ICD-10-CM | POA: Diagnosis not present

## 2014-05-05 DIAGNOSIS — R110 Nausea: Secondary | ICD-10-CM | POA: Diagnosis not present

## 2014-05-05 DIAGNOSIS — I10 Essential (primary) hypertension: Secondary | ICD-10-CM | POA: Insufficient documentation

## 2014-05-05 DIAGNOSIS — Z79899 Other long term (current) drug therapy: Secondary | ICD-10-CM | POA: Diagnosis not present

## 2014-05-05 DIAGNOSIS — R200 Anesthesia of skin: Secondary | ICD-10-CM | POA: Diagnosis not present

## 2014-05-05 DIAGNOSIS — R519 Headache, unspecified: Secondary | ICD-10-CM

## 2014-05-05 DIAGNOSIS — Z87891 Personal history of nicotine dependence: Secondary | ICD-10-CM | POA: Diagnosis not present

## 2014-05-05 DIAGNOSIS — Z880 Allergy status to penicillin: Secondary | ICD-10-CM | POA: Insufficient documentation

## 2014-05-05 DIAGNOSIS — H5710 Ocular pain, unspecified eye: Secondary | ICD-10-CM | POA: Diagnosis not present

## 2014-05-05 DIAGNOSIS — E785 Hyperlipidemia, unspecified: Secondary | ICD-10-CM | POA: Diagnosis not present

## 2014-05-05 DIAGNOSIS — K219 Gastro-esophageal reflux disease without esophagitis: Secondary | ICD-10-CM | POA: Diagnosis not present

## 2014-05-05 DIAGNOSIS — H9202 Otalgia, left ear: Secondary | ICD-10-CM | POA: Diagnosis not present

## 2014-05-05 DIAGNOSIS — M542 Cervicalgia: Secondary | ICD-10-CM | POA: Diagnosis not present

## 2014-05-05 DIAGNOSIS — R059 Cough, unspecified: Secondary | ICD-10-CM

## 2014-05-05 DIAGNOSIS — R6889 Other general symptoms and signs: Secondary | ICD-10-CM | POA: Diagnosis not present

## 2014-05-05 DIAGNOSIS — J9811 Atelectasis: Secondary | ICD-10-CM | POA: Diagnosis not present

## 2014-05-05 LAB — COMPREHENSIVE METABOLIC PANEL
ALT: 21 U/L (ref 0–35)
AST: 28 U/L (ref 0–37)
Albumin: 4.3 g/dL (ref 3.5–5.2)
Alkaline Phosphatase: 82 U/L (ref 39–117)
Anion gap: 10 (ref 5–15)
BUN: 8 mg/dL (ref 6–23)
CO2: 25 mmol/L (ref 19–32)
Calcium: 9.5 mg/dL (ref 8.4–10.5)
Chloride: 104 mEq/L (ref 96–112)
Creatinine, Ser: 1.3 mg/dL — ABNORMAL HIGH (ref 0.50–1.10)
GFR calc Af Amer: 51 mL/min — ABNORMAL LOW (ref >90)
GFR calc non Af Amer: 44 mL/min — ABNORMAL LOW (ref >90)
Glucose, Bld: 108 mg/dL — ABNORMAL HIGH (ref 70–99)
Potassium: 3.5 mmol/L (ref 3.5–5.1)
Sodium: 139 mmol/L (ref 135–145)
Total Bilirubin: 0.3 mg/dL (ref 0.3–1.2)
Total Protein: 7.7 g/dL (ref 6.0–8.3)

## 2014-05-05 LAB — CBC WITH DIFFERENTIAL/PLATELET
Basophils Absolute: 0.1 10*3/uL (ref 0.0–0.1)
Basophils Relative: 1 % (ref 0–1)
Eosinophils Absolute: 0.3 10*3/uL (ref 0.0–0.7)
Eosinophils Relative: 4 % (ref 0–5)
HCT: 39.8 % (ref 36.0–46.0)
Hemoglobin: 13.6 g/dL (ref 12.0–15.0)
Lymphocytes Relative: 30 % (ref 12–46)
Lymphs Abs: 2.7 10*3/uL (ref 0.7–4.0)
MCH: 30.3 pg (ref 26.0–34.0)
MCHC: 34.2 g/dL (ref 30.0–36.0)
MCV: 88.6 fL (ref 78.0–100.0)
Monocytes Absolute: 0.6 10*3/uL (ref 0.1–1.0)
Monocytes Relative: 7 % (ref 3–12)
Neutro Abs: 5.3 10*3/uL (ref 1.7–7.7)
Neutrophils Relative %: 58 % (ref 43–77)
Platelets: 299 10*3/uL (ref 150–400)
RBC: 4.49 MIL/uL (ref 3.87–5.11)
RDW: 13.8 % (ref 11.5–15.5)
WBC: 9 10*3/uL (ref 4.0–10.5)

## 2014-05-05 MED ORDER — HYDROCORTISONE BUTYRATE 0.1 % EX CREA: 1.0000 "application " | TOPICAL_CREAM | Freq: Every day | CUTANEOUS | Status: DC

## 2014-05-05 NOTE — ED Notes (Signed)
Pt. arrived with EMS from home with multiple complaints : Right ear ache , headache , dry cough , emesis , and numbness at both hands onset this afternoon .

## 2014-05-06 ENCOUNTER — Encounter (HOSPITAL_COMMUNITY): Payer: Self-pay | Admitting: *Deleted

## 2014-05-06 ENCOUNTER — Emergency Department (HOSPITAL_COMMUNITY): Payer: Medicare Other

## 2014-05-06 DIAGNOSIS — J9811 Atelectasis: Secondary | ICD-10-CM | POA: Diagnosis not present

## 2014-05-06 DIAGNOSIS — M542 Cervicalgia: Secondary | ICD-10-CM | POA: Diagnosis not present

## 2014-05-06 DIAGNOSIS — R110 Nausea: Secondary | ICD-10-CM | POA: Diagnosis not present

## 2014-05-06 DIAGNOSIS — R51 Headache: Secondary | ICD-10-CM | POA: Diagnosis not present

## 2014-05-06 DIAGNOSIS — R05 Cough: Secondary | ICD-10-CM | POA: Diagnosis not present

## 2014-05-06 DIAGNOSIS — H9202 Otalgia, left ear: Secondary | ICD-10-CM | POA: Diagnosis not present

## 2014-05-06 DIAGNOSIS — H5710 Ocular pain, unspecified eye: Secondary | ICD-10-CM | POA: Diagnosis not present

## 2014-05-06 MED ORDER — HYDROMORPHONE HCL 1 MG/ML IJ SOLN: 1.0000 mg | Freq: Once | INTRAMUSCULAR | Status: DC

## 2014-05-06 MED ORDER — FENTANYL CITRATE 0.05 MG/ML IJ SOLN
INTRAMUSCULAR | Status: AC
  Filled 2014-05-06: qty 2

## 2014-05-06 MED ORDER — SODIUM CHLORIDE 0.9 % IV BOLUS (SEPSIS)
500.0000 mL | Freq: Once | INTRAVENOUS | Status: AC
  Administered 2014-05-06: 500 mL via INTRAVENOUS

## 2014-05-06 MED ORDER — METOCLOPRAMIDE HCL 5 MG/ML IJ SOLN
INTRAMUSCULAR | Status: AC
  Filled 2014-05-06: qty 2

## 2014-05-06 MED ORDER — DEXAMETHASONE SODIUM PHOSPHATE 10 MG/ML IJ SOLN
INTRAMUSCULAR | Status: AC
  Filled 2014-05-06: qty 1

## 2014-05-06 MED ORDER — IOHEXOL 350 MG/ML SOLN
50.0000 mL | Freq: Once | INTRAVENOUS | Status: AC | PRN
  Administered 2014-05-06: 50 mL via INTRAVENOUS

## 2014-05-06 MED ORDER — DIPHENHYDRAMINE HCL 50 MG/ML IJ SOLN
INTRAMUSCULAR | Status: AC
  Filled 2014-05-06: qty 1

## 2014-05-06 MED ORDER — ONDANSETRON HCL 4 MG/2ML IJ SOLN
4.0000 mg | Freq: Once | INTRAMUSCULAR | Status: AC
  Administered 2014-05-06: 4 mg via INTRAVENOUS
  Filled 2014-05-06: qty 2

## 2014-05-06 MED ORDER — FENTANYL CITRATE 0.05 MG/ML IJ SOLN
25.0000 ug | Freq: Once | INTRAMUSCULAR | Status: AC
  Administered 2014-05-06: 25 ug via INTRAVENOUS
  Filled 2014-05-06: qty 2

## 2014-05-06 NOTE — ED Provider Notes (Signed)
CSN: [id number]     Arrival date & time 05/05/14  2222 History  This chart was scribed for Elwin Mocha, MD by Bronson Curb, ED Scribe. This patient was seen in room D31C/D31C and the patient's care was started at 12:06 AM.    Chief Complaint  Patient presents with  . Otalgia  . Headache  . Numbness  . Cough  . Emesis    Patient is a 59 y.o. female presenting with ear pain, headaches, cough, and vomiting. The history is provided by the patient. No language interpreter was used.  Otalgia Location:  Right Behind ear:  No abnormality Associated symptoms: cough, headaches and vomiting   Headaches:    Severity:  Moderate   Onset quality:  Sudden   Timing:  Constant   Progression:  Unchanged   Chronicity:  New Headache Associated symptoms: cough, ear pain, nausea, numbness and vomiting   Cough Associated symptoms: ear pain and headaches   Emesis Associated symptoms: headaches      HPI Comments: KRISTELL WOODING is a 59 y.o. female who presents to the Emergency Department complaining of constant, throbbing pain to the right side of head that radiates from the top of her head down to her right arm and right ribs with onset PTA. Patient states she was coughing when she felt a "pop" in her head. She states she then saw her right was red and felt intense head pain. There is associated blurry vision, right ear pain, burning, right eye pain, right sided neck pain, nausea, vomiting, paresthesia to the bilateral hands. She reports taking Mucinex without significant improvement. She reports she has been unable to take her medications today due to nausea and vomiting.   Past Medical History  Diagnosis Date  . Hyperlipidemia   . Hypertension   . GERD (gastroesophageal reflux disease)   . Depression     f/b Dr. Cardell Peach at [facility]  . Degenerative disc disease, cervical     L spine 10/09 showing DJD and cervical spurring but no specific foraminal narrowing. Has been followed by  Rochele Pages, pain management physician in [place].  . Pyloric stenosis     s/p EGD with balloon dilatation on 06/23/2007 by Dr. Elnoria Howard  . Tobacco abuse     Did not tolerate Chantix (vomiting)  . Wears hearing aid     In r. ear, followed by ENT. 2/2 recurrent otitis  . Seborrheic dermatitis     of face, sees Dr. Merlyn Albert Luptom, dermatop cream TID  . Herniated disc     L5-S1. s/p laminectomy in 1993 by Dr. Shelle Iron.  MRI June 2005 showing L4/5 disc bulge + mild facet arthropathy. F/b Dr. Marzetta Board for pain management. Wheel chair bound.   Past Surgical History  Procedure Laterality Date  . Lumbar laminectomy  1993    Dr. Leandra Kern  . Egd w. balllon dilation for pyloric stenosis  2009    Dr. Elnoria Howard   Family History  Problem Relation Age of Onset  . Coronary artery disease Mother     deceased at 53  . Cancer Father     deceased at 53, lung cancer   History  Substance Use Topics  . Smoking status: Former Games developer  . Smokeless tobacco: Former Neurosurgeon     Comment: quit at 2012  . Alcohol Use: No   OB History    No data available     Review of Systems  HENT: Positive for ear pain.   Eyes: Positive  for redness and visual disturbance.  Respiratory: Positive for cough.   Gastrointestinal: Positive for nausea and vomiting.  Neurological: Positive for numbness and headaches.  All other systems reviewed and are negative.     Allergies  Codeine; Maxzide; Morphine; Oxycodone hcl; and Penicillins  Home Medications   Prior to Admission medications   Medication Sig Start Date End Date Taking? Authorizing Provider  amLODipine (NORVASC) 10 MG tablet TAKE 1 TABLET BY MOUTH EVERY DAY 03/10/14   Rich Number, MD  atorvastatin (LIPITOR) 80 MG tablet Take 1 tablet (80 mg total) by mouth daily at 6 PM. 12/04/13   Rich Number, MD  CIPRODEX otic suspension INSTILL 4 DROPS INTO BOTH EARS TWICE DAILY FOR 7 DAYS 03/10/14   Rich Number, MD  ciprofloxacin-hydrocortisone (CIPRO HC) otic suspension  Place 3 drops into both ears 2 (two) times daily. For 7 days 01/15/14   Ky Barban, MD  clindamycin (CLINDAGEL) 1 % gel APPLY TOPICALLY TWICE DAILY 05/04/14   Rich Number, MD  fluticasone (FLONASE) 50 MCG/ACT nasal spray INHALE 2 SPRAYS IN EACH NOSTRIL EVERY DAY 10/03/13   Lorretta Harp, MD  hydrocortisone butyrate (LUCOID) 0.1 % CREA cream Apply 1 application topically daily. 05/05/14   Rich Number, MD  ketoconazole (NIZORAL) 2 % shampoo APPLY TOPICALLY TWICE DAILY 09/09/13   Lorretta Harp, MD  loratadine (CLARITIN) 10 MG tablet TAKE 1 TABLET BY MOUTH EVERY DAY 04/06/14   Rich Number, MD  meclizine (ANTIVERT) 12.5 MG tablet TAKE 1 TABLET BY MOUTH THREE TIMES A DAY AS NEEDED FOR NAUSEA OR DIZZINESS 05/14/13   Lorretta Harp, MD  methocarbamol (ROBAXIN) 500 MG tablet TAKE 1 TABLET BY MOUTH THREE TIMES A DAY 04/15/13   Lorretta Harp, MD  NEXIUM 40 MG capsule TAKE 1 CAPSULE BY MOUTH ONCE DAILY BEFORE BREAKFAST 05/04/14   Carly Rivet, MD  NEXIUM 40 MG packet TAKE 40 MG BY MOUTH EVERY DAY BEFORE BREAKFAST 04/22/13   Inez Catalina, MD  nortriptyline (PAMELOR) 50 MG capsule Take 75 mg by mouth at bedtime. Prescribed by Mental Health.    Historical Provider, MD  polyethylene glycol powder (GLYCOLAX/MIRALAX) powder MIX 1 CAPFUL (17GM) IN A GLASS OF LIQUID AND DRINK EVERY DAY    Burns Spain, MD  sodium chloride (OCEAN NASAL SPRAY) 0.65 % nasal spray Place 1 spray into the nose as needed for congestion. 05/28/12 08/13/13  Lorretta Harp, MD  traZODone (DESYREL) 50 MG tablet Take 50 mg by mouth at bedtime. Prescribed by Mental Health.     Historical Provider, MD   Triage Vitals: BP 154/87 mmHg  Pulse 100  Temp(Src) 100.1 F (37.8 C) (Oral)  Resp 17  SpO2 98%  Physical Exam  Constitutional: She is oriented to person, place, and time. She appears well-developed and well-nourished. No distress.  HENT:  Head: Normocephalic and atraumatic.  Mouth/Throat: Oropharynx is clear and moist.  Eyes: EOM are normal. Pupils are  equal, round, and reactive to light.  Neck: Normal range of motion. Neck supple.  Cardiovascular: Normal rate and regular rhythm.  Exam reveals no friction rub.   No murmur heard. Pulmonary/Chest: Effort normal and breath sounds normal. No respiratory distress. She has no wheezes. She has no rales.  Abdominal: Soft. She exhibits no distension. There is no tenderness. There is no rebound.  Musculoskeletal: Normal range of motion. She exhibits no edema.  Neurological: She is alert and oriented to person, place, and time.  Skin: No rash noted. She is not diaphoretic.  Nursing note  and vitals reviewed.   ED Course  Procedures (including critical care time)  DIAGNOSTIC STUDIES: Oxygen Saturation is 98% on room air, normal by my interpretation.    COORDINATION OF CARE: At 0011 Discussed treatment plan with patient which includes pain medication and CT scan. Patient agrees.   Labs Review Labs Reviewed  COMPREHENSIVE METABOLIC PANEL - Abnormal; Notable for the following:    Glucose, Bld 108 (*)    Creatinine, Ser 1.30 (*)    GFR calc non Af Amer 44 (*)    GFR calc Af Amer 51 (*)    All other components within normal limits  CBC WITH DIFFERENTIAL    Imaging Review Ct Angio Head W/cm &/or Wo Cm  05/06/2014   CLINICAL DATA:  Acute onset right neck, a year, and eye pain with nausea.  EXAM: CT ANGIOGRAPHY HEAD  TECHNIQUE: Multidetector CT imaging of the head was performed using the standard protocol during bolus administration of intravenous contrast. Multiplanar CT image reconstructions and MIPs were obtained to evaluate the vascular anatomy.  CONTRAST:  50 cc of Omnipaque 350.  COMPARISON:  None.  FINDINGS: CT HEAD  Scattered patchy hypodensity within the periventricular deep white matter both cerebral hemispheres most consistent with chronic small vessel ischemic disease.  No acute large vessel territory infarct. No intracranial hemorrhage. No mass lesion or midline shift. Ventricles are  normal size without evidence of hydrocephalus. No extra-axial fluid collection.  Scalp soft tissues within normal limits. No acute abnormality seen about the orbits.  Calvarium intact. Paranasal sinuses are clear. No mastoid effusion.  CTA HEAD  Anterior circulation: The petrous, cavernous, and supra clinoid segments of the internal carotid arteries are widely patent bilaterally. Mild calcified plaque present within the cavernous right ICA without significant stenosis. A1 segments, anterior communicating artery, and anterior cerebral arteries within normal limits.  M1 segments widely patent bilaterally without proximal branch occlusion or significant stenosis. MCA bifurcations normal. Distal MCA branches well opacified and symmetric in appearance.  Posterior Circulation:  The left vertebral artery is dominant. Vertebral arteries are widely patent to the level of the vertebrobasilar junction. Posterior inferior cerebellar arteries are patent bilaterally. Basilar artery well opacified. Superior cerebellar arteries patent bilaterally. Posterior cerebral arteries well opacified bilaterally without proximal branch occlusion or significant stenosis. A small left posterior communicating artery is present.  No aneurysm or vascular malformation.  No abnormal enhancement on delayed sequence.  CTA NECK:  Visualized aortic arch is of normal caliber with normal 3 vessel morphology. No high-grade stenosis seen at the origin of the great vessels. Visualized subclavian arteries are well opacified.  Common carotid arteries are well opacified to the level the carotid bifurcations. Internal car arteries well opacified to the level of skullbase without significant stenosis or evidence of dissection.  External carotid arteries and their branches are within normal limits.  Both vertebral arteries arise from the subclavian arteries. Vertebral arteries are well opacified to the level of the skullbase without evidence of occlusion,  dissection, or other acute abnormality.  No acute soft tissue abnormality within the neck. No adenopathy. Thyroid gland is unremarkable. Visualized superior mediastinum within normal limits. Visualized lung apices are clear.  Moderate degenerative disc disease present at C4-5, C5-6, and C6-7. No acute osseus abnormality. No worrisome lytic or blastic osseous lesions.  IMPRESSION: Unremarkable CTA of the head and neck. No proximal branch occlusion, hemodynamically significant stenosis, or other acute vascular abnormality.   Electronically Signed   By: Rise MuBenjamin  McClintock M.D.   On: 05/06/2014  02:25   Dg Chest 2 View  05/06/2014   CLINICAL DATA:  Cough and left-sided ear pain.  EXAM: CHEST  2 VIEW  COMPARISON:  06/29/2008  FINDINGS: Mild bibasilar linear atelectasis. More confluent opacity in the likely right middle lobe may reflect pneumonia. The cardiomediastinal contours are normal. Pulmonary vasculature is normal. No consolidation, pleural effusion, or pneumothorax. No acute osseous abnormalities are seen.  IMPRESSION: Mild bibasilar atelectasis.  Probable right middle lobe pneumonia.   Electronically Signed   By: Rubye Oaks M.D.   On: 05/06/2014 00:48   Ct Angio Neck W/cm &/or Wo/cm  05/06/2014   CLINICAL DATA:  Acute onset right neck, a year, and eye pain with nausea.  EXAM: CT ANGIOGRAPHY HEAD  TECHNIQUE: Multidetector CT imaging of the head was performed using the standard protocol during bolus administration of intravenous contrast. Multiplanar CT image reconstructions and MIPs were obtained to evaluate the vascular anatomy.  CONTRAST:  50 cc of Omnipaque 350.  COMPARISON:  None.  FINDINGS: CT HEAD  Scattered patchy hypodensity within the periventricular deep white matter both cerebral hemispheres most consistent with chronic small vessel ischemic disease.  No acute large vessel territory infarct. No intracranial hemorrhage. No mass lesion or midline shift. Ventricles are normal size without  evidence of hydrocephalus. No extra-axial fluid collection.  Scalp soft tissues within normal limits. No acute abnormality seen about the orbits.  Calvarium intact. Paranasal sinuses are clear. No mastoid effusion.  CTA HEAD  Anterior circulation: The petrous, cavernous, and supra clinoid segments of the internal carotid arteries are widely patent bilaterally. Mild calcified plaque present within the cavernous right ICA without significant stenosis. A1 segments, anterior communicating artery, and anterior cerebral arteries within normal limits.  M1 segments widely patent bilaterally without proximal branch occlusion or significant stenosis. MCA bifurcations normal. Distal MCA branches well opacified and symmetric in appearance.  Posterior Circulation:  The left vertebral artery is dominant. Vertebral arteries are widely patent to the level of the vertebrobasilar junction. Posterior inferior cerebellar arteries are patent bilaterally. Basilar artery well opacified. Superior cerebellar arteries patent bilaterally. Posterior cerebral arteries well opacified bilaterally without proximal branch occlusion or significant stenosis. A small left posterior communicating artery is present.  No aneurysm or vascular malformation.  No abnormal enhancement on delayed sequence.  CTA NECK:  Visualized aortic arch is of normal caliber with normal 3 vessel morphology. No high-grade stenosis seen at the origin of the great vessels. Visualized subclavian arteries are well opacified.  Common carotid arteries are well opacified to the level the carotid bifurcations. Internal car arteries well opacified to the level of skullbase without significant stenosis or evidence of dissection.  External carotid arteries and their branches are within normal limits.  Both vertebral arteries arise from the subclavian arteries. Vertebral arteries are well opacified to the level of the skullbase without evidence of occlusion, dissection, or other acute  abnormality.  No acute soft tissue abnormality within the neck. No adenopathy. Thyroid gland is unremarkable. Visualized superior mediastinum within normal limits. Visualized lung apices are clear.  Moderate degenerative disc disease present at C4-5, C5-6, and C6-7. No acute osseus abnormality. No worrisome lytic or blastic osseous lesions.  IMPRESSION: Unremarkable CTA of the head and neck. No proximal branch occlusion, hemodynamically significant stenosis, or other acute vascular abnormality.   Electronically Signed   By: Rise Mu M.D.   On: 05/06/2014 02:25     EKG Interpretation   Date/Time:  Wednesday May 06 2014 00:00:36 EST Ventricular Rate:  110 PR Interval:  177 QRS Duration: 57 QT Interval:  377 QTC Calculation: 510 R Axis:   72 Text Interpretation:  Age not entered, assumed to be  59 years old for  purpose of ECG interpretation Sinus tachycardia Probable left atrial  enlargement Low voltage, precordial leads Nonspecific T abnormalities,  anterior leads Prolonged QT interval Artifact in lead(s) I II III aVR aVL  aVF V4 V5 Large amount of artifact Confirmed by Gwendolyn Grant  MD, Maizee Reinhold ([id number])  on 05/06/2014 12:04:49 AM      MDM   Final diagnoses:  Headache  Headache  Cough    38F presents with R sided headache, facial pain, neck pain. She reports symptoms started after coughing. After coughing she felt a very severe pop in her head that traveled down in her neck. She's not having right-sided headache that is throbbing, right-sided facial pain. The distribution she describes is not dermatomal. She does not history of headaches. This happened 2 days ago. On exam she has no carotid bruits, normal neurologic exam. She does have some facial tenderness and neck tenderness to touch.  With sudden onset with Valsalva, I am concerned for possible dissection. Patient's scans normal, normal angio. I discussed LP to further look for [facility], as she's 2 days out from the onset. She  refused stating she has had one before and doesn't want one now. She understands the risks of missing a SAH, and still does not want an LP.  Patient is feeling better, migraine cocktail given with resolution of symptoms. Stable for discharge.   I personally performed the services described in this documentation, which was scribed in my presence. The recorded information has been reviewed and is accurate.     Elwin Mocha, MD 05/06/14 [phone]

## 2014-05-06 NOTE — ED Notes (Signed)
See downtime forms for documentation and d/c.

## 2014-05-13 ENCOUNTER — Encounter: Payer: Self-pay | Admitting: Internal Medicine

## 2014-05-13 ENCOUNTER — Ambulatory Visit (INDEPENDENT_AMBULATORY_CARE_PROVIDER_SITE_OTHER): Payer: Medicare Other | Admitting: Internal Medicine

## 2014-05-13 VITALS — BP 143/81 | HR 94 | Temp 98.3°F | Wt 174.9 lb

## 2014-05-13 DIAGNOSIS — L219 Seborrheic dermatitis, unspecified: Secondary | ICD-10-CM

## 2014-05-13 DIAGNOSIS — M26609 Unspecified temporomandibular joint disorder, unspecified side: Secondary | ICD-10-CM

## 2014-05-13 DIAGNOSIS — M2660 Temporomandibular joint disorder, unspecified: Secondary | ICD-10-CM | POA: Diagnosis not present

## 2014-05-13 MED ORDER — HYDROCORTISONE BUTYRATE 0.1 % EX CREA: 1.0000 "application " | TOPICAL_CREAM | Freq: Every day | CUTANEOUS | Status: DC

## 2014-05-13 MED ORDER — IBUPROFEN 800 MG PO TABS: 800.0000 mg | ORAL_TABLET | Freq: Three times a day (TID) | ORAL | Status: DC | PRN

## 2014-05-13 NOTE — Assessment & Plan Note (Signed)
Needs refill of hydrocortisone cream, which has been helping with her seborrheic dermatitis. -Refilled hydrocortisone 0.1% cream.

## 2014-05-13 NOTE — Assessment & Plan Note (Addendum)
Work-up in ER including CTA unremarkable.  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  She was seen by ENT this summer after similar symptoms (no paresthesias or arm pain), and they gave her this diagnosis as well.  She was told to try ibuprofen with plans to refer to neurology if no relief, but she has not been taking ibuprofen.  She has a history of muscle spasms, chronic back pain, and depression, so she is already on the main therapies for TMD (muscle relaxants, nortriptyline).  This history is likely contributing to her pain syndrome. -Referred to neurology with new paresthesia symptoms. -Ibuprofen 800 mg q8h PRN. -Continue gabapentin, Robaxin, and nortriptyline.

## 2014-05-13 NOTE — Patient Instructions (Addendum)
Thank you for coming to clinic today Ms. Teters.  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 -I will refer you to Neurology for their recommendations.  They will call you with the appointment. -Until then, take ibuprofen 800 mg every 8 hours as needed to help with the pain. -Please make a follow up appointment to return to clinic in 2 months.  Please try to bring all your medicines next time. This helps us take good care of you and stops mistakes from medicines that could hurt you.

## 2014-05-13 NOTE — Progress Notes (Signed)
Internal Medicine Clinic Attending  Case discussed with Dr. Moding at the time of the visit.  We reviewed the resident's history and exam and pertinent patient test results.  I agree with the assessment, diagnosis, and plan of care documented in the resident's note. 

## 2014-05-13 NOTE — Progress Notes (Signed)
   Subjective:    Patient ID: Miranda Gaines, female    DOB: [birth year], 59 y.o.   MRN: [id number]  HPI Miranda Gaines is a 59 year old woman with history of DDD with leg weakness, HLD, and HTN who presents for follow up after an ER visit for a headache.  She developed sudden throbbing right sided head pain that radiated down her right arm and ribs after coughing on 05/06/14.  It was associated with blurry vision, right ear pain, right eye pain, nausea, vomiting and R sided paresthesia.  In the ER, CT angiogram was negative, and she refused LP to rule out SAH.  She was given a Fentanyl and Zofran with improvement of her symptoms, and discharged with a prescription for ibuprofen that she has not filled.  She says that the pain was completely gone in the ER, but the next day it came back. She says that the pain is sharp in the top of her head and goes to her right eye.  She also says that she has a dry scratchy throat and it hurts to swallow.  She also reports subjective fevers or chills.  She reports that her R hand feels weaker than normal and she is having tingling in her fingers.  She reports bilateral blurry vision and light-headedness. She fells popping when she chews, but denies her jaw locking.   Review of Systems  Constitutional: Negative for fever, chills and fatigue.  HENT: Positive for sore throat. Negative for congestion and rhinorrhea.   Eyes: Positive for visual disturbance (Bilateral.).  Respiratory: Negative for cough, chest tightness and shortness of breath.   Cardiovascular: Negative for chest pain.  Gastrointestinal: Negative for nausea, vomiting, abdominal pain, diarrhea and constipation.  Genitourinary: Negative for dysuria.  Musculoskeletal: Positive for myalgias and arthralgias.  Skin: Negative for rash.  Neurological: Positive for dizziness, weakness, numbness and headaches.       Objective:   Physical Exam  Constitutional: She is oriented to person, place, and  time. She appears well-developed and well-nourished. No distress.  HENT:  Head: Normocephalic and atraumatic.  Mouth/Throat: No oropharyngeal exudate.  Hearing aids in place, ear canals clear.  Tenderness to palpation over R TMJ, along jaw line, and R neck.  Eyes: Conjunctivae and EOM are normal. Pupils are equal, round, and reactive to light. No scleral icterus.  Cardiovascular: Normal rate, regular rhythm and normal heart sounds.   Pulmonary/Chest: Effort normal and breath sounds normal. No respiratory distress. She has no wheezes.  Abdominal: Soft. Bowel sounds are normal. She exhibits no distension. There is no tenderness.  Musculoskeletal: Normal range of motion. She exhibits no edema.  Neurological: She is alert and oriented to person, place, and time. No cranial nerve deficit.  3/5 strength in legs bilaterally.  5/5 strength in upper extremities.  Reports decreased sensation on R side of face, otherwise sensation to light touch intact.  Skin: Skin is warm and dry. No rash noted. No erythema.          Assessment & Plan:  Please see problem-based assessment and plan.

## 2014-05-29 ENCOUNTER — Other Ambulatory Visit: Payer: Self-pay | Admitting: Internal Medicine

## 2014-06-01 ENCOUNTER — Telehealth: Payer: Self-pay | Admitting: Internal Medicine

## 2014-06-01 NOTE — Telephone Encounter (Signed)
Spoke with Miranda Gaines in regards to request for ibuprofen 800 mg PRN. Patient states she is still having significant temporomandibular dysfunction pain. I explained that her Cr was elevated at 1.3 and I do not want to refill this medication as it can decrease her kidney function. She expressed that she has not been urinating as well since taking the high dose ibuprofen but that it has improved since she stopped taking the ibuprofen a few days ago. I instructed her to stop using the ibuprofen and to use ice/hot compresses and Tylenol as needed for the pain. 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 I instructed her to call the clinic if she feels her urination is not getting better.

## 2014-06-09 ENCOUNTER — Encounter: Payer: Self-pay | Admitting: Diagnostic Neuroimaging

## 2014-06-09 ENCOUNTER — Ambulatory Visit (INDEPENDENT_AMBULATORY_CARE_PROVIDER_SITE_OTHER): Payer: Medicare Other | Admitting: Diagnostic Neuroimaging

## 2014-06-09 VITALS — BP 163/88 | HR 92 | Ht 63.0 in

## 2014-06-09 DIAGNOSIS — G501 Atypical facial pain: Secondary | ICD-10-CM | POA: Diagnosis not present

## 2014-06-09 DIAGNOSIS — G44099 Other trigeminal autonomic cephalgias (TAC), not intractable: Secondary | ICD-10-CM | POA: Diagnosis not present

## 2014-06-09 NOTE — Progress Notes (Signed)
[facility]  PATIENT: Miranda CoveShirley M Brunelli DOB: 1955-06-07  REFERRING CLINICIAN: Moding HISTORY FROM: patient  REASON FOR VISIT: new consult   HISTORICAL  CHIEF COMPLAINT:  Chief Complaint  Patient presents with  . New Evaluation    temporomandibular dysfuction syndrome    HISTORY OF PRESENT ILLNESS:   59 year old right-handed female here for evaluation of right facial pain. For past 3-4 years patient has had intermittent pain, cold sensation, on the right scalp, right ear, right face, right neck, right arm and right hand. Sometimes she has symptoms in her right leg. Symptoms are intermittent. She was in the emergency room 05/06/2014 for exacerbation of these symptoms. Patient had CT angiogram of the head which was unremarkable. She was recommended to have lumbar puncture which she declined. Patient followed up with PCP and then was referred to me for further evaluation. This past summer patient was also seen by ENT and they raise possibility of temporomandibular joint dysfunction syndrome. However patient has never seen a dentist for this problem.  Of note patient [place] in [facility], Marzetta Board(Jane Keilitz) for chronic low back pain, lumbar radiculopathy, and is on amphetamine, trazodone, nortriptyline for pain control. Patient did not discuss her face/head symptoms with her outside neurologist.    REVIEW OF SYSTEMS: Full 14 system review of systems performed and notable only for headache numbness weakness difficult to swelling dizziness or pain aching muscles allergies urination problem's cough eye pain blurred vision hearing loss ringing in ears.   ALLERGIES: Allergies  Allergen Reactions  . Codeine Nausea And Vomiting and Other (See Comments)    anxiety  . Maxzide [Hydrochlorothiazide W-Triamterene]     Muscle spasm   . Morphine     REACTION: sweating, nervous  . Oxycodone Hcl     REACTION: sweating, nervous, feels like she could pass out  .  Penicillins Nausea And Vomiting and Other (See Comments)    anxiety    HOME MEDICATIONS: Outpatient Prescriptions Prior to Visit  Medication Sig Dispense Refill  . amLODipine (NORVASC) 10 MG tablet Take 0.5 tablets (5 mg total) by mouth daily. 15 tablet 1  . atorvastatin (LIPITOR) 80 MG tablet TAKE 1 TABLET BY MOUTH ONCE DAILY AT 6PM 30 tablet 3  . ciprofloxacin-hydrocortisone (CIPRO HC) otic suspension Place 3 drops into both ears 2 (two) times daily. For 7 days (Patient taking differently: Place 3 drops into both ears 2 (two) times daily. ) 10 mL 1  . gabapentin (NEURONTIN) 300 MG capsule Take 300 mg by mouth 3 (three) times daily.    . hydrocortisone butyrate (LUCOID) 0.1 % CREA cream Apply 1 application topically daily. 45 g 2  . loratadine (CLARITIN) 10 MG tablet TAKE 1 TABLET BY MOUTH EVERY DAY 30 tablet 2  . NEXIUM 40 MG capsule TAKE 1 CAPSULE BY MOUTH ONCE DAILY BEFORE BREAKFAST 30 capsule 3  . NEXIUM 40 MG packet TAKE 40 MG BY MOUTH EVERY DAY BEFORE BREAKFAST 30 each 3  . traZODone (DESYREL) 50 MG tablet Take 50 mg by mouth at bedtime. Prescribed by Mental Health.     Marland Kitchen. CIPRODEX otic suspension INSTILL 4 DROPS INTO BOTH EARS TWICE DAILY FOR 7 DAYS (Patient not taking: Reported on 05/06/2014) 7.5 mL 0  . ketoconazole (NIZORAL) 2 % shampoo APPLY TOPICALLY TWICE DAILY (Patient not taking: Reported on 06/09/2014) 120 mL 3  . sodium chloride (OCEAN NASAL SPRAY) 0.65 % nasal spray Place 1 spray into the nose as needed for congestion. 104 mL 3  .  clindamycin (CLINDAGEL) 1 % gel APPLY TOPICALLY TWICE DAILY 30 g 0  . fluticasone (FLONASE) 50 MCG/ACT nasal spray INHALE 2 SPRAYS IN EACH NOSTRIL EVERY DAY 16 g 2  . ibuprofen (ADVIL,MOTRIN) 800 MG tablet Take 1 tablet (800 mg total) by mouth every 8 (eight) hours as needed. 30 tablet 0  . meclizine (ANTIVERT) 12.5 MG tablet TAKE 1 TABLET BY MOUTH THREE TIMES A DAY AS NEEDED FOR NAUSEA OR DIZZINESS 30 tablet 1  . methocarbamol (ROBAXIN) 500 MG tablet  TAKE 1 TABLET BY MOUTH THREE TIMES A DAY 90 tablet 5  . nortriptyline (PAMELOR) 50 MG capsule Take 75 mg by mouth at bedtime. Prescribed by Mental [facility].    . polyethylene glycol powder (GLYCOLAX/MIRALAX) powder MIX 1 CAPFUL (17GM) IN A GLASS OF LIQUID AND DRINK EVERY DAY 527 g 3   No facility-administered medications prior to visit.    PAST MEDICAL HISTORY: Past Medical History  Diagnosis Date  . Hyperlipidemia   . Hypertension   . GERD (gastroesophageal reflux disease)   . Depression     f/b Dr. Cardell Peach at [facility]  . Degenerative disc disease, cervical     L spine 10/09 showing DJD and cervical spurring but no specific foraminal narrowing. Has been followed by Rochele Pages, pain management physician in [place].  . Pyloric stenosis     s/p EGD with balloon dilatation on 06/23/2007 by Dr. Elnoria Howard  . Tobacco abuse     Did not tolerate Chantix (vomiting)  . Wears hearing aid     In r. ear, followed by ENT. 2/2 recurrent otitis  . Seborrheic dermatitis     of face, sees Dr. Merlyn Albert Luptom, dermatop cream TID  . Herniated disc     L5-S1. s/p laminectomy in 1993 by Dr. Shelle Iron.  MRI June 2005 showing L4/5 disc bulge + mild facet arthropathy. F/b Dr. Marzetta Board for pain management. Wheel chair bound.    PAST SURGICAL HISTORY: Past Surgical History  Procedure Laterality Date  . Lumbar laminectomy  1993    Dr. Leandra Kern  . Egd w. balllon dilation for pyloric stenosis  2009    Dr. Elnoria Howard    FAMILY HISTORY: Family History  Problem Relation Age of Onset  . Coronary artery disease Mother     deceased at 27  . Cancer Father     deceased at 80, lung cancer    SOCIAL HISTORY:  History   Social History  . Marital Status: Legally Separated    Spouse Name: N/A  . Number of Children: 0  . Years of Education: N/A   Occupational History  . diabled    Social History Main Topics  . Smoking status: Former Smoker    Quit date: 02/10/2014  . Smokeless tobacco: Former Neurosurgeon      Comment: quit at 2012  . Alcohol Use: No  . Drug Use: No  . Sexual Activity: Not on file   Other Topics Concern  . Not on file   Social History Narrative   Divorced, no children.   Wheelchair bound from chronic back pain 2/2 workplace injury in her 70s. She used to be a Psychologist, occupational.   Lives alone in a house.   No alcohol or drug use.   Menopause in 2001   Last sexual activity in 2001     PHYSICAL EXAM  Filed Vitals:   06/09/14 1047  BP: 163/88  Pulse: 92  Height:  (1.6 m)    Body mass index  is 0.00 kg/(m^2).   Visual Acuity Screening   Right eye Left eye Both eyes  Without correction: unable unable   With correction:       No flowsheet data found.  GENERAL EXAM: Patient is in no distress; well developed, nourished and groomed; neck is supple  CARDIOVASCULAR: Regular rate and rhythm, no murmurs, no carotid bruits  NEUROLOGIC: MENTAL STATUS: awake, alert, oriented to person, place and time, recent and remote memory intact, normal attention and concentration, language fluent, comprehension intact, naming intact, fund of knowledge appropriate CRANIAL NERVE: no papilledema on fundoscopic exam, pupils equal and reactive to light, visual fields full to confrontation, extraocular muscles intact, no nystagmus, facial sensation and strength symmetric, HEARING REDUCED (BILATERAL HEARING AIDS), palate elevates symmetrically, uvula midline, shoulder shrug symmetric, tongue midline. MILD SLURRED SPEECH. MOTOR: normal bulk and tone; BUE (4, LIMITED BY PAIN); BLE (HF 3, KE 3, KF 3, DF 4) SENSORY: DECR IN BLE TO ALL MODALITIES COORDINATION: finger-nose-finger, fine finger movements normal REFLEXES: deep tendon reflexes TRACE and symmetric GAIT/STATION: SITTING IN POWER CHAIR    DIAGNOSTIC DATA (LABS, IMAGING, TESTING) - I reviewed patient records, labs, notes, testing and imaging myself where available.  Lab Results  Component Value Date   WBC 9.0 05/05/2014   HGB 13.6  05/05/2014   HCT 39.8 05/05/2014   MCV 88.6 05/05/2014   PLT 299 05/05/2014      Component Value Date/Time   NA 139 05/05/2014 2239   K 3.5 05/05/2014 2239   CL 104 05/05/2014 2239   CO2 25 05/05/2014 2239   GLUCOSE 108* 05/05/2014 2239   BUN 8 05/05/2014 2239   CREATININE 1.30* 05/05/2014 2239   CREATININE 1.02 12/24/2012 1444   CALCIUM 9.5 05/05/2014 2239   PROT 7.7 05/05/2014 2239   ALBUMIN 4.3 05/05/2014 2239   AST 28 05/05/2014 2239   ALT 21 05/05/2014 2239   ALKPHOS 82 05/05/2014 2239   BILITOT 0.3 05/05/2014 2239   GFRNONAA 44* 05/05/2014 2239   GFRNONAA 61 12/24/2012 1444   GFRAA 51* 05/05/2014 2239   GFRAA 71 12/24/2012 1444   Lab Results  Component Value Date   CHOL 220* 12/24/2012   HDL 31* 12/24/2012   LDLCALC 147* 12/24/2012   TRIG 211* 12/24/2012   CHOLHDL 7.1 12/24/2012   Lab Results  Component Value Date   HGBA1C 5.7 04/26/2011   No results found for: VITAMINB12 Lab Results  Component Value Date   TSH 1.846 *Test methodology is 3rd generation TSH* 05/06/2007   I reviewed images myself and agree with interpretation. -VRP  05/06/14 CTA head/neck - Unremarkable CTA of the head and neck. No proximal branch occlusion, hemodynamically significant stenosis, or other acute vascular abnormality.    ASSESSMENT AND PLAN  59 y.o. year old female here with 3-4 years of intermittent right-sided headache/facial pain. May represent trigeminal neuralgia, trigeminal headache syndrome, atypical facial pain or dental problem.  Ddx: trigeminal neuralgia, trigeminal autonomic cephalgia, atypical facial pain syndrome, dental pathology, TMJ syndrome  PLAN: - patient already established with outside neurology Al Decant, PA; [facility]) for pain mgmt of low back issue; would recommend she follow up there for further evaluation of current issue. May consider MRI brain to rule out secondary causes of pain, and defer pain mgmt to them.  Return if symptoms  worsen or fail to improve, for return to PCP and Neurology Marzetta Board PA; [facility]).    Suanne Marker, MD 06/09/2014, 11:56 AM Certified in Neurology, Neurophysiology and Neuroimaging  [facility] [address] [place], [place] [address] [phone]

## 2014-06-09 NOTE — Patient Instructions (Signed)
Follow up with primary care.  Follow up with your other neurologist Marzetta Board(Jane Keilitz, [place]   Follow up with dentist for TMJ issues.

## 2014-06-10 ENCOUNTER — Encounter: Payer: Self-pay | Admitting: Diagnostic Neuroimaging

## 2014-06-19 DIAGNOSIS — M5412 Radiculopathy, cervical region: Secondary | ICD-10-CM | POA: Diagnosis not present

## 2014-06-19 DIAGNOSIS — M961 Postlaminectomy syndrome, not elsewhere classified: Secondary | ICD-10-CM | POA: Diagnosis not present

## 2014-06-19 DIAGNOSIS — M5030 Other cervical disc degeneration, unspecified cervical region: Secondary | ICD-10-CM | POA: Diagnosis not present

## 2014-06-19 DIAGNOSIS — R51 Headache: Secondary | ICD-10-CM | POA: Diagnosis not present

## 2014-06-26 ENCOUNTER — Other Ambulatory Visit: Payer: Self-pay | Admitting: Internal Medicine

## 2014-06-26 DIAGNOSIS — R51 Headache: Secondary | ICD-10-CM | POA: Diagnosis not present

## 2014-06-26 DIAGNOSIS — M47812 Spondylosis without myelopathy or radiculopathy, cervical region: Secondary | ICD-10-CM | POA: Diagnosis not present

## 2014-06-26 DIAGNOSIS — G8929 Other chronic pain: Secondary | ICD-10-CM | POA: Diagnosis not present

## 2014-06-26 DIAGNOSIS — R200 Anesthesia of skin: Secondary | ICD-10-CM | POA: Diagnosis not present

## 2014-06-26 DIAGNOSIS — M4722 Other spondylosis with radiculopathy, cervical region: Secondary | ICD-10-CM | POA: Diagnosis not present

## 2014-06-30 NOTE — Telephone Encounter (Signed)
Needs appt, preferably PCP, next 30 days. IMC if can't sch with PCP

## 2014-07-24 ENCOUNTER — Other Ambulatory Visit: Payer: Self-pay | Admitting: Internal Medicine

## 2014-08-06 ENCOUNTER — Telehealth: Payer: Self-pay | Admitting: Internal Medicine

## 2014-08-06 NOTE — Telephone Encounter (Signed)
Call to patient to confirm appointment for 08/10/14 at 10:45 no answer

## 2014-08-10 ENCOUNTER — Other Ambulatory Visit (HOSPITAL_COMMUNITY)
Admission: RE | Admit: 2014-08-10 | Discharge: 2014-08-10 | Disposition: A | Discharge status: Home / Self Care | Payer: Medicare Other | Source: Ambulatory Visit | Attending: Internal Medicine | Admitting: Internal Medicine

## 2014-08-10 ENCOUNTER — Ambulatory Visit (INDEPENDENT_AMBULATORY_CARE_PROVIDER_SITE_OTHER): Payer: Medicare Other | Admitting: Internal Medicine

## 2014-08-10 ENCOUNTER — Encounter: Payer: Self-pay | Admitting: Internal Medicine

## 2014-08-10 VITALS — BP 154/82 | HR 90 | Temp 98.0°F | Wt 173.6 lb

## 2014-08-10 DIAGNOSIS — M25562 Pain in left knee: Secondary | ICD-10-CM | POA: Diagnosis not present

## 2014-08-10 DIAGNOSIS — M545 Low back pain: Secondary | ICD-10-CM

## 2014-08-10 DIAGNOSIS — M25561 Pain in right knee: Secondary | ICD-10-CM | POA: Diagnosis not present

## 2014-08-10 DIAGNOSIS — F32A Depression, unspecified: Secondary | ICD-10-CM

## 2014-08-10 DIAGNOSIS — J329 Chronic sinusitis, unspecified: Secondary | ICD-10-CM

## 2014-08-10 DIAGNOSIS — Z0000 Encounter for general adult medical examination without abnormal findings: Secondary | ICD-10-CM | POA: Diagnosis not present

## 2014-08-10 DIAGNOSIS — F17200 Nicotine dependence, unspecified, uncomplicated: Secondary | ICD-10-CM

## 2014-08-10 DIAGNOSIS — Z1151 Encounter for screening for human papillomavirus (HPV): Secondary | ICD-10-CM | POA: Insufficient documentation

## 2014-08-10 DIAGNOSIS — F329 Major depressive disorder, single episode, unspecified: Secondary | ICD-10-CM | POA: Diagnosis not present

## 2014-08-10 DIAGNOSIS — Z720 Tobacco use: Secondary | ICD-10-CM | POA: Diagnosis not present

## 2014-08-10 DIAGNOSIS — Z124 Encounter for screening for malignant neoplasm of cervix: Secondary | ICD-10-CM | POA: Insufficient documentation

## 2014-08-10 DIAGNOSIS — M26609 Unspecified temporomandibular joint disorder, unspecified side: Secondary | ICD-10-CM

## 2014-08-10 DIAGNOSIS — E785 Hyperlipidemia, unspecified: Secondary | ICD-10-CM | POA: Diagnosis not present

## 2014-08-10 DIAGNOSIS — H6123 Impacted cerumen, bilateral: Secondary | ICD-10-CM | POA: Diagnosis not present

## 2014-08-10 DIAGNOSIS — M2660 Temporomandibular joint disorder, unspecified: Secondary | ICD-10-CM | POA: Diagnosis not present

## 2014-08-10 DIAGNOSIS — I10 Essential (primary) hypertension: Secondary | ICD-10-CM

## 2014-08-10 DIAGNOSIS — G8929 Other chronic pain: Secondary | ICD-10-CM | POA: Diagnosis not present

## 2014-08-10 LAB — LIPID PANEL
Cholesterol: 246 mg/dL — ABNORMAL HIGH (ref 0–200)
HDL: 34 mg/dL — ABNORMAL LOW (ref >=46)
LDL Cholesterol: 185 mg/dL — ABNORMAL HIGH (ref 0–99)
Total CHOL/HDL Ratio: 7.2 Ratio
Triglycerides: 135 mg/dL (ref <150)
VLDL: 27 mg/dL (ref 0–40)

## 2014-08-10 LAB — BASIC METABOLIC PANEL WITH GFR
BUN: 10 mg/dL (ref 6–23)
CO2: 25 mEq/L (ref 19–32)
Calcium: 9.6 mg/dL (ref 8.4–10.5)
Chloride: 106 mEq/L (ref 96–112)
Creat: 0.97 mg/dL (ref 0.50–1.10)
GFR, Est African American: 74 mL/min
GFR, Est Non African American: 64 mL/min
Glucose, Bld: 99 mg/dL (ref 70–99)
Potassium: 4.2 mEq/L (ref 3.5–5.3)
Sodium: 140 mEq/L (ref 135–145)

## 2014-08-10 MED ORDER — MENTHOL (TOPICAL ANALGESIC) 4 % EX GEL: 1.0000 "application " | Freq: Two times a day (BID) | CUTANEOUS | Status: DC

## 2014-08-10 NOTE — Progress Notes (Signed)
Subjective:    Patient ID: Miranda Gaines, female    DOB: 07/24/1955, 59 y.o.   MRN: [id number]  Hypertension   Miranda Gaines is a 59yo woman with PMHx of HTN, HLD, chronic sinusitis, chronic low back pain and knee pain, and depression who presents today for the following:  HTN: BP 154/82 today. Patient is prescribed Amlodipine 5 mg daily, but reports she has not been taking this medication for several weeks. She states she was having difficulty with urinating, dribbling in particular, and that once she stopped her BP meds this went away. I spoke with her on the phone on 06/01/14 in regards to her elevated Cr and to stop using ibuprofen for her TMJ syndrome. She had mentioned her urination problems during this encounter and was advised to call the clinic if it continues. She denies any current urination issues.   HLD: Last lipid profile in 12/2012 showed Chol 220, Trigly 211, HDL 31, and LDL 147. Patient is on Atorvastatin 80 mg daily at home. She reports compliance with this medication.   Chronic Sinusitis: Patient states she feels her ears "opening and closing". She denies any sinus pressure or pain, ear pain, fever, chills, and rhinorrhea. She states she has excessive ear wax build up and attributes this to her symptoms. She takes Claritin daily for her sinusitis.   TMJ Syndrome: Patient took ibuprofen 800 mg Q6H PRN but was then advised to discontinue this medication due to her elevated Cr. She denies any current jaw or neck pain and headache. She reports some paresthesias in her right arm. She saw Dr. Marjory Lies with neurology on 06/09/14. He recommended for her to follow up with her established neurology office Miranda Board, PA at [facility]) and to consider MRI brain to rule out secondary causes of pain. She is also taking Nortriptyline 75 mg QHS. She states her symptoms have improved.   Chronic Low Back Pain/Bilateral Knee Pain: Patient reports her chronic back and knee pain is well  controlled with using Biogel topical. She is also taking Flexeril 10 mg daily and Gabapentin 300 mg TID.   Depression: Patient is taking Nortriptyline 75 mg QHS and Trazodone 50 mg QHS for her depression. She states she is not happy with her care at [facility] and would like to be referred to a new psychiatrist.   Smoking History: Patient reports she is no longer smoking cigarettes. She quit 2-3 years ago. I congratulated her for her continued cessation.    Review of Systems General: Denies night sweats, changes in weight, changes in appetite HEENT: Denies changes in vision, sore throat CV: Denies CP, palpitations, SOB, orthopnea Pulm: Denies SOB, cough, wheezing GI: Denies abdominal pain, nausea, vomiting, diarrhea, constipation, melena, hematochezia GU: Denies dysuria, hematuria, frequency Msk: See above Neuro: Denies weakness Skin: Denies rashes, bruising    Objective:   Physical Exam General: sitting in power chair, NAD HEENT: [place]/AT, EOMI, sclera anicteric, bilateral hearing aids, large amount of wax in left ear canal and minimal on right, mucus membranes moist CV: RRR, no m/g/r Pulm: CTA bilaterally, breaths non-labored Abd: BS+, soft, obese, non-tender Pelvic: external genitalia appear normal. No ulcers, sores, or lesions noted. There is a skin tag on the left side of her vagina. Cervix appears normal. Small amount of white vaginal discharge noted.  Ext: warm, no edema.  Neuro: alert and oriented x 3. Strength 5/5 in upper extremities and 4/5 in bilateral lower extremities secondary to pain.      Assessment &  Plan:  Please refer to individual A&Ps.

## 2014-08-10 NOTE — Assessment & Plan Note (Signed)
BP Readings from Last 3 Encounters:  08/10/14 154/82  06/09/14 163/88  05/13/14 143/81    Lab Results  Component Value Date   NA 139 05/05/2014   K 3.5 05/05/2014   CREATININE 1.30* 05/05/2014    Assessment: Blood pressure control: mildly elevated Progress toward BP goal:  deteriorated Comments: BP mildly elevated today. Patient has been off of her Amlodipine for several weeks now.   Plan: Medications:  continue current medications Other plans:  - Patient instructed to restart Amlodipine 5 mg daily. Educated on benefits of BP control.  - Check bmet today to assess Cr since was elevated on last bmet

## 2014-08-10 NOTE — Assessment & Plan Note (Signed)
Patient with "ear popping" sensation. She had a large amount of ear wax present in her left ear canal.  - Ears cleaned with warm water and wax removed

## 2014-08-10 NOTE — Assessment & Plan Note (Signed)
Patient reports chronic pain after falling on the cement several years ago. She states topical Biogel alleviates her pain. - Given prescription for Biogel. Also told patient that this can be purchased OTC.

## 2014-08-10 NOTE — Assessment & Plan Note (Signed)
Patient wishes to see a different provider for her depression. [facility]. Depression is well controlled with Nortriptyline 75 mg QHS and Trazodone 50 mg QHS. - Continue Nortriptyline and Trazodone - Referral to psychiatry made

## 2014-08-10 NOTE — Assessment & Plan Note (Signed)
Has not had lipid profile for 2 years now.  - Check lipid profile - Continue Atorvastatin 80 mg daily

## 2014-08-10 NOTE — Patient Instructions (Addendum)
It was a pleasure seeing you today, Ms. Winborne.  1. High blood pressure - Start taking Amlodipine 5 mg daily again  2. High cholesterol - Checking labs today - Continue Atorvastatin 80 mg daily  3. Back pain/Knee pain - Prescription for Biogel given - Discuss continuing Flexeril with whoever prescribed this medication. This should be taken only short term.  4. Pap smear - I will call you with results  5. Depression - Continue Trazodone - I will place referral for new psychiatrist   General Instructions:   Please bring your medicines with you each time you come to clinic.  Medicines may include prescription medications, over-the-counter medications, herbal remedies, eye drops, vitamins, or other pills.   Progress Toward Treatment Goals:  Treatment Goal 08/13/2013  Blood pressure at goal    Self Care Goals & Plans:  Self Care Goal 01/27/2014  Manage my medications take my medicines as prescribed; bring my medications to every visit; refill my medications on time  Monitor my health -  Eat healthy foods eat more vegetables; eat foods that are low in salt; eat baked foods instead of fried foods  Be physically active -    No flowsheet data found.   Care Management & Community Referrals:  Referral 08/13/2013  Referrals made for care management support none needed  Referrals made to community resources none

## 2014-08-10 NOTE — Assessment & Plan Note (Signed)
Patient states her low back pain is controlled with Flexeril 10 mg daily PRN and Gabapentin 300 mg TID. She also uses Biogel topical which alleviates her pain. No changes in symptoms or warning signs.  - Continue current medications

## 2014-08-10 NOTE — Assessment & Plan Note (Signed)
-   Pap smear performed today. No concerning findings on exam. Will f/u pap smear cytology results.

## 2014-08-10 NOTE — Assessment & Plan Note (Signed)
Patient has quit using tobacco for several years now. She was commended for her continued cessation.

## 2014-08-10 NOTE — Assessment & Plan Note (Signed)
Patient denies pain/pressure in her sinuses and rhinorrhea. Well controlled with Claritin. - Continue daily Claritin.

## 2014-08-10 NOTE — Assessment & Plan Note (Signed)
Patient without symptoms of headache and neck/jaw pain currently. She still notes right sided paresthesias in her arm. Neurology suggested to consider MRI brain to rule out secondary causes if pain continues. She is to follow up with her established neurologist [facility]- Marzetta BoardJane Keilitz, PA). She was instructed to stop ibuprofen due to her elevated Cr.  - Follow up with neurology - Continue Nortriptyline and Gabapentin - Check bmet to assess Cr today

## 2014-08-11 ENCOUNTER — Telehealth: Payer: Self-pay | Admitting: Licensed Clinical Social Worker

## 2014-08-11 ENCOUNTER — Other Ambulatory Visit: Payer: Self-pay | Admitting: Internal Medicine

## 2014-08-11 DIAGNOSIS — Z1231 Encounter for screening mammogram for malignant neoplasm of breast: Secondary | ICD-10-CM

## 2014-08-11 LAB — CYTOLOGY - PAP

## 2014-08-11 NOTE — Progress Notes (Signed)
INTERNAL MEDICINE TEACHING ATTENDING ADDENDUM - Miranda Carreon, MD: I reviewed and discussed at the time of visit with the resident Dr. Rivet, the patient's medical history, physical examination, diagnosis and results of pertinent tests and treatment and I agree with the patient's care as documented.  

## 2014-08-11 NOTE — Telephone Encounter (Signed)
Miranda Gaines in need of referral to new psychiatrist.  Pt has medicare/medicaid and [facility].  Pt states as of late [facility] needed to resche[facility] her appointments x2 after she has arrived.  Miranda Gaines states she has a power wheelchair and takes several hours to get ready in the morning, then transported to appointments only to be told the provider no-showed.  Pt called [facility] to be assigned to another provider but could not get in until July.  Miranda Gaines states she wants a provider that will "show up to work".  CSW informed Miranda Gaines of agencies that are in-network for [employer].  Once CSW confirms medicare is accepted, referral will be made.  [name] in agreement for CSW to schedule appointment if possible.

## 2014-08-14 ENCOUNTER — Telehealth: Payer: Self-pay | Admitting: Internal Medicine

## 2014-08-14 NOTE — Telephone Encounter (Signed)
Called patient with lab results and answered all questions.

## 2014-08-21 ENCOUNTER — Other Ambulatory Visit: Payer: Self-pay | Admitting: Internal Medicine

## 2014-08-31 NOTE — Telephone Encounter (Signed)
08/26/14 - [facility]. 08/31/14 - Call placed to [name] to confirm fax received and if pt had been accepted/scheduled.  Front office did not see pt scheduled and would have intake coordinator, Ms. Revonda Standardllison return call to CSW.

## 2014-09-01 NOTE — Telephone Encounter (Signed)
[facility] currently verifying insurance and will contact CSW for scheduling.

## 2014-09-02 ENCOUNTER — Ambulatory Visit (HOSPITAL_COMMUNITY)
Admission: RE | Admit: 2014-09-02 | Discharge: 2014-09-02 | Disposition: A | Discharge status: Home / Self Care | Payer: Medicare Other | Source: Ambulatory Visit | Attending: Internal Medicine | Admitting: Internal Medicine

## 2014-09-02 ENCOUNTER — Encounter: Payer: Self-pay | Admitting: Licensed Clinical Social Worker

## 2014-09-02 DIAGNOSIS — Z1231 Encounter for screening mammogram for malignant neoplasm of breast: Secondary | ICD-10-CM

## 2014-09-17 ENCOUNTER — Other Ambulatory Visit: Payer: Self-pay | Admitting: Internal Medicine

## 2014-09-22 DIAGNOSIS — F332 Major depressive disorder, recurrent severe without psychotic features: Secondary | ICD-10-CM | POA: Diagnosis not present

## 2014-09-24 ENCOUNTER — Encounter: Payer: Self-pay | Admitting: Internal Medicine

## 2014-09-24 ENCOUNTER — Ambulatory Visit (INDEPENDENT_AMBULATORY_CARE_PROVIDER_SITE_OTHER): Payer: Medicare Other | Admitting: Internal Medicine

## 2014-09-24 VITALS — BP 127/73 | HR 81 | Temp 97.7°F | Ht 62.0 in

## 2014-09-24 DIAGNOSIS — J209 Acute bronchitis, unspecified: Secondary | ICD-10-CM

## 2014-09-24 DIAGNOSIS — I10 Essential (primary) hypertension: Secondary | ICD-10-CM

## 2014-09-24 DIAGNOSIS — B9689 Other specified bacterial agents as the cause of diseases classified elsewhere: Secondary | ICD-10-CM

## 2014-09-24 MED ORDER — GUAIFENESIN 100 MG/5ML PO SOLN: 5.0000 mL | ORAL | Status: DC | PRN

## 2014-09-24 MED ORDER — AZITHROMYCIN 250 MG PO TABS: ORAL_TABLET | ORAL | Status: AC

## 2014-09-24 NOTE — Progress Notes (Signed)
Patient ID: Miranda Gaines, female   DOB: 1955/10/03, 59 y.o.   MRN: [id number]   Subjective:   HPI: Miranda Gaines is a 59 y.o. woman with past medical history below presents for an acute visit due to persistent cough.   Reason(s) for visit:  Cough: Patient reports 10 days history of persistent cough associated with some chest pain with cough. She has no shortness of breath. However, she is unable to sleep at night due to cough. She believes she has been having subjective fevers and chills. Cough is productive of white and sometimes yellow sputum, but she has not seen any hemoptysis. She states that she might have been in contact with people with similar symptoms at her church. She has not been able to get out much. Due to symptoms and she's been using Alka-Seltzer without much improvement for cough. She states that she recently was told that her blood pressure was elevated last week and she was encouraged to see a doctor to make sure that her cough supplements are not raising her blood pressure.    Past Medical History  Diagnosis Date  . Hyperlipidemia   . Hypertension   . GERD (gastroesophageal reflux disease)   . Depression     [facility]  . Degenerative disc disease, cervical     L spine 10/09 showing DJD and cervical spurring but no specific foraminal narrowing. Has been followed by Rochele PagesJanie Kelly, pain management physician in [place].  . Pyloric stenosis     s/p EGD with balloon dilatation on 06/23/2007 by Dr. Elnoria HowardHung  . Tobacco abuse     Did not tolerate Chantix (vomiting)  . Wears hearing aid     In r. ear, followed by ENT. 2/2 recurrent otitis  . Seborrheic dermatitis     of face, sees Dr. Merlyn AlbertFred Luptom, dermatop cream TID  . Herniated disc     L5-S1. s/p laminectomy in 1993 by Dr. Shelle IronBeane.  MRI June 2005 showing L4/5 disc bulge + mild facet arthropathy. F/b Dr. Marzetta BoardJane Keilitz for pain management. Wheel chair bound.    ROS: Constitutional:  Denies fevers,  chills, diaphoresis, appetite change and fatigue.  Respiratory: no wheezing.  CVS: No chest pain, palpitations and leg swelling.  GI: No abdominal pain, nausea, vomiting, bloody stools GU: No dysuria, frequency, hematuria, or flank pain.  MSK: No myalgias, back pain, joint swelling, arthralgias  Psych: No depression symptoms. No SI or SA.    Objective:  Physical Exam: Filed Vitals:   09/24/14 0917  BP: 127/73  Pulse: 81  Temp: 97.7 F (36.5 C)  TempSrc: Oral  Height: 5\' 2"  (1.575 m)  SpO2: 100%   General: Well nourished. No acute distress. WC bound. Hearing impairment.   HEENT: Normal oral mucosa. MMM. Oropharynx is clear.   Lungs: CTA bilaterally. No wheezing. No labored breathing. Heart: RRR; no extra sounds or murmurs  Abdomen: Non-distended, normal bowel sounds, soft, nontender; no hepatosplenomegaly  Extremities: No pedal edema. No joint swelling or tenderness. Neurologic: Normal EOM,  Alert and oriented x3. No obvious neurologic/cranial nerve deficits.  Assessment & Plan:  Discussed case with Dr Cyndie ChimeGranfortuna. See problem based charting for assessment and plan.

## 2014-09-24 NOTE — Assessment & Plan Note (Signed)
BP stable. Continue with current meds

## 2014-09-24 NOTE — Assessment & Plan Note (Signed)
Assessment: Most likely diagnosis acute viral bronchitis which has persisted for more than 10 days. She does have subjective fevers and chills, but she is afebrile here in the office. Her exam does not reveal labored breathing. Chest auscultation is clear. I do not suspect pneumonia. However, secondary bacterial upper respiratory tract infection cannot be excluded.   Plan: 1. Labs/imaging: none 2. Therapy: Z-Pak with azithromycin 500 mg on day 1 and 250 mg once daily for 2 days. Recommended Robitussin-DM. 3. Follow up: Given her information on when to return > if she develops fevers, shortness of breath, or persistent symptoms after completing her treatment.

## 2014-09-24 NOTE — Patient Instructions (Addendum)
General Instructions: Please take Z-pack for your cough Please Alka-seltzer  Please take Robisussin DM for your cough  Please come back if your cough does not improve after a few days of taking the medication  Please bring your medicines with you each time you come to clinic.  Medicines may include prescription medications, over-the-counter medications, herbal remedies, eye drops, vitamins, or other pills.   Progress Toward Treatment Goals:  Treatment Goal 08/10/2014  Blood pressure deteriorated    Self Care Goals & Plans:  Self Care Goal 09/24/2014  Manage my medications take my medicines as prescribed; bring my medications to every visit; refill my medications on time; follow the sick day instructions if I am sick  Monitor my health -  Eat healthy foods eat more vegetables; eat fruit for snacks and desserts; eat smaller portions; eat baked foods instead of fried foods  Be physically active find an activity I enjoy    No flowsheet data found.   Care Management & Community Referrals:  Referral 08/13/2013  Referrals made for care management support none needed  Referrals made to community resources none     Acute Bronchitis Bronchitis is when the airways that extend from the windpipe into the lungs get red, puffy, and painful (inflamed). Bronchitis often causes thick spit (mucus) to develop. This leads to a cough. A cough is the most common symptom of bronchitis. In acute bronchitis, the condition usually begins suddenly and goes away over time (usually in 2 weeks). Smoking, allergies, and asthma can make bronchitis worse. Repeated episodes of bronchitis may cause more lung problems. HOME CARE  Rest.  Drink enough fluids to keep your pee (urine) clear or pale yellow (unless you need to limit fluids as told by your doctor).  Only take over-the-counter or prescription medicines as told by your doctor.  Avoid smoking and secondhand smoke. These can make bronchitis worse. If you  are a smoker, think about using nicotine gum or skin patches. Quitting smoking will help your lungs heal faster.  Reduce the chance of getting bronchitis again by:  Washing your hands often.  Avoiding people with cold symptoms.  Trying not to touch your hands to your mouth, nose, or eyes.  Follow up with your doctor as told. GET HELP IF: Your symptoms do not improve after 1 week of treatment. Symptoms include:  Cough.  Fever.  Coughing up thick spit.  Body aches.  Chest congestion.  Chills.  Shortness of breath.  Sore throat. GET HELP RIGHT AWAY IF:   You have an increased fever.  You have chills.  You have severe shortness of breath.  You have bloody thick spit (sputum).  You throw up (vomit) often.  You lose too much body fluid (dehydration).  You have a severe headache.  You faint. MAKE SURE YOU:   Understand these instructions.  Will watch your condition.  Will get help right away if you are not doing well or get worse. Document Released: 09/27/2007 Document Revised: 12/11/2012 Document Reviewed: 10/01/2012 [facility] Patient Information 2015 [employer] [place]. This information is not intended to replace advice given to you by your health care provider. Make sure you discuss any questions you have with your health care provider.

## 2014-09-24 NOTE — Progress Notes (Signed)
Medicine attending: Medical history, presenting problems, physical findings, and medications, reviewed with Dr Richard Kazibwe and I concur with his evaluation and management plan. 

## 2014-10-20 ENCOUNTER — Other Ambulatory Visit: Payer: Self-pay | Admitting: Internal Medicine

## 2014-11-18 ENCOUNTER — Other Ambulatory Visit: Payer: Self-pay | Admitting: Internal Medicine

## 2014-12-23 ENCOUNTER — Other Ambulatory Visit: Payer: Self-pay | Admitting: Internal Medicine

## 2014-12-29 DIAGNOSIS — F332 Major depressive disorder, recurrent severe without psychotic features: Secondary | ICD-10-CM | POA: Diagnosis not present

## 2015-01-19 ENCOUNTER — Other Ambulatory Visit: Payer: Self-pay | Admitting: Internal Medicine

## 2015-01-21 ENCOUNTER — Other Ambulatory Visit: Payer: Self-pay | Admitting: Internal Medicine

## 2015-02-09 ENCOUNTER — Ambulatory Visit (INDEPENDENT_AMBULATORY_CARE_PROVIDER_SITE_OTHER): Payer: Medicare Other | Admitting: Internal Medicine

## 2015-02-09 VITALS — BP 135/77 | HR 70 | Temp 98.1°F | Wt 174.4 lb

## 2015-02-09 DIAGNOSIS — M545 Low back pain: Secondary | ICD-10-CM

## 2015-02-09 DIAGNOSIS — M25562 Pain in left knee: Secondary | ICD-10-CM | POA: Diagnosis not present

## 2015-02-09 DIAGNOSIS — F329 Major depressive disorder, single episode, unspecified: Secondary | ICD-10-CM

## 2015-02-09 DIAGNOSIS — G8929 Other chronic pain: Secondary | ICD-10-CM

## 2015-02-09 DIAGNOSIS — M25561 Pain in right knee: Secondary | ICD-10-CM | POA: Diagnosis not present

## 2015-02-09 DIAGNOSIS — Z0000 Encounter for general adult medical examination without abnormal findings: Secondary | ICD-10-CM

## 2015-02-09 DIAGNOSIS — F32A Depression, unspecified: Secondary | ICD-10-CM

## 2015-02-09 DIAGNOSIS — I10 Essential (primary) hypertension: Secondary | ICD-10-CM

## 2015-02-09 DIAGNOSIS — L219 Seborrheic dermatitis, unspecified: Secondary | ICD-10-CM | POA: Diagnosis not present

## 2015-02-09 DIAGNOSIS — Z23 Encounter for immunization: Secondary | ICD-10-CM | POA: Diagnosis present

## 2015-02-09 MED ORDER — SELENIUM SULFIDE 2.25 % EX SHAM: 1.0000 "application " | MEDICATED_SHAMPOO | Freq: Every day | CUTANEOUS | Status: DC

## 2015-02-09 NOTE — Patient Instructions (Signed)
-   Stop using ketaconazole shampoo - Start using Selenium sulfide shampoo - Continue hydrocortisone cream for face - Continue Amlodipine 10 mg daily- your blood pressure is doing great!  General Instructions:   Please bring your medicines with you each time you come to clinic.  Medicines may include prescription medications, over-the-counter medications, herbal remedies, eye drops, vitamins, or other pills.   Progress Toward Treatment Goals:  Treatment Goal 08/10/2014  Blood pressure deteriorated    Self Care Goals & Plans:  Self Care Goal 02/09/2015  Manage my medications take my medicines as prescribed; bring my medications to every visit; refill my medications on time  Monitor my health keep track of my blood pressure  Eat healthy foods eat more vegetables; eat foods that are low in salt; eat baked foods instead of fried foods  Be physically active find an activity I enjoy    No flowsheet data found.   Care Management & Community Referrals:  Referral 08/13/2013  Referrals made for care management support none needed  Referrals made to community resources none

## 2015-02-11 ENCOUNTER — Encounter: Payer: Self-pay | Admitting: Internal Medicine

## 2015-02-11 NOTE — Assessment & Plan Note (Signed)
Continue Nortriptyline and Trazodone per mental health

## 2015-02-11 NOTE — Progress Notes (Signed)
   Subjective:    Patient ID: Miranda Gaines, female    DOB: 01-Jul-1955, 59 y.o.   MRN: [id number]  HPI Miranda Gaines is a 59yo woman with PMHx of HTN, depression, hyperlipidemia, and chronic low back pain and bilateral knee pain who presents today for follow up of her chronic medical problems as listed below.  HTN: BP is well controlled at 135/77 today. She is on Amlodipine 10 mg daily.  Chronic Knee Pain: Pain is stable. Patient reports pain is well controlled by using topical Biogel.  Chronic Low Back Pain: Patient denies worsening of her chronic back pain. She takes Gabapentin 300 mg TID and Flexeril 10 mg daily PRN which controls her pain.   Depression: Patient reports symptoms are stable on Nortriptyline 75 mg QHS and Trazodone 50 mg QHS. [facility] She had requested referral to a different psychiatric practice last visit, but [facility]   Seborrheic Dermatitis: Patient reports this has worsened on her scalp. She is currently using ketoconazole shampoo and has tried clindamycin gel with minimal improvement. She is interested in trying a different regimen. She reports the low potency hydrocortisone cream she uses on her face works well.    Review of Systems General: Denies fever, chills, night sweats, changes in weight, changes in appetite HEENT: Denies headaches, ear pain, changes in vision, rhinorrhea, sore throat CV: Denies CP, palpitations, SOB, orthopnea Pulm: Denies SOB, cough, wheezing GI: Denies abdominal pain, nausea, vomiting, diarrhea, constipation, melena, hematochezia GU: Denies dysuria, hematuria, frequency Msk: Reports bilateral knee pain and low back pain-chronic. Denies muscle cramps Neuro: Denies weakness, numbness, tingling Skin: Denies bruising Psych: Reports depression-well controlled. Denies anxiety, hallucinations    Objective:   Physical Exam General: alert, sitting up in chair, NAD HEENT: /AT, EOMI, sclera  anicteric, mucus membranes moist CV: RRR, no m/g/r Pulm: CTA bilaterally, breaths non-labored Ext: warm, no peripheral edema Neuro: alert and oriented x 3 Skin: scaly, flaky skin on occipital region of scalp, minimal erythema. Face has whitish pimple-like lesions.     Assessment & Plan:  Please refer to A&P documentation.

## 2015-02-11 NOTE — Assessment & Plan Note (Signed)
BP Readings from Last 3 Encounters:  02/09/15 135/77  09/24/14 127/73  08/10/14 154/82    Lab Results  Component Value Date   NA 140 08/10/2014   K 4.2 08/10/2014   CREATININE 0.97 08/10/2014    Assessment: Blood pressure control:  Well controlled.   Plan: Medications:  Continue Amlodipine 10 mg daily

## 2015-02-11 NOTE — Assessment & Plan Note (Signed)
Well controlled with Biogel. Continue regimen.

## 2015-02-11 NOTE — Assessment & Plan Note (Signed)
-   Will try selenium sulfate shampoo for scalp - Continue hydrocortisone cream for face

## 2015-02-11 NOTE — Assessment & Plan Note (Signed)
Flu shot today 

## 2015-02-11 NOTE — Assessment & Plan Note (Signed)
Well controlled with Gabapentin 300 mg TID and Flexeril 10 mg daily PRN

## 2015-02-12 NOTE — Progress Notes (Signed)
Internal Medicine Clinic Attending  Case discussed with Dr. Rivet soon after the resident saw the patient.  We reviewed the resident's history and exam and pertinent patient test results.  I agree with the assessment, diagnosis, and plan of care documented in the resident's note.  

## 2015-02-23 ENCOUNTER — Other Ambulatory Visit: Payer: Self-pay | Admitting: Internal Medicine

## 2015-03-16 DIAGNOSIS — F332 Major depressive disorder, recurrent severe without psychotic features: Secondary | ICD-10-CM | POA: Diagnosis not present

## 2015-03-23 ENCOUNTER — Other Ambulatory Visit: Payer: Self-pay | Admitting: Internal Medicine

## 2015-04-06 DIAGNOSIS — M961 Postlaminectomy syndrome, not elsewhere classified: Secondary | ICD-10-CM | POA: Diagnosis not present

## 2015-04-06 DIAGNOSIS — M5030 Other cervical disc degeneration, unspecified cervical region: Secondary | ICD-10-CM | POA: Diagnosis not present

## 2015-04-06 DIAGNOSIS — M5416 Radiculopathy, lumbar region: Secondary | ICD-10-CM | POA: Diagnosis not present

## 2015-04-06 DIAGNOSIS — M5412 Radiculopathy, cervical region: Secondary | ICD-10-CM | POA: Diagnosis not present

## 2015-04-27 ENCOUNTER — Other Ambulatory Visit: Payer: Self-pay | Admitting: Internal Medicine

## 2015-05-28 ENCOUNTER — Other Ambulatory Visit: Payer: Self-pay | Admitting: Internal Medicine

## 2015-06-07 DIAGNOSIS — F332 Major depressive disorder, recurrent severe without psychotic features: Secondary | ICD-10-CM | POA: Diagnosis not present

## 2015-06-21 ENCOUNTER — Emergency Department (HOSPITAL_COMMUNITY): Payer: Medicare Other

## 2015-06-21 ENCOUNTER — Emergency Department (HOSPITAL_COMMUNITY)
Admission: EM | Admit: 2015-06-21 | Discharge: 2015-06-21 | Disposition: A | Discharge status: Home / Self Care | Payer: Medicare Other | Attending: Emergency Medicine | Admitting: Emergency Medicine

## 2015-06-21 ENCOUNTER — Encounter (HOSPITAL_COMMUNITY): Payer: Self-pay | Admitting: Emergency Medicine

## 2015-06-21 DIAGNOSIS — R1031 Right lower quadrant pain: Secondary | ICD-10-CM | POA: Diagnosis not present

## 2015-06-21 DIAGNOSIS — R339 Retention of urine, unspecified: Secondary | ICD-10-CM | POA: Diagnosis not present

## 2015-06-21 DIAGNOSIS — Z87891 Personal history of nicotine dependence: Secondary | ICD-10-CM | POA: Insufficient documentation

## 2015-06-21 DIAGNOSIS — Z880 Allergy status to penicillin: Secondary | ICD-10-CM | POA: Diagnosis not present

## 2015-06-21 DIAGNOSIS — K219 Gastro-esophageal reflux disease without esophagitis: Secondary | ICD-10-CM | POA: Diagnosis not present

## 2015-06-21 DIAGNOSIS — R1030 Lower abdominal pain, unspecified: Secondary | ICD-10-CM | POA: Diagnosis not present

## 2015-06-21 DIAGNOSIS — E785 Hyperlipidemia, unspecified: Secondary | ICD-10-CM | POA: Insufficient documentation

## 2015-06-21 DIAGNOSIS — I10 Essential (primary) hypertension: Secondary | ICD-10-CM | POA: Diagnosis not present

## 2015-06-21 DIAGNOSIS — R030 Elevated blood-pressure reading, without diagnosis of hypertension: Secondary | ICD-10-CM | POA: Diagnosis not present

## 2015-06-21 DIAGNOSIS — Z79899 Other long term (current) drug therapy: Secondary | ICD-10-CM | POA: Diagnosis not present

## 2015-06-21 DIAGNOSIS — R300 Dysuria: Secondary | ICD-10-CM

## 2015-06-21 LAB — URINALYSIS, ROUTINE W REFLEX MICROSCOPIC
Bilirubin Urine: NEGATIVE
Glucose, UA: NEGATIVE mg/dL
Ketones, ur: NEGATIVE mg/dL
Nitrite: NEGATIVE
Protein, ur: NEGATIVE mg/dL
Specific Gravity, Urine: 1.003 — ABNORMAL LOW (ref 1.005–1.030)
pH: 6 (ref 5.0–8.0)

## 2015-06-21 LAB — BASIC METABOLIC PANEL
Anion gap: 11 (ref 5–15)
BUN: <5 mg/dL — ABNORMAL LOW (ref 6–20)
CO2: 23 mmol/L (ref 22–32)
Calcium: 9.4 mg/dL (ref 8.9–10.3)
Chloride: 107 mmol/L (ref 101–111)
Creatinine, Ser: 0.93 mg/dL (ref 0.44–1.00)
GFR calc Af Amer: >60 mL/min (ref >60)
GFR calc non Af Amer: >60 mL/min (ref >60)
Glucose, Bld: 94 mg/dL (ref 65–99)
Potassium: 3.3 mmol/L — ABNORMAL LOW (ref 3.5–5.1)
Sodium: 141 mmol/L (ref 135–145)

## 2015-06-21 LAB — CBC WITH DIFFERENTIAL/PLATELET
Basophils Absolute: 0 10*3/uL (ref 0.0–0.1)
Basophils Relative: 0 %
Eosinophils Absolute: 0.2 10*3/uL (ref 0.0–0.7)
Eosinophils Relative: 2 %
HCT: 37.3 % (ref 36.0–46.0)
Hemoglobin: 12.4 g/dL (ref 12.0–15.0)
Lymphocytes Relative: 32 %
Lymphs Abs: 2.5 10*3/uL (ref 0.7–4.0)
MCH: 29.3 pg (ref 26.0–34.0)
MCHC: 33.2 g/dL (ref 30.0–36.0)
MCV: 88.2 fL (ref 78.0–100.0)
Monocytes Absolute: 0.6 10*3/uL (ref 0.1–1.0)
Monocytes Relative: 8 %
Neutro Abs: 4.4 10*3/uL (ref 1.7–7.7)
Neutrophils Relative %: 58 %
Platelets: 180 10*3/uL (ref 150–400)
RBC: 4.23 MIL/uL (ref 3.87–5.11)
RDW: 13.9 % (ref 11.5–15.5)
WBC: 7.6 10*3/uL (ref 4.0–10.5)

## 2015-06-21 LAB — URINE MICROSCOPIC-ADD ON

## 2015-06-21 MED ORDER — CIPROFLOXACIN HCL 500 MG PO TABS
500.0000 mg | ORAL_TABLET | Freq: Once | ORAL | Status: AC
  Administered 2015-06-21: 500 mg via ORAL
  Filled 2015-06-21: qty 1

## 2015-06-21 MED ORDER — SODIUM CHLORIDE 0.9 % IV BOLUS (SEPSIS)
250.0000 mL | Freq: Once | INTRAVENOUS | Status: AC
  Administered 2015-06-21: 250 mL via INTRAVENOUS

## 2015-06-21 MED ORDER — IOHEXOL 300 MG/ML  SOLN
100.0000 mL | Freq: Once | INTRAMUSCULAR | Status: AC | PRN
  Administered 2015-06-21: 100 mL via INTRAVENOUS

## 2015-06-21 MED ORDER — FENTANYL CITRATE (PF) 100 MCG/2ML IJ SOLN
50.0000 ug | Freq: Once | INTRAMUSCULAR | Status: AC
  Administered 2015-06-21: 50 ug via INTRAVENOUS
  Filled 2015-06-21: qty 2

## 2015-06-21 MED ORDER — CIPROFLOXACIN HCL 500 MG PO TABS: 500.0000 mg | ORAL_TABLET | Freq: Two times a day (BID) | ORAL | Status: DC

## 2015-06-21 MED ORDER — HYDROMORPHONE HCL 4 MG PO TABS: 4.0000 mg | ORAL_TABLET | Freq: Four times a day (QID) | ORAL | Status: DC | PRN

## 2015-06-21 MED ORDER — ONDANSETRON HCL 4 MG/2ML IJ SOLN
4.0000 mg | Freq: Once | INTRAMUSCULAR | Status: AC
  Administered 2015-06-21: 4 mg via INTRAVENOUS
  Filled 2015-06-21: qty 2

## 2015-06-21 MED ORDER — SODIUM CHLORIDE 0.9 % IV SOLN
INTRAVENOUS | Status: DC
  Administered 2015-06-21: 14:00:00 via INTRAVENOUS

## 2015-06-21 NOTE — ED Notes (Signed)
MD made aware staff unable to locate bladder scanner.

## 2015-06-21 NOTE — Discharge Instructions (Signed)
CAT scan of the abdomen without any acute findings. Labs without any significant abnormalities. Urinalysis question of a urinary tract infection. Will treat with antibiotic Cipro. Urine culture is pending to confirm whether his urinary tract infection or not. Take the hydromorphone as needed for pain. Make an appoint to follow-up with your doctor. Return for any newer worse symptoms.

## 2015-06-21 NOTE — ED Notes (Addendum)
Patient able to void during assessment. Patient complains of burning and pain with urination.

## 2015-06-21 NOTE — ED Provider Notes (Addendum)
CSN: [id number]     Arrival date & time 06/21/15  1125 History   First MD Initiated Contact with Patient 06/21/15 1144     Chief Complaint  Patient presents with  . Urinary Retention     (Consider location/radiation/quality/duration/timing/severity/associated sxs/prior Treatment) The history is provided by the patient and the EMS personnel.   60 year old female brought in by EMS. Patient coming from home states she's had urinary retention problems for the past 6 days. Also said burning when she does urinate. Patient states that she feels like she has urinate frequently. Patient was able to urinate and the EMS truck and stated that it was painful and that she still feels pressure and burning. Patient denies any fevers nausea or vomiting. No back pain. However is associated with suprapubic and right lower quadrant abdominal pain that she states has been present for approximately a week and constant in nature.  Past Medical History  Diagnosis Date  . Hyperlipidemia   . Hypertension   . GERD (gastroesophageal reflux disease)   . Depression     f/b Dr. Cardell Peach at [facility]  . Degenerative disc disease, cervical     L spine 10/09 showing DJD and cervical spurring but no specific foraminal narrowing. Has been followed by Rochele Pages, pain management physician in [place].  . Pyloric stenosis     s/p EGD with balloon dilatation on 06/23/2007 by Dr. Elnoria Howard  . Tobacco abuse     Did not tolerate Chantix (vomiting)  . Wears hearing aid     In r. ear, followed by ENT. 2/2 recurrent otitis  . Seborrheic dermatitis     of face, sees Dr. Merlyn Albert Luptom, dermatop cream TID  . Herniated disc     L5-S1. s/p laminectomy in 1993 by Dr. Shelle Iron.  MRI June 2005 showing L4/5 disc bulge + mild facet arthropathy. F/b Dr. Marzetta Board for pain management. Wheel chair bound.   Past Surgical History  Procedure Laterality Date  . Lumbar laminectomy  1993    Dr. Leandra Kern  . Egd w. balllon dilation for  pyloric stenosis  2009    Dr. Elnoria Howard   Family History  Problem Relation Age of Onset  . Coronary artery disease Mother     deceased at 61  . Cancer Father     deceased at 46, lung cancer   Social History  Substance Use Topics  . Smoking status: Former Smoker    Quit date: 02/10/2014  . Smokeless tobacco: Former Neurosurgeon     Comment: quit at 2012  . Alcohol Use: No   OB History    No data available     Review of Systems  Constitutional: Negative for fever.  HENT: Negative for congestion.   Eyes: Negative for visual disturbance.  Respiratory: Negative for shortness of breath.   Cardiovascular: Negative for chest pain.  Gastrointestinal: Positive for abdominal pain. Negative for nausea and vomiting.  Genitourinary: Positive for dysuria and difficulty urinating. Negative for flank pain.  Musculoskeletal: Negative for back pain.  Skin: Negative for rash.  Neurological: Negative for headaches.  Hematological: Does not bruise/bleed easily.  Psychiatric/Behavioral: Negative for confusion.      Allergies  Codeine; Ibuprofen; Maxzide; Morphine; Oxycodone hcl; and Penicillins  Home Medications   Prior to Admission medications   Medication Sig Start Date End Date Taking? Authorizing Provider  amLODipine (NORVASC) 10 MG tablet TAKE 1 TABLET BY MOUTH EVERY DAY 01/19/15  Yes Carly J Rivet, MD  atorvastatin (LIPITOR) 80 MG  tablet TAKE 1 TABLET BY MOUTH ONCE DAILY AT 6PM 03/25/15  Yes Carly Arlyce Harman, MD  cyclobenzaprine (FLEXERIL) 10 MG tablet Take 10 mg by mouth 3 (three) times daily as needed. Muscle spasms 03/24/15  Yes Historical Provider, MD  diclofenac sodium (VOLTAREN) 1 % GEL Apply 4 g topically 4 (four) times daily. 03/24/15  Yes Historical Provider, MD  gabapentin (NEURONTIN) 300 MG capsule Take 300 mg by mouth 3 (three) times daily.   Yes Historical Provider, MD  hydrocortisone butyrate (LUCOID) 0.1 % CREA cream APPLY TO AFFECTED AREA EVERY DAY 03/25/15  Yes Carly J Rivet, MD   loratadine (CLARITIN) 10 MG tablet TAKE 1 TABLET BY MOUTH EVERY DAY 12/24/14  Yes Carly J Rivet, MD  NEXIUM 40 MG capsule TAKE 1 CAPSULE BY MOUTH ONCE DAILY BEFORE BREAKFAST 06/02/15  Yes Carly J Rivet, MD  nortriptyline (PAMELOR) 75 MG capsule Take 75 mg by mouth at bedtime.   Yes Historical Provider, MD  polyethylene glycol powder (GLYCOLAX/MIRALAX) powder MIX 1 CAPFUL (17GM) IN A GLASS OF LIQUID AND DRINK EVERY DAY 04/28/15  Yes Carly J Rivet, MD  pregabalin (LYRICA) 150 MG capsule Take 150 mg by mouth 3 (three) times daily. 04/06/15  Yes Historical Provider, MD  traZODone (DESYREL) 50 MG tablet Take 50 mg by mouth at bedtime. Prescribed by Mental Health.    Yes Historical Provider, MD  CIPRODEX otic suspension INSTILL 4 DROPS INTO BOTH EARS TWICE DAILY FOR 7 DAYS Patient not taking: Reported on 02/09/2015 03/10/14   Iris Pert Rivet, MD  ciprofloxacin (CIPRO) 500 MG tablet Take 1 tablet (500 mg total) by mouth 2 (two) times daily. 06/21/15   Vanetta Mulders, MD  clindamycin (CLINDAGEL) 1 % gel APPLY TOPICALLY TWICE DAILY Patient not taking: Reported on 06/21/2015 02/24/15   Su Hoff, MD  HYDROmorphone (DILAUDID) 4 MG tablet Take 1 tablet (4 mg total) by mouth every 6 (six) hours as needed for severe pain. 06/21/15   Vanetta Mulders, MD  Menthol, Topical Analgesic, 4 % GEL Apply 1 application topically 2 (two) times daily. Patient not taking: Reported on 06/21/2015 08/10/14   Iris Pert Rivet, MD  Selenium Sulfide 2.25 % SHAM Apply 1 application topically daily. Patient not taking: Reported on 06/21/2015 02/09/15   Iris Pert Rivet, MD  sodium chloride (OCEAN NASAL SPRAY) 0.65 % nasal spray Place 1 spray into the nose as needed for congestion. 05/28/12 05/06/14  Lorretta Harp, MD   BP 111/65 mmHg  Pulse 78  Temp(Src) 98.8 F (37.1 C) (Oral)  Resp 16  SpO2 98% Physical Exam  Constitutional: She is oriented to person, place, and time. She appears well-developed and well-nourished. No distress.  HENT:  Head:  Normocephalic and atraumatic.  Mouth/Throat: Oropharynx is clear and moist.  Eyes: Conjunctivae and EOM are normal. Pupils are equal, round, and reactive to light.  Neck: Normal range of motion. Neck supple.  Cardiovascular: Normal rate and regular rhythm.   No murmur heard. Pulmonary/Chest: Effort normal and breath sounds normal. No respiratory distress.  Abdominal: Soft. Bowel sounds are normal. There is tenderness.  Tenderness to palpation suprapubic right lower quadrant without guarding.  Musculoskeletal: Normal range of motion. She exhibits no edema.  Neurological: She is alert and oriented to person, place, and time. No cranial nerve deficit. She exhibits normal muscle tone. Coordination normal.  Skin: Skin is warm. No rash noted.  Nursing note and vitals reviewed.   ED Course  Procedures (including critical care time) Labs Review Labs Reviewed  URINALYSIS, ROUTINE W REFLEX MICROSCOPIC (NOT AT [facility]) - Abnormal; Notable for the following:    Color, Urine STRAW (*)    Specific Gravity, Urine 1.003 (*)    Hgb urine dipstick MODERATE (*)    Leukocytes, UA LARGE (*)    All other components within normal limits  BASIC METABOLIC PANEL - Abnormal; Notable for the following:    Potassium 3.3 (*)    BUN <5 (*)    All other components within normal limits  URINE MICROSCOPIC-ADD ON - Abnormal; Notable for the following:    Squamous Epithelial / LPF 0-5 (*)    Bacteria, UA RARE (*)    All other components within normal limits  URINE CULTURE  CBC WITH DIFFERENTIAL/PLATELET   Results for orders placed or performed during the hospital encounter of 06/21/15  Urinalysis, Routine w reflex microscopic (not at [facility])  Result Value Ref Range   Color, Urine STRAW (A) YELLOW   APPearance CLEAR CLEAR   Specific Gravity, Urine 1.003 (L) 1.005 - 1.030   pH 6.0 5.0 - 8.0   Glucose, UA NEGATIVE NEGATIVE mg/dL   Hgb urine dipstick MODERATE (A) NEGATIVE   Bilirubin Urine NEGATIVE NEGATIVE    Ketones, ur NEGATIVE NEGATIVE mg/dL   Protein, ur NEGATIVE NEGATIVE mg/dL   Nitrite NEGATIVE NEGATIVE   Leukocytes, UA LARGE (A) NEGATIVE  Basic metabolic panel  Result Value Ref Range   Sodium 141 135 - 145 mmol/L   Potassium 3.3 (L) 3.5 - 5.1 mmol/L   Chloride 107 101 - 111 mmol/L   CO2 23 22 - 32 mmol/L   Glucose, Bld 94 65 - 99 mg/dL   BUN <5 (L) 6 - 20 mg/dL   Creatinine, Ser 1.61 0.44 - 1.00 mg/dL   Calcium 9.4 8.9 - 09.6 mg/dL   GFR calc non Af Amer >60 >60 mL/min   GFR calc Af Amer >60 >60 mL/min   Anion gap 11 5 - 15  CBC with Differential/Platelet  Result Value Ref Range   WBC 7.6 4.0 - 10.5 K/uL   RBC 4.23 3.87 - 5.11 MIL/uL   Hemoglobin 12.4 12.0 - 15.0 g/dL   HCT 04.5 40.9 - 81.1 %   MCV 88.2 78.0 - 100.0 fL   MCH 29.3 26.0 - 34.0 pg   MCHC 33.2 30.0 - 36.0 g/dL   RDW 91.4 78.2 - 95.6 %   Platelets 180 150 - 400 K/uL   Neutrophils Relative % 58 %   Neutro Abs 4.4 1.7 - 7.7 K/uL   Lymphocytes Relative 32 %   Lymphs Abs 2.5 0.7 - 4.0 K/uL   Monocytes Relative 8 %   Monocytes Absolute 0.6 0.1 - 1.0 K/uL   Eosinophils Relative 2 %   Eosinophils Absolute 0.2 0.0 - 0.7 K/uL   Basophils Relative 0 %   Basophils Absolute 0.0 0.0 - 0.1 K/uL  Urine microscopic-add on  Result Value Ref Range   Squamous Epithelial / LPF 0-5 (A) NONE SEEN   WBC, UA 6-30 0 - 5 WBC/hpf   RBC / HPF 0-5 0 - 5 RBC/hpf   Bacteria, UA RARE (A) NONE SEEN     Imaging Review Ct Abdomen Pelvis W Contrast  06/21/2015  CLINICAL DATA:  Lower abdominal pain for 6 days. EXAM: CT ABDOMEN AND PELVIS WITH CONTRAST TECHNIQUE: Multidetector CT imaging of the abdomen and pelvis was performed using the standard protocol following bolus administration of intravenous contrast. CONTRAST:  OMNIPAQUE IOHEXOL 300 MG/ML  SOLN COMPARISON:  None. FINDINGS: Lower chest: The lung bases are clear of acute process. No pleural effusion or pulmonary lesions. The heart is normal in size. No pericardial effusion. The  distal esophagus and aorta are unremarkable. Hepatobiliary: No focal hepatic lesions or intrahepatic biliary dilatation. The gallbladder is normal. No common bile duct of these. Pancreas: No mass, inflammation or ductal dilatation. Spleen: Normal size.  No focal lesions. Adrenals/Urinary Tract: The adrenal glands and kidneys are unremarkable. Stomach/Bowel: The stomach, duodenum, small bowel and colon are unremarkable. No inflammatory changes, mass lesions or obstructive findings. The terminal ileum is normal. The appendix is normal. Vascular/Lymphatic: No mesenteric or retroperitoneal mass or adenopathy. Moderate atherosclerotic calcifications involving the aorta and branch vessels. Reproductive: The uterus and ovaries are normal. Other: The bladder is normal. No pelvic mass or adenopathy. No significant free pelvic fluid collections. No inguinal mass or adenopathy. Musculoskeletal: No significant bony findings. Moderate degenerative disc disease at L5-S1. IMPRESSION: 1. No acute abdominal/pelvic findings, mass lesions or adenopathy. 2. Moderate atherosclerotic calcifications involving aorta. 3. Normal appearance of the kidneys, ureters and bladder. Electronically Signed   By: Rudie Meyer M.D.   On: 06/21/2015 15:53   I have personally reviewed and evaluated these images and lab results as part of my medical decision-making.   EKG Interpretation None      MDM   Final diagnoses:  Right lower quadrant abdominal pain  Dysuria    Patient with CT scan of the abdomen pending. Patient with complaint of pain in the suprapubic and right lower quadrant area for a week. Also dysuria and difficulty urinating. However patient's urinalysis without the distinct evidence of urinary tract infection. Sent for culture. Bacteria is rare nitrite was negative. Does have some evidence of leukocytes in the urine however. Patient with CT scan of abdomen to evaluate the intra-abdominal process. Patient without the  leukocytosis labs without significant abnormalities. No renal function insufficiency.  Patient improved with pain medication here in the emergency department.  Had ordered doing a bladder scan to see if the emptying her bladder however the scan could not be located therefore could not be done.  Vanetta Mulders, MD 06/21/15 1540  CT scan of the abdomen without any significant findings. Will treat his potential urinary tract infection urine cultures pending. Will also treat with hydromorphone for pain. Patient not able to take codeine or morphine but able to take that. Patient will follow-up with her regular doctor. Patient will return for any new or worse symptoms.   Vanetta Mulders, MD 06/21/15 [id number]

## 2015-06-21 NOTE — ED Notes (Signed)
Patient comes from Home states she had urinary rentention x6 days. Patient states she feels pressure but unable to to urinate. Per EMS patient was able to urinate on the truck however patient states pain and and still feels pressure, and burning.

## 2015-06-22 ENCOUNTER — Telehealth: Payer: Self-pay | Admitting: Surgery

## 2015-06-22 NOTE — Telephone Encounter (Signed)
ED CM received call from patient in distress. Patient reports being seen in the [facility] ED last night and was given prescriptions. She states, she lives alone and does not go out without assistance, and has no way to get to a pharmacy at this time. Patient is a client for [facility], and is in a lock out program whereby her prescriptions are only covered there. Patient provided permission to contact pharmacy concerning this issue. CM contact pharmacy [phone] spoke with Misty Stanley, provided here with prescription information. She confirms that they will be able to deliver meds tomorrow and pick up prescription for and deliver her pain meds as well. Updated patient with this information. Teach back done verbalized understanding.  No further CM needs identifed

## 2015-06-23 LAB — URINE CULTURE: Culture: >=100000

## 2015-06-24 ENCOUNTER — Encounter: Payer: Self-pay | Admitting: Internal Medicine

## 2015-06-24 ENCOUNTER — Ambulatory Visit (INDEPENDENT_AMBULATORY_CARE_PROVIDER_SITE_OTHER): Payer: Medicare Other | Admitting: Internal Medicine

## 2015-06-24 ENCOUNTER — Telehealth (HOSPITAL_COMMUNITY): Payer: Self-pay

## 2015-06-24 VITALS — BP 120/68 | HR 62 | Temp 97.9°F | Wt 169.6 lb

## 2015-06-24 DIAGNOSIS — K5900 Constipation, unspecified: Secondary | ICD-10-CM

## 2015-06-24 DIAGNOSIS — L219 Seborrheic dermatitis, unspecified: Secondary | ICD-10-CM | POA: Diagnosis not present

## 2015-06-24 DIAGNOSIS — Z0000 Encounter for general adult medical examination without abnormal findings: Secondary | ICD-10-CM

## 2015-06-24 DIAGNOSIS — N390 Urinary tract infection, site not specified: Secondary | ICD-10-CM | POA: Diagnosis not present

## 2015-06-24 DIAGNOSIS — I10 Essential (primary) hypertension: Secondary | ICD-10-CM | POA: Diagnosis not present

## 2015-06-24 DIAGNOSIS — M545 Low back pain: Secondary | ICD-10-CM | POA: Diagnosis not present

## 2015-06-24 DIAGNOSIS — F329 Major depressive disorder, single episode, unspecified: Secondary | ICD-10-CM | POA: Diagnosis not present

## 2015-06-24 DIAGNOSIS — B9620 Unspecified Escherichia coli [E. coli] as the cause of diseases classified elsewhere: Secondary | ICD-10-CM

## 2015-06-24 DIAGNOSIS — Z139 Encounter for screening, unspecified: Secondary | ICD-10-CM | POA: Diagnosis not present

## 2015-06-24 DIAGNOSIS — K5903 Drug induced constipation: Secondary | ICD-10-CM | POA: Insufficient documentation

## 2015-06-24 DIAGNOSIS — Z1389 Encounter for screening for other disorder: Secondary | ICD-10-CM | POA: Diagnosis not present

## 2015-06-24 HISTORY — DX: Constipation, unspecified: K59.00

## 2015-06-24 MED ORDER — POTASSIUM CHLORIDE ER 20 MEQ PO TBCR: 40.0000 meq | EXTENDED_RELEASE_TABLET | Freq: Two times a day (BID) | ORAL | Status: DC

## 2015-06-24 NOTE — Assessment & Plan Note (Signed)
Was started on lyrica for pain control in December. Now she reports urinary retention, for 3 days. She stopped taking lyrica 1 week ago. Urinary retention has resolved.  She request and insists i put lyrica on her allergy list.   Plan- I explained to patient lyrica is not known to cause urinary retention, actually causes the opposite- urinary incontinence and frequency. Pt insist the lyrica is the cause - Added lyrica to med allergy list

## 2015-06-24 NOTE — Progress Notes (Signed)
Medicine attending: Medical history, presenting problems, physical findings, and medications, reviewed with resident physician Dr Ejiro Emokpae on the day of the patient visit and I concur with her evaluation and management plan. 

## 2015-06-24 NOTE — Progress Notes (Signed)
Patient ID: Miranda Gaines, female   DOB: 12-03-1955, 61 y.o.   MRN: [id number]   Subjective:   Patient ID: Miranda Gaines female   DOB: 12/15/1955 60 y.o.   MRN: [id number]  HPI: [name] is a 60 y.o. with PMH listed below, presented for Ed follow up. She was seen in the ED- 06/20/2014 with complaints of urinary retention. She also had dysuria. She was given a course of ciprofloxacin. She thinks her urinary retention is related to starting Lyrica in December to help control her back pain. She stopped taking this med a week ago. She has been able to pass urine since then without any problems after her Ed visit 2 days ago. She started taking the course of cipro yesterday. She still has some dysuria and lower abdominal pain. She has no fever or CVA tenderness. Pt requests specifically and insists I put Lyrica on her allergy list, ibuprofen also caused similar reaction in the past and is on her allergy list.  Past Medical History  Diagnosis Date  . Hyperlipidemia   . Hypertension   . GERD (gastroesophageal reflux disease)   . Depression     f/b Dr. Cardell Peach at [facility]  . Degenerative disc disease, cervical     L spine 10/09 showing DJD and cervical spurring but no specific foraminal narrowing. Has been followed by Rochele Pages, pain management physician in [place].  . Pyloric stenosis     s/p EGD with balloon dilatation on 06/23/2007 by Dr. Elnoria Howard  . Tobacco abuse     Did not tolerate Chantix (vomiting)  . Wears hearing aid     In r. ear, followed by ENT. 2/2 recurrent otitis  . Seborrheic dermatitis     of face, sees Dr. Merlyn Albert Luptom, dermatop cream TID  . Herniated disc     L5-S1. s/p laminectomy in 1993 by Dr. Shelle Iron.  MRI June 2005 showing L4/5 disc bulge + mild facet arthropathy. F/b Dr. Marzetta Board for pain management. Wheel chair bound.   Review of Systems: CONSTITUTIONAL- No Fever, or change in appetite. SKIN- mild facial redness- chronic, from seborrheic  dermatitis HEAD- No Headache or dizziness. Mouth/throat- No Sorethroat, or bleeding gums. RESPIRATORY- No Cough or SOB. CARDIAC- No Palpitations, or chest pain. GI- No diarrhoea, or abd pain. NEUROLOGIC- chronic left leg weakness  Objective:  Physical Exam: Filed Vitals:   06/24/15 0839  BP: 120/68  Pulse: 62  Temp: 97.9 F (36.6 C)  TempSrc: Oral  Weight: 169 lb 9.6 oz (76.93 kg)  SpO2: 100%   GENERAL- alert, co-operative, appears as stated age, not in any distress, in a motorized wheel chair HEENT- Atraumatic, normocephalic, PERRL CARDIAC- RRR, no murmurs, rubs or gallops. RESP- Moving equal volumes of air, and no wheezes or crackles. ABDOMEN- Soft, tenderness lower abdomen, no rebound or guarding NEURO- No obvious Cr N abnormality, strenght  extremities- 4/5- LLE, 5/5 all other extremities, some mild jerking motion of left lower extremity when checking strength, pt says this is chronic since her back surgery. EXTREMITIES-  no pedal edema. SKIN- Warm, slight redness and scaling around nose, chronic, seborrheic dermatitis PSYCH- Normal mood and affect, appropriate thought content and speech.  Assessment & Plan:  The patient's case and plan of care was discussed with attending physician, Dr. Cyndie Chime.  Please see problem based charting for assessment and plan.

## 2015-06-24 NOTE — Telephone Encounter (Signed)
Post ED Visit - Positive Culture Follow-up  Culture report reviewed by antimicrobial stewardship pharmacist:   Enzo Bi, Pharm.D.  Celedonio Miyamoto, Pharm.D., BCPS  Garvin Fila, Pharm.D.  Georgina Pillion, Pharm.D., BCPS  [place], [address].D., BCPS, AAHIVP  Estella Husk, Pharm.D., BCPS, AAHIVP  Tennis Must, Pharm.D.  Sherle Poe, [address].D. Rocky Crafts, Pharm.D.  Positive urine culture, </= 100,000 colonies -> E Coli Treated with Ciprofloxacin, organism sensitive to the same and no further patient follow-up is required at this time.  Arvid Right 06/24/2015, 11:55 AM

## 2015-06-24 NOTE — Assessment & Plan Note (Addendum)
Presented to ED- 2/27 with dysuria, urinary retention and lower abdominal pain. She called the EMS for urinary retention. Bladder scan could not be done in the Ed as the machine was not available. She got a Ct abd and pelvis which was unremarkable. Urine cultures- grew- >100 000 colonies of E coli. She is on appropriate coverage with ciprofloxacin. Started taking this yesterday.  Plan- complete 7 day course of Cipro - K low in Ed- 3.3, no diarrhea, vomiting or diuretics, will replete- Kdur BID for 2 doses. - Pt was about to leave when she mentioned vulva itching, she denied vaginal discharge, foul odor, she will follow up with her  PCP 06/29/2015. She agreed.

## 2015-06-24 NOTE — Assessment & Plan Note (Signed)
Screen HEP C and HIV.

## 2015-06-24 NOTE — Patient Instructions (Signed)
We have added Lyrica to your problem list.  Also we have also called in a prescription for potassium pills, because your potassium levels were low when you went to the Emergency department. Take 2 tablets two times a day.

## 2015-06-25 ENCOUNTER — Other Ambulatory Visit: Payer: Self-pay | Admitting: Internal Medicine

## 2015-06-25 LAB — HIV ANTIBODY (ROUTINE TESTING W REFLEX): HIV Screen 4th Generation wRfx: NONREACTIVE

## 2015-06-25 LAB — HEPATITIS C ANTIBODY: Hep C Virus Ab: <0.1 s/co ratio (ref 0.0–0.9)

## 2015-06-29 ENCOUNTER — Encounter: Payer: Self-pay | Admitting: Internal Medicine

## 2015-06-29 ENCOUNTER — Ambulatory Visit (INDEPENDENT_AMBULATORY_CARE_PROVIDER_SITE_OTHER): Payer: Medicare Other | Admitting: Internal Medicine

## 2015-06-29 VITALS — BP 115/68 | HR 58 | Temp 98.1°F | Wt 171.3 lb

## 2015-06-29 DIAGNOSIS — Z79899 Other long term (current) drug therapy: Secondary | ICD-10-CM | POA: Diagnosis not present

## 2015-06-29 DIAGNOSIS — N390 Urinary tract infection, site not specified: Secondary | ICD-10-CM

## 2015-06-29 DIAGNOSIS — Z6831 Body mass index (BMI) 31.0-31.9, adult: Secondary | ICD-10-CM | POA: Diagnosis not present

## 2015-06-29 DIAGNOSIS — B9689 Other specified bacterial agents as the cause of diseases classified elsewhere: Secondary | ICD-10-CM | POA: Diagnosis not present

## 2015-06-29 DIAGNOSIS — F329 Major depressive disorder, single episode, unspecified: Secondary | ICD-10-CM

## 2015-06-29 DIAGNOSIS — E785 Hyperlipidemia, unspecified: Secondary | ICD-10-CM | POA: Diagnosis not present

## 2015-06-29 DIAGNOSIS — G8929 Other chronic pain: Secondary | ICD-10-CM

## 2015-06-29 DIAGNOSIS — M25561 Pain in right knee: Secondary | ICD-10-CM | POA: Diagnosis not present

## 2015-06-29 DIAGNOSIS — M25562 Pain in left knee: Secondary | ICD-10-CM | POA: Diagnosis not present

## 2015-06-29 DIAGNOSIS — I10 Essential (primary) hypertension: Secondary | ICD-10-CM | POA: Diagnosis not present

## 2015-06-29 DIAGNOSIS — L219 Seborrheic dermatitis, unspecified: Secondary | ICD-10-CM

## 2015-06-29 DIAGNOSIS — K5900 Constipation, unspecified: Secondary | ICD-10-CM

## 2015-06-29 DIAGNOSIS — E669 Obesity, unspecified: Secondary | ICD-10-CM

## 2015-06-29 DIAGNOSIS — M545 Low back pain, unspecified: Secondary | ICD-10-CM

## 2015-06-29 DIAGNOSIS — F32A Depression, unspecified: Secondary | ICD-10-CM

## 2015-06-29 NOTE — Patient Instructions (Signed)
-   Start taking potassium pills tomorrow.  - You can start taking miralax again on 07/01/15 - You can try dried prunes, prune juice or apple juice to see if this helps your constipation. Also eat more vegetables.  - We will recheck your potassium level in 1 week - Referral placed to Ms. Lupita LeashDonna for nutrition education - I will change your cholesterol medicine to one that has a smaller pill  General Instructions:   Thank you for bringing your medicines today. This helps us keep you safe from mistakes.   Progress Toward Treatment Goals:  Treatment Goal 08/10/2014  Blood pressure deteriorated    Self Care Goals & Plans:  Self Care Goal 06/29/2015  Manage my medications take my medicines as prescribed; bring my medications to every visit; refill my medications on time  Monitor my health keep track of my blood pressure  Eat healthy foods eat more vegetables; eat foods that are low in salt; eat baked foods instead of fried foods  Be physically active find an activity I enjoy    No flowsheet data found.   Care Management & Community Referrals:  Referral 08/13/2013  Referrals made for care management support none needed  Referrals made to community resources none

## 2015-07-01 DIAGNOSIS — E669 Obesity, unspecified: Secondary | ICD-10-CM | POA: Insufficient documentation

## 2015-07-01 DIAGNOSIS — E663 Overweight: Secondary | ICD-10-CM | POA: Insufficient documentation

## 2015-07-01 NOTE — Assessment & Plan Note (Signed)
Continue Flexeril as needed. She stopped Gabapentin and does not wish to restart this as the pain is tolerable without it.

## 2015-07-01 NOTE — Assessment & Plan Note (Signed)
Continue Lipitor 80 mg daily for now. I will ask Dr. Selena BattenKim if there is a statin with a smaller pill.

## 2015-07-01 NOTE — Assessment & Plan Note (Signed)
Continue Nortriptyline and Trazodone as per mental health

## 2015-07-01 NOTE — Assessment & Plan Note (Signed)
We extensively discussed diet today as this will both help her constipation and weight loss goals. I will refer her to Lupita LeashDonna as she is interested in nutrition education.

## 2015-07-01 NOTE — Assessment & Plan Note (Signed)
Continue biogel topical as needed.

## 2015-07-01 NOTE — Assessment & Plan Note (Signed)
She is finishing up Cipro to treat her UTI. She is still having mild RLQ abdominal pain but I believe this is likely related to constipation. She reports feeling constipated and has about 1 bowel movement per week which is unusual for her. We discussed the importance of diet and drinking water to help her constipation.  - Complete Cipro - Start K-Dur tomorrow - Recheck bmet next week  - Advised to try dried prunes, prune or apple juice, and increasing her vegetable intake to help her constipation. Advised to start Miralax if more natural ways do not help.

## 2015-07-01 NOTE — Progress Notes (Signed)
   Subjective:    Patient ID: Miranda Gaines, female    DOB: 16-Feb-1956, 60 y.o.   MRN: [id number]  HPI Ms. Miranda Gaines is a 60yo woman with PMHx of HTN,  chronic bilateral knee pain and low back pain, depression, and hyperlipidemia who presents today for follow up of her abdominal pain.  Abdominal Pain: [facility] urinary retention and lower abdominal pain. She reported that Lyrica was the cause for her urinary retention. She was also found to have a UTI for which she was prescribed Ciprofloxacin. She reports taking Cipro and has 2 days left. She denies any further episodes of urinary retention or difficulties urinating. She does note RLQ abdominal pain that is intermittent. She notes when she is about to finish urinating she feels a "strain" and that is when the pain is the worst. She reports severe constipation, having 1 bowel movement per week. She reports only being able to have a BM when taking Miralax. She is concerned about her low potassium that was found on routine blood work last week. Her K was noted to be 3.3. She has not started the oral potassium as it was mail ordered. She will start this tomorrow.  HTN: BP well controlled today at 115/68. She takes Norvasc 10 mg daily.   Seborrheic Dermatitis: She reports the selenium shampoo did not help. She has been using alcohol swabs on her scalp which helps. She denies any burning. She is using topical hydrocortisone on her face which helps.  LBP/Knee Pain: She reports her pain is well controlled with Flexeril and biogel for her knees. She states she stopped taking Gabapentin a few months ago before she was started on Lyrica. However, she did not tolerate Lyrica well as noted above.   Depression: She reports her depression is well controlled on Nortriptyline and Trazodone. She is seen by mental health.  Hyperlipidemia: She takes Atorvastatin 80 mg daily. She is curious if there is a smaller pill that she can take as  she breaks the atorvastatin pill in half but this is still difficult for her to swallow.   Obesity: BMI 31. She is concerned about her weight and is motivated to improve her diet. She reports eating very little amounts of vegetables and more fast food.    Review of Systems General: Denies fever, chills, night sweats, changes in weight, changes in appetite HEENT: Denies headaches, ear pain, changes in vision, rhinorrhea, sore throat CV: Denies CP, palpitations, SOB, orthopnea Pulm: Denies SOB, cough, wheezing GI: Denies abdominal pain, nausea, vomiting, diarrhea, constipation, melena, hematochezia GU: Denies dysuria, hematuria, frequency Msk: Denies muscle cramps, joint pains Neuro: Denies weakness, numbness, tingling Skin: Denies rashes, bruising Psych: Denies depression, anxiety, hallucinations    Objective:   Physical Exam General: alert, sitting up, transfers from electric scooter to exam table easily, NAD HEENT: [place]/AT, EOMI, sclera anicteric, mucus membranes moist CV: RRR, no m/g/r Pulm: CTA bilaterally, breaths non-labored Abd: BS+, soft, obese, non-tender Ext: warm, no peripheral edema, decreased ROM in LLE Neuro: alert and oriented x 3, no focal deficits, strength 5/5 in upper and lower extremities Skin: Dry areas on scalp consistent with seborrheic dermatitis      Assessment & Plan:  Please refer to A&P documentation.

## 2015-07-01 NOTE — Assessment & Plan Note (Signed)
Continue hydrocortisone cream as needed for facial SD. She seems to be doing well with alcohol wipes for her scalp. I advised her to stop the alcohol wipes if she gets burning or irritation.

## 2015-07-01 NOTE — Assessment & Plan Note (Signed)
BP well controlled. - Continue Norvasc 10 mg daily

## 2015-07-02 NOTE — Progress Notes (Signed)
Internal Medicine Clinic Attending  Case discussed with Dr. Rivet at the time of the visit.  We reviewed the resident's history and exam and pertinent patient test results.  I agree with the assessment, diagnosis, and plan of care documented in the resident's note.  

## 2015-07-06 ENCOUNTER — Encounter: Payer: Self-pay | Admitting: Dietician

## 2015-07-06 ENCOUNTER — Ambulatory Visit (INDEPENDENT_AMBULATORY_CARE_PROVIDER_SITE_OTHER): Payer: Medicare Other | Admitting: Dietician

## 2015-07-06 ENCOUNTER — Encounter: Payer: Medicare Other | Admitting: Dietician

## 2015-07-06 ENCOUNTER — Other Ambulatory Visit (INDEPENDENT_AMBULATORY_CARE_PROVIDER_SITE_OTHER): Payer: Medicare Other

## 2015-07-06 VITALS — Wt 168.4 lb

## 2015-07-06 DIAGNOSIS — Z6830 Body mass index (BMI) 30.0-30.9, adult: Secondary | ICD-10-CM | POA: Diagnosis not present

## 2015-07-06 DIAGNOSIS — K5900 Constipation, unspecified: Secondary | ICD-10-CM

## 2015-07-06 DIAGNOSIS — Z713 Dietary counseling and surveillance: Secondary | ICD-10-CM | POA: Diagnosis not present

## 2015-07-06 DIAGNOSIS — E785 Hyperlipidemia, unspecified: Secondary | ICD-10-CM | POA: Diagnosis present

## 2015-07-06 NOTE — Patient Instructions (Signed)
Try to get up and out of bed by 8 AM;      Eat breakfast by 9 AM  Limit to 2 cups of coffee  Eat more fiber by eating more fruits, vegetables (beans, broccoli, etc) , nuts, and seeds, old fashioned oats,  whole grain bread, crackers, pasta and brown rice  Eat Healthy fats by using low fat milk and cheese, light ranch dressing, boil, bake grill or oven fry meats, eat more chicken, fish and Mal[place]key than beef or pork   Limit sugar by using less in your coffee to 1-2 teaspoons, limit juice it has a lot of sugar, use light yogurt, sugar free candy, sugar free jello, no sugar added frozen dairy dessert    Fish 3x a week- salmon, sardines,- can have fish and eggs for a meal  Pizza- okay with a vegetable stopping or anchovies , broccoli and spinach topping  Eat less: bacon, sausage, fried foods, bologna, instant and processed foods.

## 2015-07-06 NOTE — Progress Notes (Signed)
  Medical Nutrition Therapy:  Appt start time: 0930 end time:  1030. Visit # 1  Assessment:  Primary concerns today: lowering her cholesterol and releiving her constipation  She quit regular soda over a year ago, would like to eat healthier and have better balanced meals on a schedule. She stays in a power wheelchari most of the day but says she does exercises before she gets out of bed. She is enthuysiastic about adopting a healhtier meal plana nd lifestyle  Preferred Learning Style: No preference indicated , but seemed to enjoy the visuals Learning Readiness: Ready  Change in progress  ANTHROPOMETRICS: weight-168.4#, BMI-30.7 WEIGHT HISTORY:need to assess at future visit, has been decreasing her weight gradually since stopping regular soda.  SLEEP:says she sleeps too well- from 1 Pm to 11 am MEDICATIONS: nortryptyline can cause weight gain and may be contributing to excessive sleep, trazadone is also likely adding to excessive sleep DIETARY INTAKE: Usual eating pattern includes 2 meals and 2 snacks per day.   24-hr recall:  B ( 11AM): 3 6- 8 oz cups coffee with 3-4 teaspoons sugar each, whole milk  Snk ( 1-2 PM): handful of grapes, apple, bologna sandwich D (7 PM): fried wings, white rice, broccoli with cheese Snk ( 7-9 PM): hard candy, gun, potato chips Beverages: 12-20 oz juice per day, water, coffee ~ 18 oz regular/day  Usual physical activity: limited  Progress Towards Goal(s):  In progress.   Nutritional Diagnosis:  NB-1.1 Food and nutrition-related knowledge deficit As related to lack of prior adequate training.  As evidenced by her report and enthusiasm in healthy meal planning.    Intervention:  Nutrition education about healthy meal planning, how to increase fiber, nutrient density, healthy beverages, fats and ways to decrease her sugar intake.  Coordination of care:  nortryptyline can cause weight gain and may be contributing to excessive sleep, trazadone is also likely  adding to excessive sleep  Teaching Method Utilized: Visual. Auditory, Hands on Handouts given during visit include:plate method, how to increase fiber, shopping list suggestions Barriers to learning/adherence to lifestyle change: support, limited material resources Demonstrated degree of understanding via:  Teach Back   Monitoring/Evaluation:  Dietary intake, exercise, and body weight in 3 week(s).

## 2015-07-07 LAB — BMP8+ANION GAP
Anion Gap: 20 mmol/L — ABNORMAL HIGH (ref 10.0–18.0)
BUN/Creatinine Ratio: 10 — ABNORMAL LOW (ref 11–26)
BUN: 10 mg/dL (ref 8–27)
CO2: 20 mmol/L (ref 18–29)
Calcium: 9.8 mg/dL (ref 8.7–10.3)
Chloride: 102 mmol/L (ref 96–106)
Creatinine, Ser: 0.97 mg/dL (ref 0.57–1.00)
GFR calc Af Amer: 73 mL/min/{1.73_m2} (ref >59)
GFR calc non Af Amer: 64 mL/min/{1.73_m2} (ref >59)
Glucose: 89 mg/dL (ref 65–99)
Potassium: 4 mmol/L (ref 3.5–5.2)
Sodium: 142 mmol/L (ref 134–144)

## 2015-07-21 ENCOUNTER — Other Ambulatory Visit: Payer: Self-pay | Admitting: Internal Medicine

## 2015-07-27 ENCOUNTER — Ambulatory Visit (INDEPENDENT_AMBULATORY_CARE_PROVIDER_SITE_OTHER): Payer: Medicare Other | Admitting: Dietician

## 2015-07-27 ENCOUNTER — Encounter: Payer: Self-pay | Admitting: Dietician

## 2015-07-27 ENCOUNTER — Other Ambulatory Visit: Payer: Self-pay | Admitting: Internal Medicine

## 2015-07-27 VITALS — Wt 167.3 lb

## 2015-07-27 DIAGNOSIS — E669 Obesity, unspecified: Secondary | ICD-10-CM

## 2015-07-27 DIAGNOSIS — Z6830 Body mass index (BMI) 30.0-30.9, adult: Secondary | ICD-10-CM

## 2015-07-27 DIAGNOSIS — Z993 Dependence on wheelchair: Secondary | ICD-10-CM | POA: Diagnosis not present

## 2015-07-27 DIAGNOSIS — E785 Hyperlipidemia, unspecified: Secondary | ICD-10-CM | POA: Diagnosis not present

## 2015-07-27 DIAGNOSIS — Z713 Dietary counseling and surveillance: Secondary | ICD-10-CM | POA: Diagnosis not present

## 2015-07-27 NOTE — Progress Notes (Signed)
  Medical Nutrition Therapy:  Appt start time: 1015 end time:  1115. Visit # 2  Assessment:  Primary concerns today: lowering her cholesterol and releiving her constipation  Miranda Gaines complains of headache and constipation today. She did not have her usual cups of coffee and no bowel movement in at least 1 week. She went shopping in the past few days and with the help of her friend bought lot's of higher fiber fruits, vegetables whole grains and lower fat meats like chicken and salmon.  She has questions about what a balanced meal looks like and consists of and food safety- planning for leftovers. She is also concerned about not losing weight too fast and what a healthy bodyweight for her is.   Preferred Learning Style:  visuals Learning Readiness: Ready  Change in progress, goal weight 150-155#  ANTHROPOMETRICS: weight-167.3#, BMI-30. WEIGHT HISTORY:120-130 before becoming disabled. Highest is 180s.  SLEEP:too well- 11 Pm to 11 AM MEDICATIONS: nortryptyline can cause weight gain and may be contributing to excessive sleep, trazadone is also likely adding to excessive sleep DIETARY INTAKE: Usual eating pattern includes 2 meals and 2 snacks per day.    Usual physical activity: limited to wheelchair, feels she needs more activity, her legs are very weak  Progress Towards Goal(s):  Some progress.   Nutritional Diagnosis:  NB-1.1 Food and nutrition-related knowledge deficit As related to lack of prior adequate training is im[proving  As evidenced by her report and recall of healthier food choices that she bought this week.    Intervention:  Nutrition education about healthy meal planning, increasing fiber to relieve constipation, nutrient density, healthy beverages, fats and ways to decrease her sugar intake.  Coordination of care:  nortryptyline can cause weight gain and may be contributing to excessive sleep, trazadone is also likely adding to excessive sleep  Teaching Method Utilized: Visual.  Auditory, Hands on Handouts given during visit include:plate method, how to increase fiber, shopping list suggestions Barriers to learning/adherence to lifestyle change: support, limited material resources Demonstrated degree of understanding via:  Teach Back   Monitoring/Evaluation:  Dietary intake, exercise, and body weight in 3 week(s).

## 2015-07-27 NOTE — Patient Instructions (Addendum)
Your weight was 167. 3 today.   I suggest you weigh about 150- to 155#. You will need to drop 10-15 pounds  Try to eat a fruit, a protein, a starch and a vegetable from your list  at lunch and dinner every day.

## 2015-08-18 ENCOUNTER — Ambulatory Visit (INDEPENDENT_AMBULATORY_CARE_PROVIDER_SITE_OTHER): Payer: Medicare Other | Admitting: Dietician

## 2015-08-18 VITALS — Wt 166.3 lb

## 2015-08-18 DIAGNOSIS — Z8719 Personal history of other diseases of the digestive system: Secondary | ICD-10-CM | POA: Diagnosis not present

## 2015-08-18 DIAGNOSIS — Z713 Dietary counseling and surveillance: Secondary | ICD-10-CM

## 2015-08-18 DIAGNOSIS — Z6829 Body mass index (BMI) 29.0-29.9, adult: Secondary | ICD-10-CM | POA: Diagnosis not present

## 2015-08-18 DIAGNOSIS — E785 Hyperlipidemia, unspecified: Secondary | ICD-10-CM

## 2015-08-18 DIAGNOSIS — E669 Obesity, unspecified: Secondary | ICD-10-CM

## 2015-08-18 NOTE — Patient Instructions (Signed)
You are due to see a doctor in September 2017.  Best beverages to drink instead of apple juice:  water- unlimited Coffee 1 cup regular a day, 1 cup decaf  tea- hot or cold- 2 cups a day Sparkling soda - unlimited Diet flavored water - un limited Diet soda 1 /day soymilk or chocolate milk- 1 cup a day  1 cup is about the size of your fist.   Please make an appointment for 4 weeks.

## 2015-08-18 NOTE — Progress Notes (Signed)
  Medical Nutrition Therapy:  Appt start time: 0936 end time:  1045. Visit # 3  Assessment:  Primary concerns today: lowering her cholesterol and releiving her constipation  Miranda Gaines is happy that she is now having a soft bowel movement daily without using medicine. She feels better, and more confident that she knows what a healthy diet is. Her sister is supporting her in this change and she finds the food affordable. Her weight is decreased 3# in last month. She wants help knowing what are helahty =beverages as she is tired of apple juice.   Preferred Learning Style:  visuals Learning Readiness: Ready  Change in progress, goal weight 150-155#  ANTHROPOMETRICS: weight-166.3#, BMI-29.- no longer obese! MEDICATIONS: nortryptyline can cause weight gain and may be contributing to excessive sleep, trazadone is also likely adding to excessive sleep DIETARY INTAKE: Usual eating pattern includes 2 meals and 2 snacks per day.  B:Oatmeal with cinnamin fruit, coffee, egg, juice and toast L and dinner: salad,  fish or chicken, bread and vegetables, juice or water Snacks: prunes, nuts, raisins, banana, apple, orange  Usual physical activity: limited to wheelchair, feels she needs more activity, her legs are very weak  Progress Towards Goal(s):  Some progress.   Nutritional Diagnosis:  NB-1.1 Food and nutrition-related knowledge deficit As related to lack of prior adequate training is improving  As evidenced by her report and recall of healthier food choices that she bought this week.    Intervention:  Nutrition education review about healthy meal planning, education about healthy snacks and beverages.  Coordination of care:  Sent note to doctor about rechecking lipids, nortryptyline can cause weight gain, trazadone may be adding to excessive sleep  Teaching Method Utilized: Visual. Auditory, Hands on Handouts given during visit include:plate method, AVS, healthy snack options  Barriers to  learning/adherence to lifestyle change: support, limited material resources Demonstrated degree of understanding via:  Teach Back   Monitoring/Evaluation:  Dietary intake, exercise, and body weight in 6 week(s)  Next visit to discuss activity and support

## 2015-08-25 ENCOUNTER — Other Ambulatory Visit: Payer: Self-pay | Admitting: Internal Medicine

## 2015-09-02 DIAGNOSIS — F332 Major depressive disorder, recurrent severe without psychotic features: Secondary | ICD-10-CM | POA: Diagnosis not present

## 2015-09-15 ENCOUNTER — Ambulatory Visit (INDEPENDENT_AMBULATORY_CARE_PROVIDER_SITE_OTHER): Payer: Medicare Other | Admitting: Dietician

## 2015-09-15 VITALS — Wt 165.9 lb

## 2015-09-15 DIAGNOSIS — K5900 Constipation, unspecified: Secondary | ICD-10-CM | POA: Diagnosis not present

## 2015-09-15 DIAGNOSIS — E785 Hyperlipidemia, unspecified: Secondary | ICD-10-CM | POA: Diagnosis not present

## 2015-09-15 DIAGNOSIS — Z713 Dietary counseling and surveillance: Secondary | ICD-10-CM

## 2015-09-15 DIAGNOSIS — E669 Obesity, unspecified: Secondary | ICD-10-CM

## 2015-09-15 DIAGNOSIS — E663 Overweight: Secondary | ICD-10-CM

## 2015-09-15 DIAGNOSIS — Z6829 Body mass index (BMI) 29.0-29.9, adult: Secondary | ICD-10-CM

## 2015-09-15 NOTE — Patient Instructions (Signed)
"  Food is medicine."  It is OK to eat the following foods in moderation...  1.  Mal[place]urkey sandwich meat- is high in salt, but better than bologna or hot dogs  2. Kennith CenterHamburger is ok if 93% fat free- lean  3. Angel food cake- one small piece a day as your sweet food is ok as long as no lother sweets that day  4. Lite whipped topping- 1 - 2 tablespoons with fruit and angel food cake is ok  5. Sugar free hard candy- 1-2 pieces per day  Support people are important to keep you eating and making healthy choices for the long haul- the rest of your life.... Who in your life that you see or talk to often and is supportive of the healthy changes you have made...  Think about how to work in more activity into your life- today we talked about joining  [employer] through your Medicare plan or  trying to go to [facility] programs.

## 2015-09-15 NOTE — Progress Notes (Addendum)
  Medical Nutrition Therapy:  Appt start time: 1030 end time:  1130. Visit # 4  Assessment:  Primary concerns today: lowering her cholesterol and releiving her constipation  Miranda Gaines brought food records today for 16 days. She is still eating more fruits, vegetables, whole grains and lean protein, but not having bowel movements daily. She is very sedentary, sits in her wheelchair most of the day. Only activity is to go to church and doctor's appointments. She reports little social contact other than talks to her sister one time a day and a church member takes her to the grocery one time a week.   Preferred Learning Style:  visuals Learning Readiness: Ready  Change in progress, goal weight 150-155#  ANTHROPOMETRICS: weight-165.9#, BMI-29.- no longer obese! MEDICATIONS: Daily nexium- consider using on an as needed basis as this can inhibit absorption of vital nutrients. nortryptyline can cause weight gain,  may be contributing to excessive sleep, trazadone can also add to excessive sleep DIETARY INTAKE: Usual eating pattern includes 3 meals and 2 snacks per day.  B:Oatmeal with cinnamon, sometimes fruit, decaf coffee, egg, juice and 2 slices toast L and dinner: salad,  fish or chicken, bread and vegetables, fruit for dessert, juice or water Snacks: prunes, nuts, raisins, banana, apple, orange, melon, tuna  Usual physical activity: limited to wheelchair, feels she needs more activity, her legs are very weak  Progress Towards Goal(s):  Some progress.   Nutritional Diagnosis:  NB-1.1 Food and nutrition-related knowledge deficit As related to lack of prior adequate training is improving  As evidenced by her report and recall of healthier food choices that she bought this week.    Intervention:  Nutrition education about exercise options ans importance of support. Reviewed healthy meal planning.   Coordination of care:nortryptyline can cause weight gain, trazadone may be adding to excessive  sleep  Teaching Method Utilized: Visual. Auditory, Hands on Handouts given during visit include:plate method, AVS, healthy snack options  Barriers to learning/adherence to lifestyle change: support, limited material resources Demonstrated degree of understanding via:  Teach Back   Monitoring/Evaluation:  Dietary intake, exercise, and body weight in 8 week(s)

## 2015-09-29 DIAGNOSIS — F332 Major depressive disorder, recurrent severe without psychotic features: Secondary | ICD-10-CM | POA: Diagnosis not present

## 2015-09-30 DIAGNOSIS — M5030 Other cervical disc degeneration, unspecified cervical region: Secondary | ICD-10-CM | POA: Diagnosis not present

## 2015-09-30 DIAGNOSIS — M961 Postlaminectomy syndrome, not elsewhere classified: Secondary | ICD-10-CM | POA: Diagnosis not present

## 2015-09-30 DIAGNOSIS — M4727 Other spondylosis with radiculopathy, lumbosacral region: Secondary | ICD-10-CM | POA: Diagnosis not present

## 2015-11-04 ENCOUNTER — Ambulatory Visit: Payer: Medicare Other | Admitting: Dietician

## 2015-11-26 ENCOUNTER — Other Ambulatory Visit: Payer: Self-pay | Admitting: Internal Medicine

## 2015-12-21 DIAGNOSIS — F332 Major depressive disorder, recurrent severe without psychotic features: Secondary | ICD-10-CM | POA: Diagnosis not present

## 2015-12-24 ENCOUNTER — Telehealth: Payer: Self-pay | Admitting: Internal Medicine

## 2015-12-24 NOTE — Telephone Encounter (Signed)
APT. REMINDER CALL, LMTCB °

## 2015-12-28 ENCOUNTER — Other Ambulatory Visit: Payer: Self-pay | Admitting: Internal Medicine

## 2015-12-28 ENCOUNTER — Ambulatory Visit (INDEPENDENT_AMBULATORY_CARE_PROVIDER_SITE_OTHER): Payer: Medicare Other | Admitting: Internal Medicine

## 2015-12-28 ENCOUNTER — Encounter: Payer: Self-pay | Admitting: Internal Medicine

## 2015-12-28 VITALS — BP 130/64 | HR 60 | Temp 97.9°F | Resp 20 | Ht 64.5 in | Wt 165.5 lb

## 2015-12-28 DIAGNOSIS — M545 Low back pain, unspecified: Secondary | ICD-10-CM

## 2015-12-28 DIAGNOSIS — Z23 Encounter for immunization: Secondary | ICD-10-CM

## 2015-12-28 DIAGNOSIS — Z0000 Encounter for general adult medical examination without abnormal findings: Secondary | ICD-10-CM

## 2015-12-28 DIAGNOSIS — M25562 Pain in left knee: Secondary | ICD-10-CM

## 2015-12-28 DIAGNOSIS — K5900 Constipation, unspecified: Secondary | ICD-10-CM

## 2015-12-28 DIAGNOSIS — Z87891 Personal history of nicotine dependence: Secondary | ICD-10-CM

## 2015-12-28 DIAGNOSIS — B352 Tinea manuum: Secondary | ICD-10-CM | POA: Diagnosis not present

## 2015-12-28 DIAGNOSIS — I10 Essential (primary) hypertension: Secondary | ICD-10-CM | POA: Diagnosis not present

## 2015-12-28 DIAGNOSIS — B353 Tinea pedis: Secondary | ICD-10-CM

## 2015-12-28 DIAGNOSIS — M25561 Pain in right knee: Secondary | ICD-10-CM

## 2015-12-28 DIAGNOSIS — G8929 Other chronic pain: Secondary | ICD-10-CM

## 2015-12-28 MED ORDER — TOLNAFTATE 1 % EX CREA: 1.0000 "application " | TOPICAL_CREAM | Freq: Two times a day (BID) | CUTANEOUS | 0 refills | Status: DC

## 2015-12-28 NOTE — Progress Notes (Signed)
   CC: HTN  HPI:  [name] is a 60 y.o. woman with PMHx as noted below who presents today for follow up of her hypertension.   HTN: BP well controlled at 130/64. She takes Norvasc 10 mg daily.   Constipation: Reports she is no longer having issues with constipation after she changed her diet. She reports eating more vegetables, cutting down on sugar, and drinking more water. She is very thankful for the education she received from Lupita LeashDonna and feels she is eating more balanced meals now. She states she has 2 bowel movements per week now when previously she would only have 1 BM per week if she took a laxative.   Low Back Pain: Reports her back pain is unchanged and well controlled with Flexeril. She takes 1 tablet in the morning and 1 in the evening.   Itching on Hands and Right Foot: Reports she started having itching on her hands in February. She then noticed that her palms had a darker area in the middle and then this darkening of skin appeared on the inside of her right ankle/foot. She has tried hydrocortisone cream that she typically uses for her seborrheic dermatitis but this has not improved the itching or skin darkening.   Past Medical History:  Diagnosis Date  . Degenerative disc disease, cervical    L spine 10/09 showing DJD and cervical spurring but no specific foraminal narrowing. Has been followed by Rochele PagesJanie Kelly, pain management physician in [place].  . Depression    [facility]  . GERD (gastroesophageal reflux disease)   . Herniated disc    L5-S1. s/p laminectomy in 1993 by Dr. Shelle IronBeane.  MRI June 2005 showing L4/5 disc bulge + mild facet arthropathy. F/b Dr. Marzetta BoardJane Keilitz for pain management. Wheel chair bound.  . Hyperlipidemia   . Hypertension   . Pyloric stenosis    s/p EGD with balloon dilatation on 06/23/2007 by Dr. Elnoria HowardHung  . Seborrheic dermatitis    of face, sees Dr. Merlyn AlbertFred Luptom, dermatop cream TID  . Tobacco abuse    Did not tolerate  Chantix (vomiting)  . Wears hearing aid    In r. ear, followed by ENT. 2/2 recurrent otitis    Review of Systems:  All negative except per HPI  Physical Exam:  Vitals:   12/28/15 1345  BP: 130/64  Pulse: 60  Resp: 20  Temp: 97.9 F (36.6 C)  TempSrc: Oral  SpO2: 100%  Weight: 165 lb 8 oz (75.1 kg)  Height: 5' 4.5" (1.638 m)   General: well-nourished, well-appearing woman sitting up, NAD HEENT: [place]/AT, EOMI, sclera anicteric, mucus membranes moist CV: RRR, no m/g/r Pulm: CTA bilaterally, breaths non-labored  Abd: BS+, soft, non-tender, non-distended Ext: warm, no edema Neuro: alert and oriented x 3 Skin: There is hyperpigmentation in the center of her palms of both hands as well as on the medial aspect of her right foot.   Assessment & Plan:   See Encounters Tab for problem based charting.  Patient discussed with Dr. Heide SparkNarendra

## 2015-12-28 NOTE — Assessment & Plan Note (Signed)
BP well controlled. Continue Norvasc 10 mg daily. Will check bmet today since has had hypokalemia in recent past. She has not been taking K-Dur that she was prescribed last visit.

## 2015-12-28 NOTE — Patient Instructions (Addendum)
General Instructions: - Apply Tolnaftate cream two times daily to your hands and right foot for 2 weeks - If this does not help then please return to clinic - Great job on your new healthy eating habits and weight loss! I am very proud of you and glad this helped your constipation as well. - Will order handle bars for your toilet  Please bring your medicines with you each time you come to clinic.  Medicines may include prescription medications, over-the-counter medications, herbal remedies, eye drops, vitamins, or other pills.   Progress Toward Treatment Goals:  Treatment Goal 08/10/2014  Blood pressure deteriorated    Self Care Goals & Plans:  Self Care Goal 12/28/2015  Manage my medications take my medicines as prescribed; bring my medications to every visit; refill my medications on time  Monitor my health keep track of my weight; check my feet daily  Eat healthy foods drink diet soda or water instead of juice or soda; eat more vegetables; eat foods that are low in salt; eat baked foods instead of fried foods; eat fruit for snacks and desserts  Be physically active find an activity I enjoy    No flowsheet data found.   Care Management & Community Referrals:  Referral 08/13/2013  Referrals made for care management support none needed  Referrals made to community resources none

## 2015-12-28 NOTE — Assessment & Plan Note (Addendum)
Continue Flexeril two times daily as needed as this controls her pain. Will also place DME order for a elevated toilet seat as this will help her use the bathroom easier.

## 2015-12-28 NOTE — Assessment & Plan Note (Signed)
Her itching and hyperpigmentation on her bilateral palms and right medial ankle/foot appears to be tinea. Will prescribe her Tolnaftate two times daily for next 2 weeks. Advised for her to return to clinic if she sees no improvement.

## 2015-12-28 NOTE — Assessment & Plan Note (Signed)
Flu shot today 

## 2015-12-28 NOTE — Assessment & Plan Note (Signed)
She reports improvement in her constipation symptoms after modifying her diet and increasing her water intake. I commended her making these lifestyle changes. Encouraged her to continue these changes and let me know if she has issues with constipation again.

## 2015-12-29 LAB — BMP8+ANION GAP
Anion Gap: 19 mmol/L — ABNORMAL HIGH (ref 10.0–18.0)
BUN/Creatinine Ratio: 15 (ref 12–28)
BUN: 12 mg/dL (ref 8–27)
CO2: 24 mmol/L (ref 18–29)
Calcium: 10.1 mg/dL (ref 8.7–10.3)
Chloride: 100 mmol/L (ref 96–106)
Creatinine, Ser: 0.78 mg/dL (ref 0.57–1.00)
GFR calc Af Amer: 96 mL/min/{1.73_m2} (ref >59)
GFR calc non Af Amer: 83 mL/min/{1.73_m2} (ref >59)
Glucose: 75 mg/dL (ref 65–99)
Potassium: 4.2 mmol/L (ref 3.5–5.2)
Sodium: 143 mmol/L (ref 134–144)

## 2015-12-29 NOTE — Progress Notes (Signed)
Internal Medicine Clinic Attending  Case discussed with Dr. Rivet soon after the resident saw the patient.  We reviewed the resident's history and exam and pertinent patient test results.  I agree with the assessment, diagnosis, and plan of care documented in the resident's note.  

## 2015-12-30 ENCOUNTER — Telehealth: Payer: Self-pay | Admitting: *Deleted

## 2015-12-30 MED ORDER — CLOTRIMAZOLE 1 % EX CREA: 1.0000 "application " | TOPICAL_CREAM | Freq: Two times a day (BID) | CUTANEOUS | 0 refills | Status: DC

## 2015-12-30 NOTE — Telephone Encounter (Signed)
Sent for clotrimazole cream. Thanks!

## 2015-12-30 NOTE — Telephone Encounter (Signed)
Call from pt - states her pharmacy did not delivery Tinactin b/c it can be purchase OTC; states she needs another cream ordered which her insurance will pay. Does not have the money to pay out of pocket. Thanks

## 2016-01-18 ENCOUNTER — Encounter: Payer: Medicare Other | Admitting: Internal Medicine

## 2016-01-21 ENCOUNTER — Other Ambulatory Visit: Payer: Self-pay | Admitting: Internal Medicine

## 2016-01-25 ENCOUNTER — Telehealth: Payer: Self-pay | Admitting: Internal Medicine

## 2016-01-25 NOTE — Telephone Encounter (Signed)
APT. REMINDER CALL, LMTCB °

## 2016-01-26 ENCOUNTER — Ambulatory Visit (INDEPENDENT_AMBULATORY_CARE_PROVIDER_SITE_OTHER): Payer: Medicare Other | Admitting: Dietician

## 2016-01-26 DIAGNOSIS — Z713 Dietary counseling and surveillance: Secondary | ICD-10-CM | POA: Diagnosis not present

## 2016-01-26 DIAGNOSIS — Z6827 Body mass index (BMI) 27.0-27.9, adult: Secondary | ICD-10-CM | POA: Diagnosis not present

## 2016-01-26 DIAGNOSIS — K5900 Constipation, unspecified: Secondary | ICD-10-CM | POA: Diagnosis present

## 2016-01-26 DIAGNOSIS — Z993 Dependence on wheelchair: Secondary | ICD-10-CM | POA: Diagnosis not present

## 2016-01-26 DIAGNOSIS — I10 Essential (primary) hypertension: Secondary | ICD-10-CM

## 2016-01-26 NOTE — Progress Notes (Signed)
  Medical Nutrition Therapy:  Appt start time: 830 end time:  1000. Visit # 5  Assessment:  Primary concerns today: lowering her cholesterol and relieving her constipation  Miranda Gaines reports that she is still eating more fruits, vegetables at least one time a day whole grains and lean protein, and constipation better. Happy with bowel movements 1-2 times a week not using laxatives.. She is sedentary, sits in her wheelchair most of the day. Does arm and leg exercises 2x/week. Cannot get transportation to exercise programs. Assisted her with applying for SNAP today.  She has trouble reading.  Learning Readiness: Ready  Change in progress, ANTHROPOMETRICS: weight-162#, BMI-27.- weight continues to slowly decline,  goal weight 150-155#  MEDICATIONS: Daily Nexium- consider using on an as needed basis as this can inhibit absorption of vital nutrients. DIETARY INTAKE: Usual eating pattern includes 3 meals and 2 snacks per day.  24-hr recall suggests daily intake of 1600-1800 kcal:  (Up at  AM) 730 am B ( 730-8 AM)- 1/2 cup dry oatmeal, country crock butter, cinnamon, raisins, non nutritive sugar, thinks it is whole grain toast and boiled egg, bottled water, 1 cup apple juice  Lunch ( 03-1229 AM)- peanut butter sandwich, banana or salad- lettuce tomato cucumber, ginger ale regular, bottled water  Snk ( PM)- fruit  D ( 4-5 PM)-baked [place] legs and [place] wings, chicken legs and wings, bell pepper, onion, celery, sweet potatoes- 2-3 servings   Snk ( PM)- grapes  Typical day? Yes.     Progress Towards Goal(s):  Some progress.   Nutritional Diagnosis:  NB-1.1 Food and nutrition-related knowledge deficit As related to lack of prior adequate training is improving  As evidenced by her report and recall of healthier food choices and gradual weight loss.    Intervention:  Nutrition education about recommendation for exercise frequency. Reviewed produce in season and how to store  Coordination of care: ? Is  there an alternative to Nexium as she is reporting muscle cramps/spasms in her midsection and back  Teaching Method Utilized: Visual. Auditory, Hands on Handouts given during visit include:plate method, AVS, healthy snack options  Barriers to learning/adherence to lifestyle change: support, limited material resources Demonstrated degree of understanding via:  Teach Back   Monitoring/Evaluation:  Dietary intake, exercise, and body weight in 8 week(s)

## 2016-01-26 NOTE — Patient Instructions (Addendum)
Check label on bread to be sure it has at least 3 grams of fiber OR save the bag and bring it to your next visit   Fruits you can Freeze- Bananas- peel and break in half                                        Grapes- after washing put in container/bowl in the freezer                                       Strawberries- after washing                                       Blueberries                                       Peaches  Keep eating at least 1- 2 cup vegetables a day  Buy Teabags instead of soda.   You  Need to do your exercise 3-5 days a week- you can begin by increasing it by 1 times a week   Try to call your case worker to find out where to send the Food Stamps/SNAP application

## 2016-02-22 ENCOUNTER — Other Ambulatory Visit: Payer: Self-pay | Admitting: Internal Medicine

## 2016-02-24 NOTE — Telephone Encounter (Signed)
Patient has clotrimazole cream that she can use

## 2016-03-15 DIAGNOSIS — F332 Major depressive disorder, recurrent severe without psychotic features: Secondary | ICD-10-CM | POA: Diagnosis not present

## 2016-03-21 ENCOUNTER — Other Ambulatory Visit: Payer: Self-pay | Admitting: Internal Medicine

## 2016-03-28 ENCOUNTER — Telehealth: Payer: Self-pay | Admitting: Internal Medicine

## 2016-03-28 NOTE — Telephone Encounter (Signed)
APT. REMINDER CALL, NO ANSWER, NO VOICEMAIL °

## 2016-03-29 ENCOUNTER — Ambulatory Visit (INDEPENDENT_AMBULATORY_CARE_PROVIDER_SITE_OTHER): Payer: Medicare Other | Admitting: Dietician

## 2016-03-29 VITALS — Wt 161.0 lb

## 2016-03-29 DIAGNOSIS — Z713 Dietary counseling and surveillance: Secondary | ICD-10-CM | POA: Diagnosis not present

## 2016-03-29 DIAGNOSIS — Z993 Dependence on wheelchair: Secondary | ICD-10-CM | POA: Diagnosis not present

## 2016-03-29 DIAGNOSIS — E785 Hyperlipidemia, unspecified: Secondary | ICD-10-CM | POA: Diagnosis not present

## 2016-03-29 DIAGNOSIS — Z6827 Body mass index (BMI) 27.0-27.9, adult: Secondary | ICD-10-CM | POA: Diagnosis not present

## 2016-03-29 NOTE — Progress Notes (Signed)
  Medical Nutrition Therapy:  Appt start time: 815 end time:  0935. Visit # 6  Assessment:  Primary concerns today: lowering her cholesterol and relieving her constipation  Miranda ForestShirley reports that she is still eating more fruits, > 1 cup two times a day of vegetables, at least two  whole grains and lean proteins/day, and her constipation is still improved. She had questions about organic foods, and brought empty food containers to discuss. She continues to have ~ 3 BMs/week and continues to not use laxatives.. She was awarded [employer] benefits and is enjoying the added money to buy more healthy foods. She is sedentary, sits in her wheelchair most of the day. Does arm and leg exercises 2x/week. Cannot get transportation to exercise programs.    ANTHROPOMETRICS: weight-161#, BMI-27.- weight continues to slowly decline,  goal weight 150-155#,she says she is not trying to decrease her weight but it must be the healthier choices she is making.   MEDICATIONS: Daily Nexium- consider using on an as needed basis as this can inhibit absorption of vital nutrients. DIETARY INTAKE: Usual eating pattern includes 3 meals and 2 snacks per day.  24-hr recall suggests daily intake of 1600-1800 kcal:  (Up at  AM) 730 am B ( 730-8 AM)- 1/2 cup dry oatmeal, country crock butter, whole grain toast x2 and 1-2 boiled eggs, bottled water, 1/2 c coffee Snack- "nabs":  Lunch ( 03-1229 AM)- fish, carrots, whole grain bread x2, lettuce tomato cucumber, ginger ale regular ~2 times a week, bottled water  Snk ( PM)- fruit  D ( 4-5 PM)-Fish baked- tomato, pintos beans- 2-3 servings   Snk ( PM)- grapes, or other fruit  Typical day? Yes.    Progress Towards Goal(s):  Some progress.   Nutritional Diagnosis:  NB-1.1 Food and nutrition-related knowledge deficit As related to lack of prior adequate training is improving  As evidenced by her report and recall of healthier food choices and gradual weight loss.    Intervention:  Nutrition  education about: chair exercise she can do at home- went through them with her and demonstrated them, contracted for 3 times a week for all exercises, how to decrease exposure to plastics and ways to decrease her sugar/starch intake  Coordination of care: Lipid profile done today and pending  Teaching Method Utilized: Visual. Auditory, Hands on Handouts given during visit include:plate method, AVS, healthy snack options  Barriers to learning/adherence to lifestyle change: support, limited material resources Demonstrated degree of understanding via:  Teach Back   Monitoring/Evaluation:  Dietary intake, exercise, and body weight in 8 week(s)  Plyler, Miranda Gaines, RD 03/29/2016 10:03 AM.

## 2016-03-29 NOTE — Patient Instructions (Addendum)
Good Job- you have cured your constipation and lost 10#.   You do not need to buy organic foods, however if you want to, the following foods may be better for you when you buy organic.   1. Strawberries 2. Apples 3. Nectarines 4. Peaches 5. Celery 6. Grapes 7. Cherries 8. Spinach 9. Tomatoes 10. Sweet Bell Peppers 11. Cherry Tomatoes 12. Cucumbers  Try to limit plas  tic:   drink water from your sink in a glass or cup to stay away from plastic.   Throw away cracked or scratched plastic containers. Recycle them if possible (ask your local recycling program) or put them in garbage. Use glass or unlined stainless steel water bottles. Keep plastic containers labeled with a 1, 2 or 5; they do not contain BPA or other plastic chemicals of concern.  Do the chair exercises  3 days a week. For the next few weeks- push yourself to where you feel a little bit uncomfortable in the stretch but no pain. If you feel pain, decrease your stretch  Or change your position

## 2016-03-30 LAB — LIPID PANEL
Chol/HDL Ratio: 5.7 ratio units — ABNORMAL HIGH (ref 0.0–4.4)
Cholesterol, Total: 211 mg/dL — ABNORMAL HIGH (ref 100–199)
HDL: 37 mg/dL — ABNORMAL LOW (ref >39)
LDL Calculated: 146 mg/dL — ABNORMAL HIGH (ref 0–99)
Triglycerides: 142 mg/dL (ref 0–149)
VLDL Cholesterol Cal: 28 mg/dL (ref 5–40)

## 2016-05-15 ENCOUNTER — Other Ambulatory Visit: Payer: Self-pay | Admitting: Internal Medicine

## 2016-05-15 ENCOUNTER — Other Ambulatory Visit: Payer: Self-pay

## 2016-05-15 NOTE — Telephone Encounter (Signed)
Attempted to contact pt back-no answer, unable to leave message.  Physician Pharmacy was contacted, pt has clotrimazole on her medication profile, but its not covered by her insurance.  Per pharmacy, pt declined to pay the $5.?? otc price and asked that it be put back.  I will attempt to contact pt back to follow up on refill.[name], Darlene Cassady1/22/20183:45 PM

## 2016-05-15 NOTE — Telephone Encounter (Signed)
clotrimazole (LOTRIMIN) 1 % cream, refill request. Please call pt back.

## 2016-05-16 NOTE — Telephone Encounter (Signed)
Patient called because she can not afford to pay 5.00 co pay for clotrimazole cream. Advised patient that  insurance will not pay for medication and she can by OTC. Patient stated she would find it OTC.

## 2016-05-23 ENCOUNTER — Telehealth: Payer: Self-pay | Admitting: Internal Medicine

## 2016-05-23 NOTE — Telephone Encounter (Signed)
APT. REMINDER CALL, LMTCB °

## 2016-05-23 NOTE — Progress Notes (Signed)
    Medical Nutrition Therapy:  Appt start time: 815 end time:  0910. Visit # 7  Assessment:  Primary concerns today: 1- lowering her cholesterol - her lipid profile is improved with HDL increased 3 mg/dl, LDL decreased 39 mg/dl.  2- relieving her constipation - She continues to have ~4 BMs/week and continues to not use laxatives.  Miranda ForestShirley needs increased self confidence in her new eating and exercsie behaviors. She reports that she continues to eat 3-5 servings of fruit per day and  >1 cup two times a day of vegetables, at least two whole grains and lean proteins/day, and [peanut butter daily that unfortunately contains hydrogenated fats that raise LDL cholesterol. Her SNAP benefits were decreased by 8$/month and she is running short by the last week. She is sedentary, sits in her wheelchair most of the day. She does 30 minutes of arm and leg exercises daily now.    ANTHROPOMETRICS: weight- 164.5 #, BMI-27.8,  goal weight 155# +/-5#.   MEDICATIONS: Daily Nexium- consider using on an as needed basis as this can inhibit absorption of vital nutrients. DIETARY INTAKE: Usual eating pattern includes 3 meals and 2 snacks per day.  24-hr recall suggests daily intake of 1700-1800 kcal:  (Up at  AM) 730 am B ( 730-8 AM)- old fashioned oatmeal with a little butter, raisins. cinnamon, milk oatmeal, boiled egg, whole wheat toast 100%, apple juice, water Snack- apple  Lunch ( 03-1229 AM)- fish- whities,baked, slaw, bread, water, prunes Snk ( PM)- fruit- banana   D (4-5 PM)-  leftovers from lunch, cauliflower and brocolli, mixed vegetables Snk ( PM)- fruits Beverages- water, apple juice, grape juice, prune juice, -2 cups Typical day? yes  Progress Towards Goal(s):  Some progress.   Nutritional Diagnosis:  NB-1.1 Food and nutrition-related knowledge deficit As related to lack of prior adequate training is improving  As evidenced by her report and recall of healthier food choices and gradual weight  loss.    Intervention:  Nutrition education about: decreasing calories from juice, maintaining behavior change by getting support from other means. Improvements in lipid profile. How to read a label to detect trans fats. Keep 1 days food record per month x 3 months to help her maintain behavior changes.  Coordination of care: sent message to Dr. Beckie Saltsivet and pharmacist about affordability of  antifungal cream for patient  Teaching Method Utilized: Visual. Auditory, Hands on Handouts given during visit include:plate method, AVS, healthy snack options  Barriers to learning/adherence to lifestyle change: support, limited material resources Demonstrated degree of understanding via:  Teach Back   Monitoring/Evaluation:  Dietary intake, exercise, and body weight in 12 week(s)  Plyler, Miranda Gaines, RD 05/24/2016 9:37 AM.  .

## 2016-05-24 ENCOUNTER — Encounter: Payer: Self-pay | Admitting: Dietician

## 2016-05-24 ENCOUNTER — Ambulatory Visit (INDEPENDENT_AMBULATORY_CARE_PROVIDER_SITE_OTHER): Payer: Medicare Other | Admitting: Dietician

## 2016-05-24 ENCOUNTER — Telehealth: Payer: Self-pay | Admitting: Pharmacist

## 2016-05-24 DIAGNOSIS — K5900 Constipation, unspecified: Secondary | ICD-10-CM

## 2016-05-24 DIAGNOSIS — Z713 Dietary counseling and surveillance: Secondary | ICD-10-CM | POA: Diagnosis not present

## 2016-05-24 DIAGNOSIS — E785 Hyperlipidemia, unspecified: Secondary | ICD-10-CM

## 2016-05-24 NOTE — Patient Instructions (Addendum)
Your weight may be increased. Next  Visit try not to wear your thermals.   Put water in your juice to help you limit juice to 1 cup each day.  Please complete one food record each month.   One in February, one in March and one in April.   Bring your food records with you to your next visit.   Please call me anytime with questions or for support.

## 2016-05-30 NOTE — Progress Notes (Signed)
Tried contacting patient on cost of clotrimazole. Unable to reach

## 2016-06-06 DIAGNOSIS — F332 Major depressive disorder, recurrent severe without psychotic features: Secondary | ICD-10-CM | POA: Diagnosis not present

## 2016-06-12 ENCOUNTER — Other Ambulatory Visit: Payer: Self-pay | Admitting: Internal Medicine

## 2016-06-14 NOTE — Telephone Encounter (Signed)
Needs future appointment scheduled. Ok for appointment to be in April/May

## 2016-06-26 ENCOUNTER — Telehealth: Payer: Self-pay | Admitting: Internal Medicine

## 2016-06-26 NOTE — Telephone Encounter (Signed)
APT. REMINDER CALL, NO ANSWER, NO VOICEMAIL °

## 2016-06-27 ENCOUNTER — Ambulatory Visit (INDEPENDENT_AMBULATORY_CARE_PROVIDER_SITE_OTHER): Payer: Medicare Other | Admitting: Internal Medicine

## 2016-06-27 ENCOUNTER — Other Ambulatory Visit: Payer: Self-pay | Admitting: Pharmacist

## 2016-06-27 ENCOUNTER — Encounter: Payer: Self-pay | Admitting: Internal Medicine

## 2016-06-27 VITALS — BP 148/76 | HR 74 | Temp 97.9°F | Wt 161.9 lb

## 2016-06-27 DIAGNOSIS — J329 Chronic sinusitis, unspecified: Secondary | ICD-10-CM | POA: Diagnosis not present

## 2016-06-27 DIAGNOSIS — Z87891 Personal history of nicotine dependence: Secondary | ICD-10-CM | POA: Diagnosis not present

## 2016-06-27 DIAGNOSIS — I10 Essential (primary) hypertension: Secondary | ICD-10-CM

## 2016-06-27 DIAGNOSIS — B9789 Other viral agents as the cause of diseases classified elsewhere: Secondary | ICD-10-CM | POA: Diagnosis not present

## 2016-06-27 DIAGNOSIS — Z79899 Other long term (current) drug therapy: Secondary | ICD-10-CM

## 2016-06-27 MED ORDER — SALINE SPRAY 0.65 % NA SOLN: 1.0000 | NASAL | 5 refills | Status: DC | PRN

## 2016-06-27 MED ORDER — FLUTICASONE PROPIONATE 50 MCG/ACT NA SUSP: 1.0000 | Freq: Every day | NASAL | 5 refills | Status: DC

## 2016-06-27 MED ORDER — PSEUDOEPHEDRINE HCL 30 MG PO TABS: 30.0000 mg | ORAL_TABLET | Freq: Four times a day (QID) | ORAL | 0 refills | Status: DC | PRN

## 2016-06-27 NOTE — Patient Instructions (Addendum)
For your sinuses, we gave you nasacort (similar to your flonase spray) to use right now. I have also sent in to your pharmacy Flonase to have in the future - it is covered by your insurance. Salt water spray is also good to help with the swelling.   Our pharmacist gave you a decongestant syrup and an antihistamine (take the antihistamine at night as it can make you sleepy).  Continue the hot towels, tea and lozenges as these seem to be helping in draining your sinuses. Hot baths and showers will also do the same.   If you are not getting any better, start feeling worse, get fevers or worsening cough, please come back to the clinic.

## 2016-06-27 NOTE — Progress Notes (Signed)
CC: congestion  HPI:  [name] is a 61 y.o. with a PMH of HTN, HLD, DJD, presenting to clinic with congestion.   Patient states she began having rhinorrhea and post-nasal drainage one week ago; this has progressed to congestion. She states that she has used flonase last week but has run out of it. Her congestion is relieved by using a hot towel on her face and hot tea; this allows drainage but congestion does return before long. Her nasal drainage is mostly clear with some green mucous. Patient endorses non productive cough, as well as sinus pressure, and itchy eyes. She denies fevers, chills, nausea, vomiting.   Please see problem based Assessment and Plan for status of patients chronic conditions.  Past Medical History:  Diagnosis Date  . Degenerative disc disease, cervical    L spine 10/09 showing DJD and cervical spurring but no specific foraminal narrowing. Has been followed by Rochele Pages, pain management physician in [place].  . Depression    f/b Dr. Cardell Peach at [facility]  . GERD (gastroesophageal reflux disease)   . Herniated disc    L5-S1. s/p laminectomy in 1993 by Dr. Shelle Iron.  MRI June 2005 showing L4/5 disc bulge + mild facet arthropathy. F/b Dr. Marzetta Board for pain management. Wheel chair bound.  . Hyperlipidemia   . Hypertension   . Pyloric stenosis    s/p EGD with balloon dilatation on 06/23/2007 by Dr. Elnoria Howard  . Seborrheic dermatitis    of face, sees Dr. Merlyn Albert Luptom, dermatop cream TID  . Tobacco abuse    Did not tolerate Chantix (vomiting)  . Wears hearing aid    In r. ear, followed by ENT. 2/2 recurrent otitis    Review of Systems:   Review of Systems  Constitutional: Negative for chills, diaphoresis, fever and malaise/fatigue.  HENT: Positive for congestion and sinus pain. Negative for ear discharge, hearing loss, sore throat and tinnitus.   Eyes: Negative for blurred vision, double vision and photophobia.  Respiratory: Positive for cough.  Negative for hemoptysis, sputum production, shortness of breath and wheezing.   Gastrointestinal: Negative for abdominal pain, constipation, diarrhea, nausea and vomiting.  Neurological: Negative for dizziness and headaches.    Physical Exam:  Vitals:   06/27/16 0853  BP: (!) 147/78  Pulse: 74  Temp: 97.9 F (36.6 C)  TempSrc: Oral  SpO2: 100%   Physical Exam  Constitutional: She is oriented to person, place, and time. She appears well-developed and well-nourished. No distress.  HENT:  Head: Normocephalic and atraumatic.  Right Ear: External ear normal.  Left Ear: External ear normal.  Nose: Nose normal.  Mouth/Throat: Oropharynx is clear and moist. No oropharyngeal exudate.  Eyes: Conjunctivae and EOM are normal.  Neck: Normal range of motion. Neck supple.  Cardiovascular: Normal rate, regular rhythm, normal heart sounds and intact distal pulses.  Exam reveals no gallop and no friction rub.   No murmur heard. Pulmonary/Chest: Effort normal and breath sounds normal. She has no wheezes. She has no rales.  Abdominal: Soft. Bowel sounds are normal. She exhibits no distension. There is no tenderness.  Lymphadenopathy:    She has no cervical adenopathy.  Neurological: She is alert and oriented to person, place, and time. No cranial nerve deficit.  Skin: Skin is warm and dry. Capillary refill takes less than 2 seconds. No rash noted. She is not diaphoretic. No erythema.    Assessment & Plan:   See Encounters Tab for problem based charting.  Patient discussed with Dr. Cecilie KicksKlima   Oluwatosin Higginson, MD Internal Medicine PGY1

## 2016-06-28 NOTE — Assessment & Plan Note (Signed)
Patient found to be mildly hypertensive to 140's SBP in clinic today, initially and on recheck. Likely due to acute illness.  Plan: --continue current regimen --continue monitoring for need for change in therapy at f/u visits

## 2016-06-28 NOTE — Progress Notes (Signed)
Case discussed with Dr. Svalina at the time of the visit.  We reviewed the resident's history and exam and pertinent patient test results.  I agree with the assessment, diagnosis and plan of care documented in the resident's note. 

## 2016-06-28 NOTE — Assessment & Plan Note (Signed)
Patient with signs and symptoms of viral sinusitis. Patient with limited means and not able to afford OTC medications at this time and has to use mail in pharmacy for prescribed meds. Our pharmacist provided patient with nasocort, dayquill, and niquill for acute, symptomatic treatment. I do not think that at this time antibiotics are warranted.   Plan: --pt provided with nasacort, dayquill, niquill --scripts for loratidine, and flonase sent to her pharmacy --encouraged continued use of tea and hot towels as these seem to be providing relief  --pt advised to return to clinic if her symptoms are not improving, are getting worse, she develops a cough, more purulent drainage

## 2016-08-08 ENCOUNTER — Encounter: Payer: Self-pay | Admitting: Dietician

## 2016-08-08 ENCOUNTER — Encounter: Payer: Self-pay | Admitting: Internal Medicine

## 2016-08-08 ENCOUNTER — Ambulatory Visit: Payer: Medicare Other | Admitting: Dietician

## 2016-08-08 ENCOUNTER — Encounter: Payer: Medicare Other | Admitting: Internal Medicine

## 2016-08-08 ENCOUNTER — Ambulatory Visit (INDEPENDENT_AMBULATORY_CARE_PROVIDER_SITE_OTHER): Payer: Medicare HMO | Admitting: Internal Medicine

## 2016-08-08 VITALS — BP 139/67 | HR 59 | Temp 98.5°F | Resp 20 | Wt 168.5 lb

## 2016-08-08 DIAGNOSIS — L989 Disorder of the skin and subcutaneous tissue, unspecified: Secondary | ICD-10-CM

## 2016-08-08 DIAGNOSIS — M545 Low back pain, unspecified: Secondary | ICD-10-CM

## 2016-08-08 DIAGNOSIS — J329 Chronic sinusitis, unspecified: Secondary | ICD-10-CM

## 2016-08-08 DIAGNOSIS — G8929 Other chronic pain: Secondary | ICD-10-CM | POA: Diagnosis not present

## 2016-08-08 DIAGNOSIS — Z87891 Personal history of nicotine dependence: Secondary | ICD-10-CM | POA: Diagnosis not present

## 2016-08-08 DIAGNOSIS — E785 Hyperlipidemia, unspecified: Secondary | ICD-10-CM | POA: Diagnosis not present

## 2016-08-08 DIAGNOSIS — F32A Depression, unspecified: Secondary | ICD-10-CM

## 2016-08-08 DIAGNOSIS — E669 Obesity, unspecified: Secondary | ICD-10-CM

## 2016-08-08 DIAGNOSIS — M25562 Pain in left knee: Secondary | ICD-10-CM | POA: Diagnosis not present

## 2016-08-08 DIAGNOSIS — Z0000 Encounter for general adult medical examination without abnormal findings: Secondary | ICD-10-CM

## 2016-08-08 DIAGNOSIS — I10 Essential (primary) hypertension: Secondary | ICD-10-CM

## 2016-08-08 DIAGNOSIS — Z79899 Other long term (current) drug therapy: Secondary | ICD-10-CM | POA: Diagnosis not present

## 2016-08-08 DIAGNOSIS — M25561 Pain in right knee: Secondary | ICD-10-CM

## 2016-08-08 DIAGNOSIS — K219 Gastro-esophageal reflux disease without esophagitis: Secondary | ICD-10-CM

## 2016-08-08 DIAGNOSIS — L219 Seborrheic dermatitis, unspecified: Secondary | ICD-10-CM | POA: Diagnosis not present

## 2016-08-08 DIAGNOSIS — F329 Major depressive disorder, single episode, unspecified: Secondary | ICD-10-CM | POA: Diagnosis not present

## 2016-08-08 DIAGNOSIS — B353 Tinea pedis: Secondary | ICD-10-CM

## 2016-08-08 MED ORDER — ATORVASTATIN CALCIUM 80 MG PO TABS: ORAL_TABLET | ORAL | 11 refills | Status: DC

## 2016-08-08 MED ORDER — HYDROCORTISONE BUTYRATE 0.1 % EX CREA: 1.0000 "application " | TOPICAL_CREAM | Freq: Every day | CUTANEOUS | 5 refills | Status: DC

## 2016-08-08 MED ORDER — CLOTRIMAZOLE 1 % EX CREA: TOPICAL_CREAM | Freq: Two times a day (BID) | CUTANEOUS | 5 refills | Status: DC

## 2016-08-08 NOTE — Progress Notes (Signed)
Patient had recent power outage and wanted a list of non- perishable foods that she can keep on hand in case this happens again. List was provided for her today.

## 2016-08-08 NOTE — Patient Instructions (Addendum)
General Instructions: - Use Clotrimazole cream twice daily on your foot - Refills made on medications - Follow up in 6 months  Please bring your medicines with you each time you come to clinic.  Medicines may include prescription medications, over-the-counter medications, herbal remedies, eye drops, vitamins, or other pills.   Progress Toward Treatment Goals:  Treatment Goal 08/10/2014  Blood pressure deteriorated    Self Care Goals & Plans:  Self Care Goal 12/28/2015  Manage my medications take my medicines as prescribed; bring my medications to every visit; refill my medications on time  Monitor my health keep track of my weight; check my feet daily  Eat healthy foods drink diet soda or water instead of juice or soda; eat more vegetables; eat foods that are low in salt; eat baked foods instead of fried foods; eat fruit for snacks and desserts  Be physically active find an activity I enjoy    No flowsheet data found.   Care Management & Community Referrals:  Referral 08/13/2013  Referrals made for care management support none needed  Referrals made to community resources none

## 2016-08-08 NOTE — Progress Notes (Signed)
CC: Follow up for hypertension  HPI:  [name] is a 62 y.o. woman with PMHx as noted below who presents today for follow up of her hypertension.  HTN: BP 139/66. She is taking Amlodipine 10 mg daily.  Chronic Bilateral Knee Pain: Reports she has not had much pain in her knees lately. She has used Biogel topical in the past but has not needed this.   Low Back Pain: Reports she follows with Dr. Tresa Endo who is a back specialist. She is prescribed Flexeril from Dr. Tresa Endo which alleviates her back pain. She denies any weakness in her legs, saddle anesthesia, or loss of bladder/bowel function.  GERD: Reports occasionally getting heartburn. This is relieved by Nexium 40 mg daily. She denies any dysphagia, odynophagia, or regurgitation of food.  Depression: Reports she is still following with her mental health provider. She is prescribed Nortriptyline and Trazodone by this provider. She reports her depression is stable. She denies any SI.   Hyperlipidemia: She is taking Atorvastatin 80 mg daily and tolerating this medication well. She denies any muscle cramps.   Seborrheic Dermatitis: Reports her dermatitis is well controlled with topical hydrocortisone for her face and clotrimazole cream for her scalp.   Seasonal Allergies: Reports she has been using Claritin and Flonase which are helping her symptoms or rhinorrhea and itchy eyes.   Fungal Infection on Right Foot: Reports she was unable to get the Tolnaftate as her insurance would not cover this medication. She picked up a topical cream from the dollar store that has antifungal properties but this has not helped any. She denies any open sores. She reports the area on her foot itches sometimes.  Past Medical History:  Diagnosis Date  . Degenerative disc disease, cervical    L spine 10/09 showing DJD and cervical spurring but no specific foraminal narrowing. Has been followed by Rochele Pages, pain management physician in [place].  .  Depression    f/b Dr. Cardell Peach at [facility]  . GERD (gastroesophageal reflux disease)   . Herniated disc    L5-S1. s/p laminectomy in 1993 by Dr. Shelle Iron.  MRI June 2005 showing L4/5 disc bulge + mild facet arthropathy. F/b Dr. Marzetta Board for pain management. Wheel chair bound.  . Hyperlipidemia   . Hypertension   . Pyloric stenosis    s/p EGD with balloon dilatation on 06/23/2007 by Dr. Elnoria Howard  . Seborrheic dermatitis    of face, sees Dr. Merlyn Albert Luptom, dermatop cream TID  . Tobacco abuse    Did not tolerate Chantix (vomiting)  . Wears hearing aid    In r. ear, followed by ENT. 2/2 recurrent otitis    Review of Systems:   General: Denies fever, chills, night sweats, changes in weight, changes in appetite HEENT: Denies headaches, ear pain, changes in vision, sore throat CV: Denies CP, palpitations, SOB, orthopnea Pulm: Denies SOB, cough, wheezing GI: Denies abdominal pain, nausea, vomiting, diarrhea, constipation, melena, hematochezia GU: Denies dysuria, hematuria, frequency Msk: See HPI Neuro: Denies numbness, tingling Skin: Denies bruising Psych: Denies anxiety, hallucinations  Physical Exam:  Vitals:   08/08/16 1509  BP: 139/67  Pulse: (!) 59  Resp: 20  Temp: 98.5 F (36.9 C)  TempSrc: Oral  SpO2: 100%  Weight: 168 lb 8 oz (76.4 kg)   General: Elderly woman in NAD HEENT: EOMI, sclera anicteric, mucus membranes moist CV: RRR, no m/g/r Pulm: CTA bilaterally, breaths non-labored Ext: no peripheral edema, moves all extremities Neuro: alert and oriented x  3 Skin: There is a patch of hyperpigmented lesions on the inner right foot near the heel. Appears woody. No open sores.  Assessment & Plan:   See Encounters Tab for problem based charting.  Patient discussed with Dr. Rogelia Boga

## 2016-08-09 ENCOUNTER — Other Ambulatory Visit: Payer: Self-pay | Admitting: Internal Medicine

## 2016-08-09 DIAGNOSIS — L309 Dermatitis, unspecified: Secondary | ICD-10-CM | POA: Insufficient documentation

## 2016-08-09 DIAGNOSIS — L219 Seborrheic dermatitis, unspecified: Secondary | ICD-10-CM

## 2016-08-09 MED ORDER — TRIAMCINOLONE ACETONIDE 0.025 % EX CREA: 1.0000 "application " | TOPICAL_CREAM | Freq: Two times a day (BID) | CUTANEOUS | 2 refills | Status: DC | PRN

## 2016-08-09 NOTE — Assessment & Plan Note (Signed)
Symptoms are controlled on Flonase and Claritin.

## 2016-08-09 NOTE — Assessment & Plan Note (Signed)
Well controlled, PHQ-2 score is 0 today. She is being managed by a mental health provider.

## 2016-08-09 NOTE — Assessment & Plan Note (Addendum)
Doing well on her current regimen. Will refill her hydrocortisone and clotrimazole creams. Advised her to avoid prolonged use of the hydrocortisone cream on her face as this can cause skin thinning.

## 2016-08-09 NOTE — Assessment & Plan Note (Signed)
Stable. Will have her continue Atorvastatin 80 mg daily.

## 2016-08-09 NOTE — Assessment & Plan Note (Signed)
Appears to be a fungal infection on her foot as it is "woody" in appearance. Advised patient to try Clotrimazole on her foot twice daily. Recommended to return to clinic if she is seeing no improvement.

## 2016-08-09 NOTE — Assessment & Plan Note (Signed)
This is no longer an issue for her. Can consider restarting topical biogel if her pain returns.

## 2016-08-09 NOTE — Assessment & Plan Note (Signed)
Her low back pain is being managed by a pain specialist. She is taking Flexeril which alleviates her pain.

## 2016-08-09 NOTE — Assessment & Plan Note (Signed)
BP acceptable. Will have her continue Amlodipine 10 mg daily.

## 2016-08-09 NOTE — Assessment & Plan Note (Signed)
Controlled on Nexium 40 mg daily.

## 2016-08-10 NOTE — Progress Notes (Signed)
Internal Medicine Clinic Attending  Case discussed with Dr. Rivet soon after the resident saw the patient.  We reviewed the resident's history and exam and pertinent patient test results.  I agree with the assessment, diagnosis, and plan of care documented in the resident's note.  

## 2016-08-22 ENCOUNTER — Encounter: Payer: Medicare Other | Admitting: Internal Medicine

## 2016-08-22 ENCOUNTER — Ambulatory Visit: Payer: Medicare Other | Admitting: Dietician

## 2016-08-30 DIAGNOSIS — F332 Major depressive disorder, recurrent severe without psychotic features: Secondary | ICD-10-CM | POA: Diagnosis not present

## 2016-09-26 DIAGNOSIS — M5030 Other cervical disc degeneration, unspecified cervical region: Secondary | ICD-10-CM | POA: Diagnosis not present

## 2016-09-26 DIAGNOSIS — M5416 Radiculopathy, lumbar region: Secondary | ICD-10-CM | POA: Diagnosis not present

## 2016-09-26 DIAGNOSIS — M961 Postlaminectomy syndrome, not elsewhere classified: Secondary | ICD-10-CM | POA: Diagnosis not present

## 2016-09-26 DIAGNOSIS — M4727 Other spondylosis with radiculopathy, lumbosacral region: Secondary | ICD-10-CM | POA: Diagnosis not present

## 2016-10-03 ENCOUNTER — Encounter: Payer: Self-pay | Admitting: *Deleted

## 2016-10-14 IMAGING — CT CT ABD-PELV W/ CM
2 of 5 series · 16 of 46 positions shown, 18 images · IV contrast (APPLIED)
Comparison: None.

CLINICAL DATA: Lower abdominal pain for 6 days.

EXAM:
CT ABDOMEN AND PELVIS WITH CONTRAST
TECHNIQUE: Multidetector CT imaging of the abdomen and pelvis was performed
using the standard protocol following bolus administration of
intravenous contrast.
CONTRAST:  100mL OMNIPAQUE IOHEXOL 300 MG/ML  SOLN

[Series 2: abd/ pelvis 5.0 i30f 1 · axial · 0.71mm/px · z∈[+928,+1328]mm · 13 of 90 slices shown, 15 images]
[im 5/90  soft-tissue]
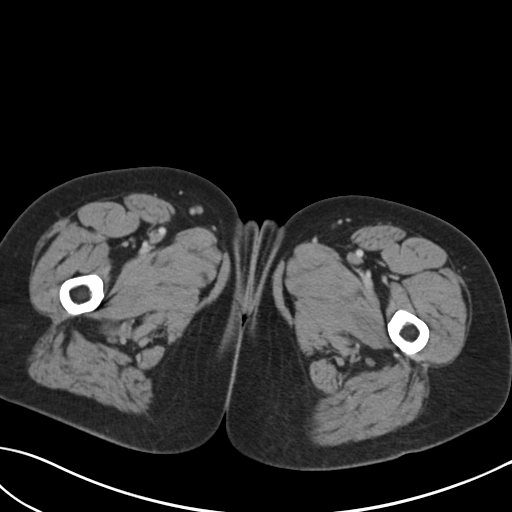
[im 5/90  bone]
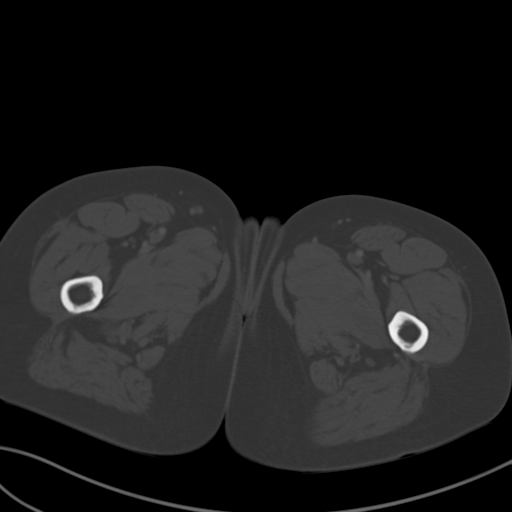
[im 14/90  soft-tissue]
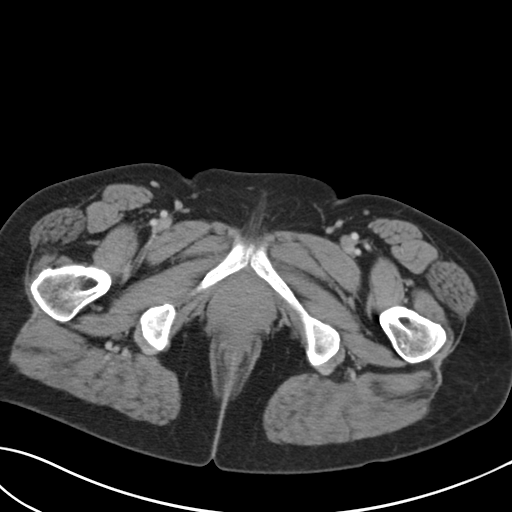
[im 18/90  soft-tissue]
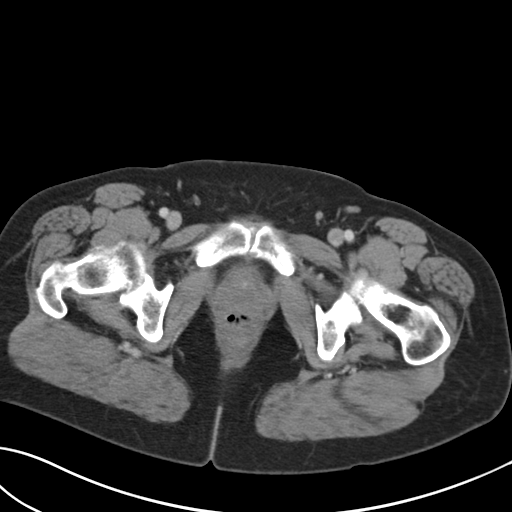
[im 27/90  soft-tissue]
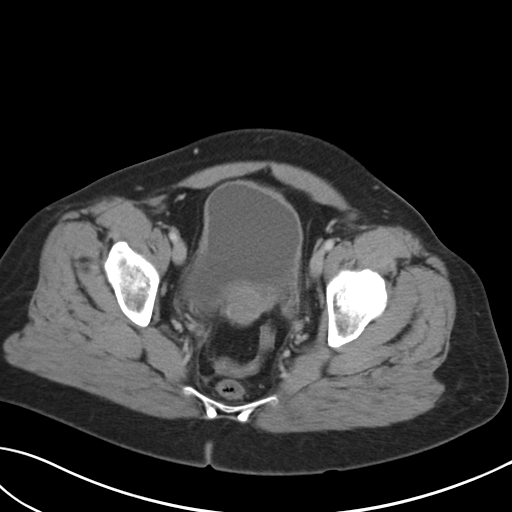
[im 32/90  soft-tissue]
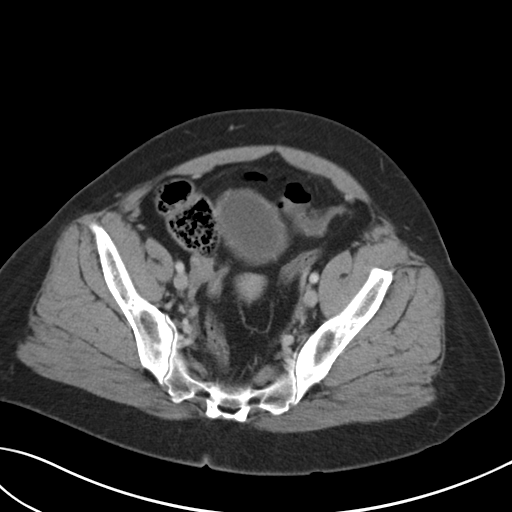
[im 41/90  soft-tissue]
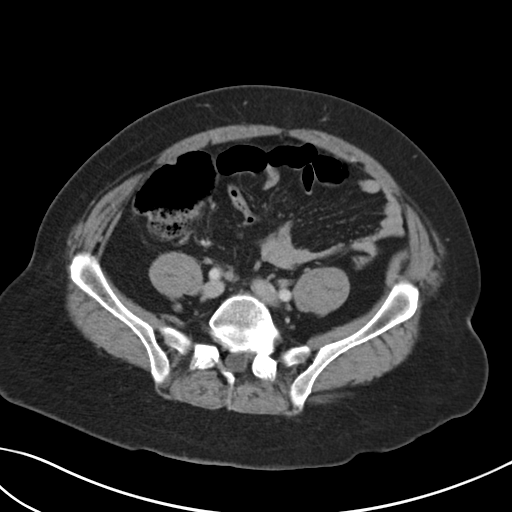
[im 45/90  soft-tissue]
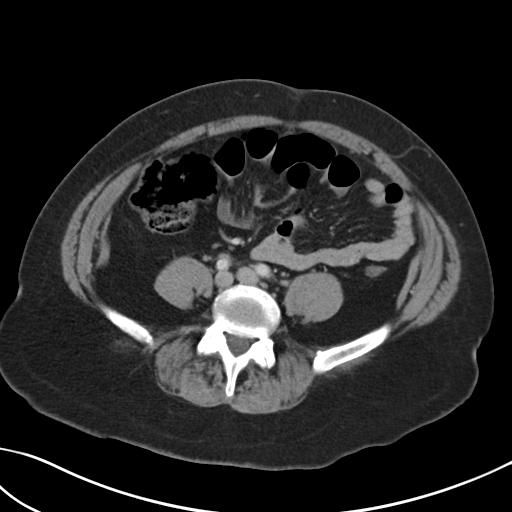
[im 49/90  soft-tissue]
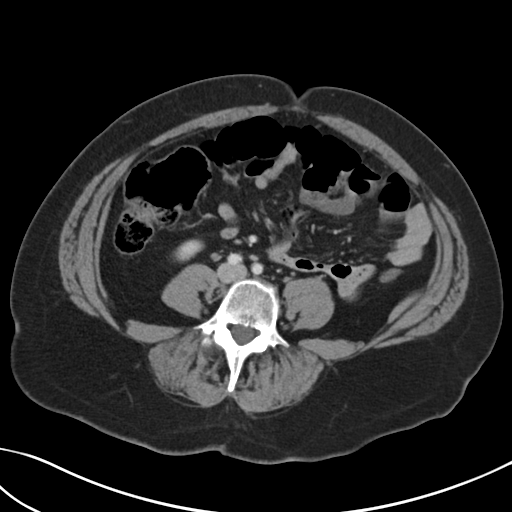
[im 58/90  soft-tissue]
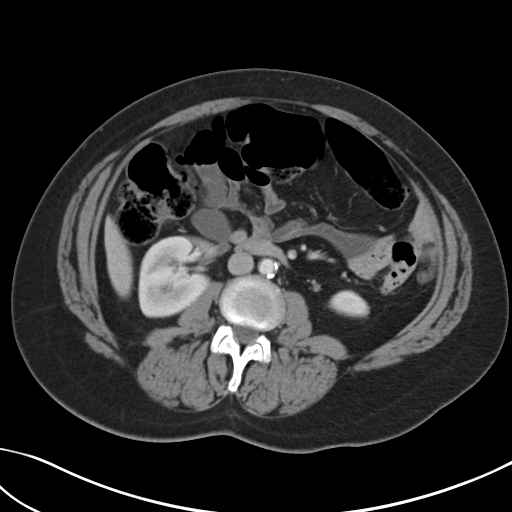
[im 58/90  bone]
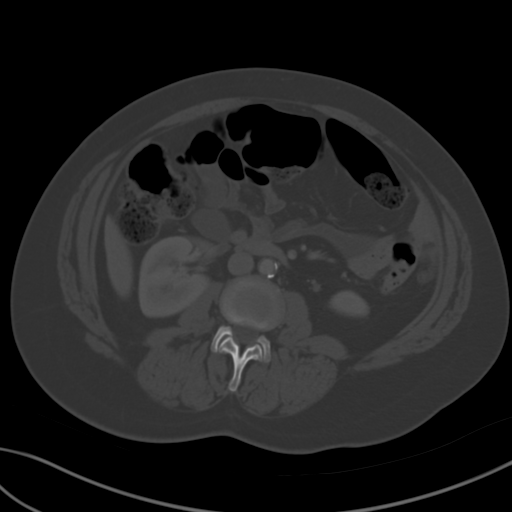
[im 63/90  soft-tissue]
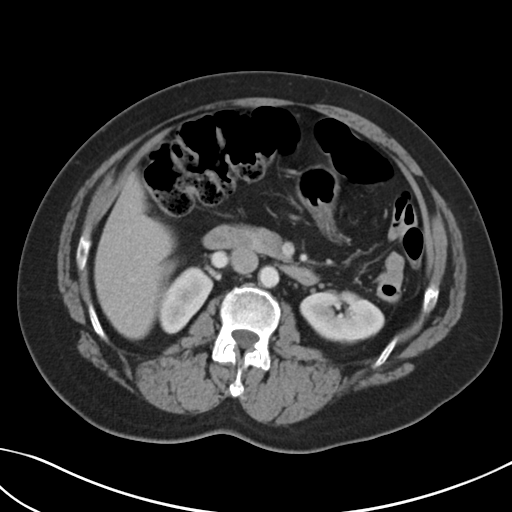
[im 72/90  soft-tissue]
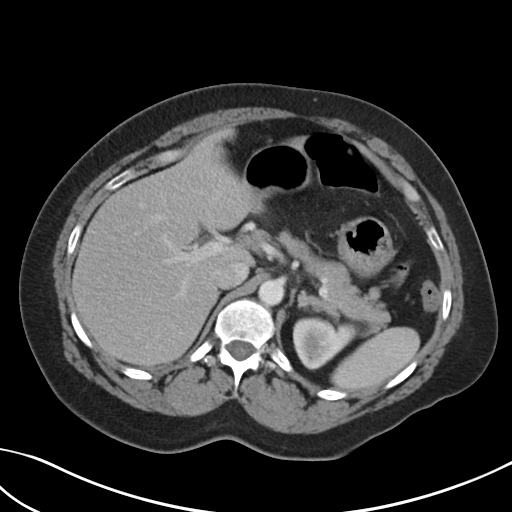
[im 76/90  soft-tissue]
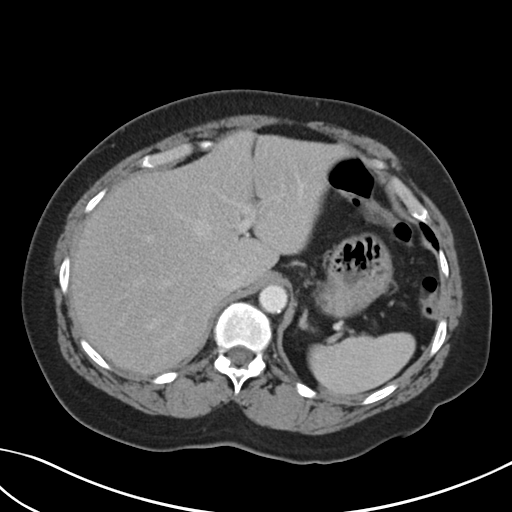
[im 85/90  soft-tissue]
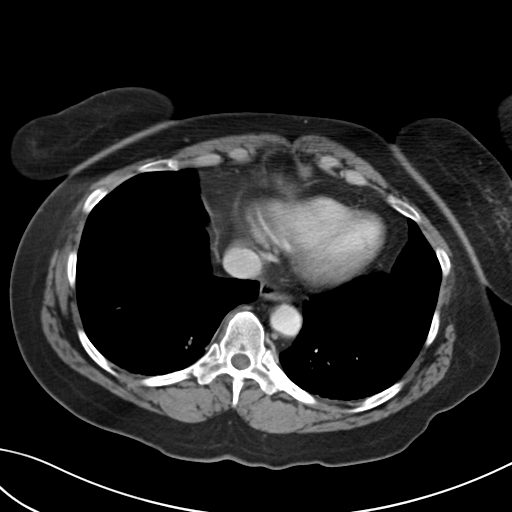

[Series 5: coronal soft tissue · coronal · 0.75mm/px · 3 of 91 slices shown]
[im 31/91  soft-tissue]
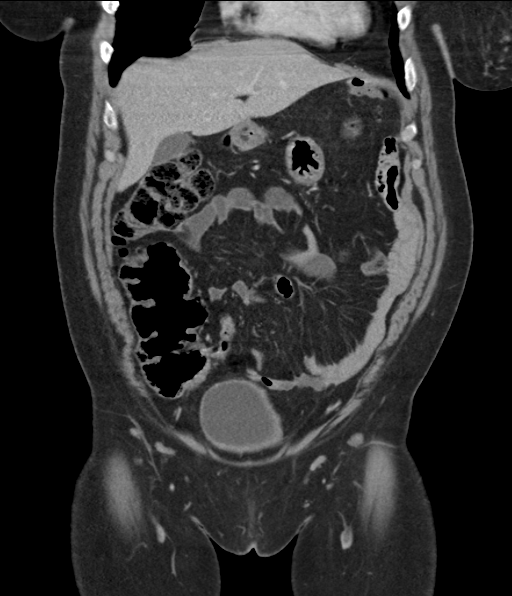
[im 41/91  soft-tissue]
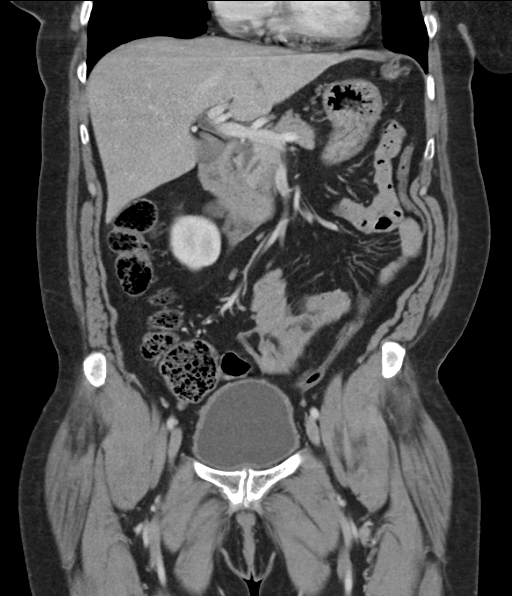
[im 51/91  soft-tissue]
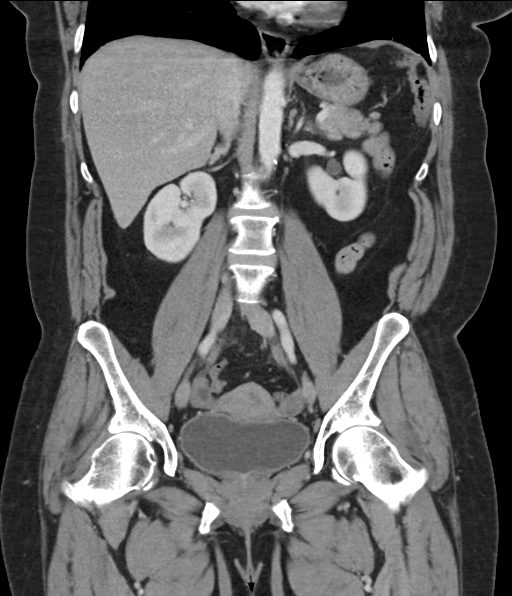

[16 of 46 positions shown; findings below may reference images not displayed]

FINDINGS: Lower chest: The lung bases are clear of acute process. No pleural
effusion or pulmonary lesions. The heart is normal in size. No
pericardial effusion. The distal esophagus and aorta are
unremarkable.

Hepatobiliary: No focal hepatic lesions or intrahepatic biliary
dilatation. The gallbladder is normal. No common bile duct of these.

Pancreas: No mass, inflammation or ductal dilatation.

Spleen: Normal size.  No focal lesions.

Adrenals/Urinary Tract: The adrenal glands and kidneys are
unremarkable.

Stomach/Bowel: The stomach, duodenum, small bowel and colon are
unremarkable. No inflammatory changes, mass lesions or obstructive
findings. The terminal ileum is normal. The appendix is normal.

Vascular/Lymphatic: No mesenteric or retroperitoneal mass or
adenopathy. Moderate atherosclerotic calcifications involving the
aorta and branch vessels.

Reproductive: The uterus and ovaries are normal.

Other: The bladder is normal. No pelvic mass or adenopathy. No
significant free pelvic fluid collections. No inguinal mass or
adenopathy.

Musculoskeletal: No significant bony findings. Moderate degenerative
disc disease at L5-S1.
IMPRESSION: 1. No acute abdominal/pelvic findings, mass lesions or adenopathy.
2. Moderate atherosclerotic calcifications involving aorta.
3. Normal appearance of the kidneys, ureters and bladder.

## 2016-10-20 ENCOUNTER — Other Ambulatory Visit: Payer: Self-pay | Admitting: Internal Medicine

## 2016-10-27 ENCOUNTER — Encounter: Payer: Medicare HMO | Admitting: Internal Medicine

## 2016-11-03 ENCOUNTER — Ambulatory Visit (INDEPENDENT_AMBULATORY_CARE_PROVIDER_SITE_OTHER): Payer: Medicare Other | Admitting: Internal Medicine

## 2016-11-03 ENCOUNTER — Encounter: Payer: Self-pay | Admitting: Internal Medicine

## 2016-11-03 VITALS — BP 126/73 | HR 74 | Temp 98.4°F | Wt 164.6 lb

## 2016-11-03 DIAGNOSIS — Z87891 Personal history of nicotine dependence: Secondary | ICD-10-CM | POA: Diagnosis not present

## 2016-11-03 DIAGNOSIS — Z993 Dependence on wheelchair: Secondary | ICD-10-CM | POA: Diagnosis not present

## 2016-11-03 DIAGNOSIS — Z7409 Other reduced mobility: Secondary | ICD-10-CM | POA: Diagnosis not present

## 2016-11-03 DIAGNOSIS — Z026 Encounter for examination for insurance purposes: Secondary | ICD-10-CM | POA: Diagnosis present

## 2016-11-03 HISTORY — DX: Other reduced mobility: Z74.09

## 2016-11-03 NOTE — Assessment & Plan Note (Signed)
The pt is here today for a mobility examination for a power wheelchair. Her mobility is limited due to chronic spinal issues and is chronically wheelchair bound. Her pain and functional status have also likely led to deconditioning and weakness. On exam, her extremity strength is poor and precludes the use of a cane, walker, or other device for ambulation assistance or for a manual wheelchair which would require better UE strength. A scooter would also be inappropriate due to the strength and mobility required with transferring which the patient lacks. Her physical state is appropriate for a power chair and she retains the mental capacity to safely operate it.

## 2016-11-03 NOTE — Patient Instructions (Signed)
It was very nice to meet you Miranda Gaines.   We've completed your mobility assessment today, so the process of getting a new power chair for you can keep moving. I'm glad you're doing well otherwise and will see you at your next check up.

## 2016-11-03 NOTE — Progress Notes (Signed)
   CC: Mobility Assessment  HPI:  Ms.Tinamarie Armida SansM Adolph is a 61 y.o. F with past medical history as detailed below who presents to the clinic for a mobility assessment for a power wheelchair.   Ms. Sherrie MustacheFisher has a long history of multiple spinal issues s/p surgery, currently being followed by a pain management clinic. She has been wheelchair bound for many years and is unable to ambulate on her own or perform various ADLs such as walking, dressing, bathing, transferring, and home maintenance activities. She was previously using a power wheelchair, however, it broke down after about 5 years.   Past Medical History:  Diagnosis Date  . Degenerative disc disease, cervical    L spine 10/09 showing DJD and cervical spurring but no specific foraminal narrowing. Has been followed by Rochele PagesJanie Kelly, pain management physician in [place].  . Depression    [facility]  . GERD (gastroesophageal reflux disease)   . Herniated disc    L5-S1. s/p laminectomy in 1993 by Dr. Shelle IronBeane.  MRI June 2005 showing L4/5 disc bulge + mild facet arthropathy. F/b Dr. Marzetta BoardJane Keilitz for pain management. Wheel chair bound.  . Hyperlipidemia   . Hypertension   . Pyloric stenosis    s/p EGD with balloon dilatation on 06/23/2007 by Dr. Elnoria HowardHung  . Seborrheic dermatitis    of face, sees Dr. Merlyn AlbertFred Luptom, dermatop cream TID  . Tobacco abuse    Did not tolerate Chantix (vomiting)  . Wears hearing aid    In r. ear, followed by ENT. 2/2 recurrent otitis   Review of Systems:  Review of Systems  Constitutional: Negative for chills and fever.  Cardiovascular: Negative for chest pain.  Musculoskeletal: Positive for back pain.     Physical Exam:  Vitals:   11/03/16 1514  BP: 126/73  Pulse: 74  Temp: 98.4 F (36.9 C)  TempSrc: Oral  SpO2: 100%  Weight: 164 lb 9.6 oz (74.7 kg)  Height 5' 4.5"  Physical Exam  Musculoskeletal:  Tenderness to palpation of medial forearms around the elbow, 3/5 strength in  bilateral UEs, 3/5 strength in RLE and 2/5 strength in LLE, brace in place on LLE    Assessment & Plan:   See Encounters Tab for problem based charting.  Patient seen with Dr. Heide SparkNarendra

## 2016-11-07 NOTE — Addendum Note (Signed)
Addended by: Earl LagosNARENDRA, Lanae Federer on: 11/07/2016 09:19 AM   Modules accepted: Level of Service

## 2016-11-07 NOTE — Progress Notes (Signed)
Internal Medicine Clinic Attending  I saw and evaluated the patient.  I personally confirmed the key portions of the history and exam documented by Dr. Harden and I reviewed pertinent patient test results.  The assessment, diagnosis, and plan were formulated together and I agree with the documentation in the resident's note.  

## 2016-12-04 ENCOUNTER — Other Ambulatory Visit: Payer: Self-pay | Admitting: *Deleted

## 2016-12-04 DIAGNOSIS — F332 Major depressive disorder, recurrent severe without psychotic features: Secondary | ICD-10-CM | POA: Diagnosis not present

## 2016-12-04 MED ORDER — LORATADINE 10 MG PO TABS: 10.0000 mg | ORAL_TABLET | Freq: Every day | ORAL | 5 refills | Status: DC

## 2016-12-22 ENCOUNTER — Other Ambulatory Visit: Payer: Self-pay | Admitting: *Deleted

## 2016-12-22 MED ORDER — ESOMEPRAZOLE MAGNESIUM 40 MG PO CPDR: 40.0000 mg | DELAYED_RELEASE_CAPSULE | Freq: Every day | ORAL | 3 refills | Status: DC

## 2016-12-28 ENCOUNTER — Other Ambulatory Visit: Payer: Self-pay | Admitting: Internal Medicine

## 2017-01-18 ENCOUNTER — Other Ambulatory Visit: Payer: Self-pay | Admitting: Internal Medicine

## 2017-01-22 ENCOUNTER — Other Ambulatory Visit: Payer: Self-pay | Admitting: *Deleted

## 2017-01-22 MED ORDER — TRIAMCINOLONE ACETONIDE 0.025 % EX CREA: TOPICAL_CREAM | CUTANEOUS | 2 refills | Status: DC

## 2017-01-23 ENCOUNTER — Other Ambulatory Visit: Payer: Self-pay | Admitting: Internal Medicine

## 2017-01-23 DIAGNOSIS — B9789 Other viral agents as the cause of diseases classified elsewhere: Secondary | ICD-10-CM

## 2017-01-23 DIAGNOSIS — J329 Chronic sinusitis, unspecified: Principal | ICD-10-CM

## 2017-02-02 ENCOUNTER — Ambulatory Visit (INDEPENDENT_AMBULATORY_CARE_PROVIDER_SITE_OTHER): Payer: Medicare Other | Admitting: Internal Medicine

## 2017-02-02 ENCOUNTER — Ambulatory Visit: Payer: Medicare Other

## 2017-02-02 VITALS — BP 140/91 | HR 59 | Temp 98.1°F | Wt 156.6 lb

## 2017-02-02 DIAGNOSIS — Z0000 Encounter for general adult medical examination without abnormal findings: Secondary | ICD-10-CM

## 2017-02-02 DIAGNOSIS — E785 Hyperlipidemia, unspecified: Secondary | ICD-10-CM

## 2017-02-02 DIAGNOSIS — K219 Gastro-esophageal reflux disease without esophagitis: Secondary | ICD-10-CM

## 2017-02-02 DIAGNOSIS — L309 Dermatitis, unspecified: Secondary | ICD-10-CM | POA: Diagnosis present

## 2017-02-02 DIAGNOSIS — Z23 Encounter for immunization: Secondary | ICD-10-CM

## 2017-02-02 DIAGNOSIS — I10 Essential (primary) hypertension: Secondary | ICD-10-CM | POA: Diagnosis not present

## 2017-02-02 MED ORDER — HYDROXYZINE HCL 10 MG PO TABS: 10.0000 mg | ORAL_TABLET | Freq: Two times a day (BID) | ORAL | 0 refills | Status: DC | PRN

## 2017-02-02 MED ORDER — TRIAMCINOLONE ACETONIDE 0.1 % EX OINT: 1.0000 "application " | TOPICAL_OINTMENT | Freq: Two times a day (BID) | CUTANEOUS | 0 refills | Status: DC

## 2017-02-02 MED ORDER — PANTOPRAZOLE SODIUM 20 MG PO TBEC: 20.0000 mg | DELAYED_RELEASE_TABLET | Freq: Every day | ORAL | 1 refills | Status: DC

## 2017-02-02 NOTE — Assessment & Plan Note (Signed)
Patient self-discontinued Nexium as she thought this was related to her right foot dermatitis. She does have a return of her reflux/heartburn symptoms. - Start Pantoprazole 20 mg daily

## 2017-02-02 NOTE — Progress Notes (Signed)
Internal Medicine Clinic Attending  I saw and evaluated the patient.  I personally confirmed the key portions of the history and exam documented by Dr. Patel and I reviewed pertinent patient test results.  The assessment, diagnosis, and plan were formulated together and I agree with the documentation in the resident's note.  

## 2017-02-02 NOTE — Assessment & Plan Note (Signed)
BP Readings from Last 3 Encounters:  02/02/17 (!) 140/91  11/03/16 126/73  08/08/16 139/67   BP improved on recheck to 140/91, still mildly elevated. She has not taken her BP medication this morning. Will continue current management and advise she take her medication daily. - Continue Amlodipine 10 mg daily

## 2017-02-02 NOTE — Assessment & Plan Note (Signed)
Patient with persistent dermatitis of her right medial foot with darkened dry pruritic rash. Does not appear consistent with tinea fungal infection as no involvement of toes or nailbeds or typical demarcations. She likely has a reactive dermatitis from constant scratching, but unclear why she is having constant pruritus at the area. She has had no improvement with antifungal Clotrimazole cream or low potency Triamcinolone steroid cream. - Start medium potency Triamcinolone 0.1% cream BID for 4 weeks - Atarax 10 mg twice daily only as needed for pruritus - Vaseline over rash - Avoid scratching area - If no improvement in 4 weeks we will refer to Dermatology

## 2017-02-02 NOTE — Progress Notes (Signed)
CC: Rash right foot  HPI:  [name] is a 61 y.o. female with PMH as listed below who presents for evaluation of rash on her right foot.  Right Foot Dermatitis: Patient reports a dark pruritic rash on the medial side of her right foot that has been present for 1 year. She was treated with an antifungal cream without relief. She has recently tried low-potency Triamcinolone without improvement. She has not noticed any similar rash at any other site in her body. She is scratching the area frequently due to pruritus. She thinks her reflux medicine, Nexium, is related to her rash as she has read that one of the side effects from chronic use is rash. She has not noticed any particular material that exacerbates her rash.  GERD: She has been taking Nexium with good control of her reflux. She self-discontinued it 1 month ago as she felt this medicine was related to her right foot dermatitis. She does have a return of reflux symptoms since stopping her PPI.  HTN: She is on Amlodipine 10 mg daily, which she has not taken yet this morning. BP on arrival was 180/93 and 140/91 on repeat.  Healthcare Maintenance: Patient requesting flu shot today.  Past Medical History:  Diagnosis Date  . Degenerative disc disease, cervical    L spine 10/09 showing DJD and cervical spurring but no specific foraminal narrowing. Has been followed by Rochele Pages, pain management physician in [place].  . Depression    f/b Dr. Cardell Peach at [facility]  . GERD (gastroesophageal reflux disease)   . Herniated disc    L5-S1. s/p laminectomy in 1993 by Dr. Shelle Iron.  MRI June 2005 showing L4/5 disc bulge + mild facet arthropathy. F/b Dr. Marzetta Board for pain management. Wheel chair bound.  . Hyperlipidemia   . Hypertension   . Pyloric stenosis    s/p EGD with balloon dilatation on 06/23/2007 by Dr. Elnoria Howard  . Seborrheic dermatitis    of face, sees Dr. Merlyn Albert Luptom, dermatop cream TID  . Tobacco abuse    Did not  tolerate Chantix (vomiting)  . Wears hearing aid    In r. ear, followed by ENT. 2/2 recurrent otitis   Review of Systems:   Review of Systems  Respiratory: Negative for shortness of breath.   Cardiovascular: Negative for chest pain.  Gastrointestinal: Positive for heartburn.  Skin: Positive for itching and rash.       Pruritic rash right foot     Physical Exam:  Vitals:   02/02/17 0938 02/02/17 1018  BP: (!) 180/93 (!) 140/91  Pulse: 72 (!) 59  Temp: 98.1 F (36.7 C)   TempSrc: Oral   SpO2: 100%   Weight: 156 lb 9.6 oz (71 kg)    Physical Exam  Constitutional: She is oriented to person, place, and time. She appears well-developed and well-nourished. No distress.  Resting in wheelchair  HENT:  Head: Normocephalic and atraumatic.  Cardiovascular: Normal rate.   Pulses:      Dorsalis pedis pulses are 2+ on the right side.  Pulmonary/Chest: No respiratory distress.  Neurological: She is alert and oriented to person, place, and time.  Skin: Skin is warm and dry. She is not diaphoretic.  Darkened thick dry skin right medial foot without open sores, bleeding, increased warmth to touch, or erythema. Scratch marks present. No involvement of toes or toenails.    Assessment & Plan:   See Encounters Tab for problem based charting.  Patient seen with  Dr. Oswaldo Done  Dermatitis of right foot Patient with persistent dermatitis of her right medial foot with darkened dry pruritic rash. Does not appear consistent with tinea fungal infection as no involvement of toes or nailbeds or typical demarcations. She likely has a reactive dermatitis from constant scratching, but unclear why she is having constant pruritus at the area. She has had no improvement with antifungal Clotrimazole cream or low potency Triamcinolone steroid cream. - Start medium potency Triamcinolone 0.1% cream BID for 4 weeks - Atarax 10 mg twice daily only as needed for pruritus - Vaseline over rash - Avoid scratching  area - If no improvement in 4 weeks we will refer to Dermatology  GERD Patient self-discontinued Nexium as she thought this was related to her right foot dermatitis. She does have a return of her reflux/heartburn symptoms. - Start Pantoprazole 20 mg daily  Essential hypertension BP Readings from Last 3 Encounters:  02/02/17 (!) 140/91  11/03/16 126/73  08/08/16 139/67   BP improved on recheck to 140/91, still mildly elevated. She has not taken her BP medication this morning. Will continue current management and advise she take her medication daily. - Continue Amlodipine 10 mg daily  Healthcare maintenance Flu shot given today.

## 2017-02-02 NOTE — Assessment & Plan Note (Signed)
Flu shot given today

## 2017-02-02 NOTE — Patient Instructions (Addendum)
It was a pleasure to meet you Miranda Gaines.  We will try a stronger steroid Triamcinolone ointment for your foot rash to use twice a day up to 4 weeks.  You can use Atarax (hydroxyzine) only as needed twice a day for itching.  Please try your best to avoid scratching your right foot as this will only make your rash worse.  Use Vaseline over your rash. You can apply Vaseline before bedtime and wear a sock over your foot to help with dry skin.  If your rash is not getting better in four 4 weeks, we will consider referring you to the skin doctor (dermatologist).  We will try a different medicine for your reflux called Protonix (pantoprazole) once a day.  We have given you the flu shot today.  Please take your other medications as prescribed.

## 2017-02-22 ENCOUNTER — Other Ambulatory Visit: Payer: Self-pay | Admitting: Internal Medicine

## 2017-02-22 DIAGNOSIS — K219 Gastro-esophageal reflux disease without esophagitis: Secondary | ICD-10-CM

## 2017-02-22 DIAGNOSIS — L309 Dermatitis, unspecified: Secondary | ICD-10-CM

## 2017-02-26 NOTE — Telephone Encounter (Addendum)
Lm to call back. Per Dr. Cammy BrochureHarden's note  "If she needs a refill on the triamcinolone cream, can we make an appointment to re-evaluate her foot? Dr. Allena KatzPatel had mentioned that we should expect improvement in about 4 weeks (3 wks ago) and if not, may warrant a Dermatology referral. "

## 2017-02-26 NOTE — Telephone Encounter (Signed)
Per patient- her foot has not improved. [facility]

## 2017-02-27 ENCOUNTER — Ambulatory Visit: Payer: Medicare Other

## 2017-03-02 ENCOUNTER — Ambulatory Visit (INDEPENDENT_AMBULATORY_CARE_PROVIDER_SITE_OTHER): Payer: Medicare Other | Admitting: Internal Medicine

## 2017-03-02 ENCOUNTER — Other Ambulatory Visit: Payer: Self-pay

## 2017-03-02 ENCOUNTER — Other Ambulatory Visit (HOSPITAL_COMMUNITY)
Admission: RE | Admit: 2017-03-02 | Discharge: 2017-03-02 | Disposition: A | Discharge status: Home / Self Care | Payer: Medicare Other | Source: Ambulatory Visit | Attending: Student in an Organized Health Care Education/Training Program | Admitting: Student in an Organized Health Care Education/Training Program

## 2017-03-02 DIAGNOSIS — L308 Other specified dermatitis: Secondary | ICD-10-CM | POA: Insufficient documentation

## 2017-03-02 DIAGNOSIS — L309 Dermatitis, unspecified: Secondary | ICD-10-CM

## 2017-03-02 NOTE — Assessment & Plan Note (Addendum)
Assessment: The patient presents with continued chronic rash of the right foot and medial aspect distal to the medial malleolus.  See imaging note. See HPI for additional details. Patient lacks systemic symptoms or rash in other areas of the body Rash is inconsistent with infectious etiology or contact dermatitis. No clearly identifiable. Unresponsive to antifungal or topical corticosteroids.   Plan: 4mm Punch biopsy. Specimen sent to lab with order placed for path report. Referral to dermatology ordered Will call with results.   Addendum: Called patient with results of her biopsy. Called in additional refills for her steroid cream. She is still to be seen by dermatology, but will increase use of cream at this point.

## 2017-03-02 NOTE — Patient Instructions (Signed)
Skin Biopsy A skin biopsy is a procedure to remove a sample of your skin (lesion) so it can be checked under a microscope. You may need a skin biopsy if you have a skin disease or abnormal changes in your skin. Tell a health care provider about:  Any allergies you have.  All medicines you are taking, including vitamins, herbs, eye drops, creams, and over-the-counter medicines.  Any problems you or family members have had with anesthetic medicines.  Any blood disorders you have.  Any surgeries you have had.  Any medical conditions you have. What are the risks? Generally, this is a safe procedure. However, problems may occur, including:  Infection.  Bleeding.  Allergic reaction to medicines.  Scarring.  What happens before the procedure?  Ask your health care provider about changing or stopping your regular medicines. This is especially important if you are taking diabetes medicines or blood thinners.  Follow instructions from your health care provider about how to care for your skin.  Ask your health care provider how your biopsy site will be marked or identified.  You may be given antibiotic medicine to prevent infection. What happens during the procedure?  To reduce your risk of infection: ? Your health care team will wash or sanitize their hands. ? Your skin will be washed with soap.  You may be given a medicine to help you relax (sedative).  You will be givena medicine to numb the area (local anesthetic).  The method that your health care provider will use for your skin biopsy will depend on the type of skin problem you have. Options include: ? Shave biopsy. Your health care provider will shave away layers of your skin lesion with a sharp blade. After shaving, a gel or ointment may be used to control bleeding. ? Punch biopsy. Your health care provider will use a tool to remove all or part of the lesion. This leaves a small hole about the width of a pencil eraser.  The area may be covered with a gel or ointment. ? Excisional or incisional biopsy. Your health care provider will use a surgical blade to remove all or part of your lesion.  Your skin biopsy site may be closed with stitches (sutures).  A bandage (dressing) will be applied. The procedure may vary among health care providers and hospitals. What happens after the procedure?  Your skin sample will be sent to a laboratory for examination.  Your skin biopsy site will be watched to make sure that it stops bleeding.  Do not drive for 24 hours if you received a sedative. This information is not intended to replace advice given to you by your health care provider. Make sure you discuss any questions you have with your health care provider. Document Released: 05/12/2004 Document Revised: 12/05/2015 Document Reviewed: 07/08/2014 Elsevier Interactive Patient Education  [employer]2018 Elsevier Inc.  Please follow the directions listed above with regard to your wound care.   [facility] you [facility] your [facility] to the [facility].

## 2017-03-02 NOTE — Progress Notes (Signed)
   CC: Rash on the right foot  HPI:  [name] is a 61 y.o. who presents today for evaluation of a rash on her right foot. She was previously seen on 02/02/2017 for the rash. At that time it was thought to potentially be allergic or contact related and treated with Triamcinolone 0.1% cream as she had previously failed treatment with Clotrimazole and 0.25% triamcinolone cream. She would like a referral to dermatology as well.  She denied fever, chills, nausea, vomiting, diarrhea, constipation, chest pain, palpitations, abdominal pain, headache, visual changes, rash presenting or any other portion of her body, or having experienced similar symptoms in the past.  Past Medical History:  Diagnosis Date  . Degenerative disc disease, cervical    L spine 10/09 showing DJD and cervical spurring but no specific foraminal narrowing. Has been followed by Rochele PagesJanie Kelly, pain management physician in [place].  . Depression    [facility]  . GERD (gastroesophageal reflux disease)   . Herniated disc    L5-S1. s/p laminectomy in 1993 by Dr. Shelle IronBeane.  MRI June 2005 showing L4/5 disc bulge + mild facet arthropathy. F/b Dr. Marzetta BoardJane Keilitz for pain management. Wheel chair bound.  . Hyperlipidemia   . Hypertension   . Pyloric stenosis    s/p EGD with balloon dilatation on 06/23/2007 by Dr. Elnoria HowardHung  . Seborrheic dermatitis    of face, sees Dr. Merlyn AlbertFred Luptom, dermatop cream TID  . Tobacco abuse    Did not tolerate Chantix (vomiting)  . Wears hearing aid    In r. ear, followed by ENT. 2/2 recurrent otitis   Review of Systems:  ROS negative except as per HPI.  Physical Exam:  Vitals:   03/02/17 0920  BP: 128/80  Pulse: 75  Temp: 98 F (36.7 C)  TempSrc: Oral  SpO2: 100%  Weight: 169 lb 9.6 oz (76.9 kg)  Height: 4\' 6"  (1.372 m)       Physical Exam  Constitutional: She appears well-developed and well-nourished. No distress.  Cardiovascular: Normal rate and regular rhythm.    Pulmonary/Chest: Effort normal and breath sounds normal.  Abdominal: Soft. Bowel sounds are normal.  Musculoskeletal: She exhibits tenderness (Overlying the area of the rash). She exhibits no edema.  Skin: Rash (See image above) noted.    Assessment & Plan:   See Encounters Tab for problem based charting.  Patient seen with Dr. Oswaldo DoneVincent

## 2017-03-05 NOTE — Progress Notes (Signed)
Internal Medicine Clinic Attending  I saw and evaluated the patient.  I personally confirmed the key portions of the history and exam documented by Dr. Harbrecht and I reviewed pertinent patient test results.  The assessment, diagnosis, and plan were formulated together and I agree with the documentation in the resident's note. I was present for the entirety of the procedure.  

## 2017-03-13 ENCOUNTER — Telehealth: Payer: Self-pay

## 2017-03-13 NOTE — Telephone Encounter (Signed)
Requesting test results. Please call pt back.  

## 2017-03-19 ENCOUNTER — Telehealth: Payer: Self-pay | Admitting: *Deleted

## 2017-03-19 MED ORDER — TRIAMCINOLONE ACETONIDE 0.5 % EX OINT: 1.0000 "application " | TOPICAL_OINTMENT | Freq: Three times a day (TID) | CUTANEOUS | 3 refills | Status: DC

## 2017-03-19 NOTE — Telephone Encounter (Signed)
Pt calling back requesting skin punch bx results-states area is "itching really bad".  Please call pt back with results, thank you.[name], Miranda Cassady11/26/20183:17 PM

## 2017-03-19 NOTE — Telephone Encounter (Signed)
Per Dr. Crista ElliotHarbrecht- he  will call patient back. Patient informed.

## 2017-03-19 NOTE — Addendum Note (Signed)
Addended by: Evelena LeydenHARBRECHT, Reis Goga E on: 03/19/2017 06:32 PM   Modules accepted: Orders

## 2017-03-20 ENCOUNTER — Telehealth: Payer: Self-pay | Admitting: *Deleted

## 2017-03-21 ENCOUNTER — Other Ambulatory Visit: Payer: Self-pay | Admitting: Internal Medicine

## 2017-03-21 DIAGNOSIS — K219 Gastro-esophageal reflux disease without esophagitis: Secondary | ICD-10-CM

## 2017-03-21 NOTE — Telephone Encounter (Signed)
review 

## 2017-03-21 NOTE — Telephone Encounter (Signed)
Dr Crista Elliotharbrecht has advised pt

## 2017-03-21 NOTE — Progress Notes (Signed)
Called Patient. Discussed results and to continue to follow with dermatology.

## 2017-04-04 DIAGNOSIS — F332 Major depressive disorder, recurrent severe without psychotic features: Secondary | ICD-10-CM | POA: Diagnosis not present

## 2017-04-10 ENCOUNTER — Other Ambulatory Visit: Payer: Self-pay | Admitting: Internal Medicine

## 2017-05-16 ENCOUNTER — Other Ambulatory Visit: Payer: Self-pay | Admitting: Internal Medicine

## 2017-05-31 ENCOUNTER — Telehealth: Payer: Self-pay | Admitting: *Deleted

## 2017-06-01 ENCOUNTER — Ambulatory Visit (HOSPITAL_COMMUNITY)
Admission: EM | Admit: 2017-06-01 | Discharge: 2017-06-01 | Disposition: A | Discharge status: Home / Self Care | Payer: Medicare Other | Attending: Internal Medicine | Admitting: Internal Medicine

## 2017-06-01 ENCOUNTER — Other Ambulatory Visit: Payer: Self-pay

## 2017-06-01 ENCOUNTER — Encounter (HOSPITAL_COMMUNITY): Payer: Self-pay | Admitting: Emergency Medicine

## 2017-06-01 DIAGNOSIS — R21 Rash and other nonspecific skin eruption: Secondary | ICD-10-CM | POA: Diagnosis not present

## 2017-06-01 DIAGNOSIS — L309 Dermatitis, unspecified: Secondary | ICD-10-CM | POA: Diagnosis not present

## 2017-06-01 MED ORDER — TRIAMCINOLONE ACETONIDE 40 MG/ML IJ SUSP
INTRAMUSCULAR | Status: AC
  Filled 2017-06-01: qty 1

## 2017-06-01 MED ORDER — TRIAMCINOLONE ACETONIDE 40 MG/ML IJ SUSP
40.0000 mg | Freq: Once | INTRAMUSCULAR | Status: AC
  Administered 2017-06-01: 40 mg via INTRAMUSCULAR

## 2017-06-01 NOTE — Discharge Instructions (Signed)
Your biopsy did appear to find Eczema, also known as dermatitis. You may continue with the cream to your foot as previously prescribed. We will try a steroid shot today to see if this is more helpful with your facial rash. Please keep your appointment next week to follow up with your doctor for a recheck. Keep showers short as able and as cool of temperature as able.

## 2017-06-01 NOTE — ED Triage Notes (Signed)
[facility] a [facility] of a chronic recurring rash and swelling to her face.

## 2017-06-01 NOTE — ED Provider Notes (Signed)
[facility]    CSN: [id number] Arrival date & time: 06/01/17  1014     History   Chief Complaint Chief Complaint  Patient presents with  . Rash    HPI Miranda Gaines is a 62 y.o. female.   Miranda ForestShirley presents with complaints of facial redness, burning and itching. She has had facial rash for years, states she uses hydrocortisone topically to control. She noticed that her skin was tightening therefore she stopped using it last month. Rash to her face has been present for the past week. She also has a rash to her right foot which she applies Kenalog, states it has mildly improved. Biopsy completed from foot rash in November, with findings of contact or nummular dermatitis and some areas of lichen. She states she saw dermatology years ago, had allergy testing completed. History of hyperlipedema, gerd, DDD, htn. Without history of diabetes.    ROS per HPI.       Past Medical History:  Diagnosis Date  . Degenerative disc disease, cervical    L spine 10/09 showing DJD and cervical spurring but no specific foraminal narrowing. Has been followed by Miranda Gaines, pain management physician in [place].  . Depression    [facility]  . GERD (gastroesophageal reflux disease)   . Herniated disc    L5-S1. s/p laminectomy in 1993 by Miranda Gaines.  MRI June 2005 showing L4/5 disc bulge + mild facet arthropathy. F/b Miranda Gaines for pain management. Wheel chair bound.  . Hyperlipidemia   . Hypertension   . Pyloric stenosis    s/p EGD with balloon dilatation on 06/23/2007 by Miranda Gaines  . Seborrheic dermatitis    of face, sees Miranda Gaines, dermatop cream TID  . Tobacco abuse    Did not tolerate Chantix (vomiting)  . Wears hearing aid    In r. ear, followed by ENT. 2/2 recurrent otitis    Patient Active Problem List   Diagnosis Date Noted  . Impaired physical mobility 11/03/2016  . Dermatitis of right foot 08/09/2016  . Obesity (BMI 30.0-34.9)  07/01/2015  . Constipation 06/24/2015  . Bilateral chronic knee pain 08/10/2014  . Healthcare maintenance 05/28/2012  . Chronic sinusitis 04/26/2011  . Hyperlipidemia 05/04/2006  . Depression 05/04/2006  . Essential hypertension 05/04/2006  . GERD 05/04/2006  . Seborrheic dermatitis 05/04/2006  . LOW BACK PAIN 05/04/2006    Past Surgical History:  Procedure Laterality Date  . EGD w. balllon dilation for pyloric stenosis  2009   Miranda Gaines  . LUMBAR LAMINECTOMY  1993   Miranda Gaines    OB History    No data available       Home Medications    Prior to Admission medications   Medication Sig Start Date End Date Taking? Authorizing Provider  amLODipine (NORVASC) 10 MG tablet TAKE 1 TABLET BY MOUTH ONCE DAILY 05/17/17   Ginger CarneHarden, Kyler, MD  atorvastatin (LIPITOR) 80 MG tablet TAKE 1 TABLET BY MOUTH ONCE DAILY AT 6PM. 08/08/16   Rivet, Iris Pertarly J, MD  cyclobenzaprine (FLEXERIL) 10 MG tablet Take 10 mg by mouth 3 (three) times daily as needed. Muscle spasms 03/24/15   [provider]  fluticasone (FLONASE) 50 MCG/ACT nasal spray INSTILL 1 SPRAY IN EACH NOSTRIL DAILY 01/24/17   Ginger CarneHarden, Kyler, MD  hydrOXYzine (ATARAX/VISTARIL) 10 MG tablet TAKE 1 TABLET BY MOUTH TWICE DAILY AS NEEDED FOR ITCHING 02/23/17   Ginger CarneHarden, Kyler, MD  loratadine (CLARITIN) 10 MG  tablet Take 1 tablet (10 mg total) by mouth daily. 12/04/16   Ginger Carne, MD  nortriptyline (PAMELOR) 75 MG capsule Take 75 mg by mouth at bedtime.    [provider]  pantoprazole (PROTONIX) 20 MG tablet TAKE 1 TABLET BY MOUTH DAILY 03/22/17   Ginger Carne, MD  traZODone (DESYREL) 50 MG tablet Take 50 mg by mouth at bedtime. Prescribed by Mental Health.     [provider]  triamcinolone ointment (KENALOG) 0.1 % APPLY 1 APPLICATION TOPICALLY TWICE DAILY. USE FOR 4 WEEKS 02/23/17   Ginger Carne, MD  triamcinolone ointment (KENALOG) 0.5 % Apply 1 application topically 3 (three) times daily. 03/19/17   Lanelle Bal, MD    Family History Family History  Problem Relation Age of Onset  . Coronary artery disease Mother        deceased at 33  . Cancer Father        deceased at 20, lung cancer    Social History Social History   Tobacco Use  . Smoking status: Former Smoker    Last attempt to quit: 02/10/2014    Years since quitting: 3.3  . Smokeless tobacco: Former Neurosurgeon  . Tobacco comment: quit at 2012  Substance Use Topics  . Alcohol use: No    Alcohol/week: 0.0 oz  . Drug use: No     Allergies   Codeine; Ibuprofen; Lyrica [pregabalin]; Maxzide [hydrochlorothiazide w-triamterene]; Morphine; Oxycodone hcl; and Penicillins   Review of Systems Review of Systems   Physical Exam Triage Vital Signs ED Triage Vitals [06/01/17 1037]  Enc Vitals Group     BP (!) 160/91     Pulse Rate 90     Resp 20     Temp 98 F (36.7 C)     Temp Source Oral     SpO2 97 %     Weight      Height      Head Circumference      Peak Flow      Pain Score 10     Pain Loc      Pain Edu?      Excl. in GC?    No data found.  Updated Vital Signs BP (!) 160/91 (BP Location: Left Arm)   Pulse 90   Temp 98 F (36.7 C) (Oral)   Resp 20   SpO2 97%   Visual Acuity Right Eye Distance:   Left Eye Distance:   Bilateral Distance:    Right Eye Near:   Left Eye Near:    Bilateral Near:     Physical Exam  Constitutional: She is oriented to person, place, and time. She appears well-developed and well-nourished. No distress.  Cardiovascular: Normal rate, regular rhythm and normal heart sounds.  Pulmonary/Chest: Effort normal and breath sounds normal.  Neurological: She is alert and oriented to person, place, and time.  Skin: Skin is warm and dry. Rash noted.  Face with mildly raised red rash noted to cheeks and forehead; see photo       UC Treatments / Results  Labs (all labs ordered are listed, but only abnormal results are displayed) Labs Reviewed - No data to display  EKG  EKG  Interpretation None       Radiology No results found.  Procedures Procedures (including critical care time)  Medications Ordered in UC Medications  triamcinolone acetonide (KENALOG-40) injection 40 mg (not administered)     Initial Impression / Assessment and Plan / UC Course  I have  reviewed the triage vital signs and the nursing notes.  Pertinent labs & imaging results that were available during my care of the patient were reviewed by me and considered in my medical decision making (see chart for details).     Opted to try triamcinolone IM x1 as she has not been tolerating hydrocortisone use to her face and foot rash still present. Encouraged follow up for recheck next week as already scheduled. Patient verbalized understanding and agreeable to plan.    Final Clinical Impressions(s) / UC Diagnoses   Final diagnoses:  Dermatitis    ED Discharge Orders    None       Controlled Substance Prescriptions [place] Controlled Substance Registry consulted? Not Applicable   Georgetta Haber, NP 06/01/17 1116

## 2017-06-05 ENCOUNTER — Encounter: Payer: Self-pay | Admitting: Internal Medicine

## 2017-06-05 ENCOUNTER — Ambulatory Visit (INDEPENDENT_AMBULATORY_CARE_PROVIDER_SITE_OTHER): Payer: Medicare Other | Admitting: Internal Medicine

## 2017-06-05 VITALS — BP 128/79 | HR 73 | Temp 98.0°F | Ht <= 58 in | Wt 162.2 lb

## 2017-06-05 DIAGNOSIS — L309 Dermatitis, unspecified: Secondary | ICD-10-CM | POA: Insufficient documentation

## 2017-06-05 DIAGNOSIS — Z87891 Personal history of nicotine dependence: Secondary | ICD-10-CM

## 2017-06-05 NOTE — Progress Notes (Signed)
Internal Medicine Clinic Attending  Case discussed with Dr. Blum at the time of the visit.  We reviewed the resident's history and exam and pertinent patient test results.  I agree with the assessment, diagnosis, and plan of care documented in the resident's note. 

## 2017-06-05 NOTE — Patient Instructions (Addendum)
It was a pleasure to meet you today Miranda Gaines,    Please stop using the triamcinolone or hydrocortisone creams  Start using a lotion on your face four times daily  I have referred you to a dermatologist, please call our clinic for an update if you have not heard back about scheduling this within the next week   - Please call our clinic if you have any problems or questions, we may be able to help you and keep you from a long emergency room wait. Our clinic and after hours phone number is [phone]   FOLLOW-UP INSTRUCTIONS When: 3 months with Miranda Gaines  ( please schedule an appointment for may 14th with the front desk )  For: general check up  What to bring: all of your medication bottles

## 2017-06-05 NOTE — Assessment & Plan Note (Signed)
Facial rash with urticaria over the forehead, upper eye lids, cheeks, and chin for years. No other skin involved. Has used high potency triamcinolone 0.5% cream and hydrocortisone but now finds that her skin is tight in the morning when she wakes up. Wants a recommendation for what else to try. Went to urgent care 2/8 and received a cortisone injection, this worked to improve the rash as evident by photos taken in the media tab prior to the treatment. Hasn't tried OTC emollients yet but has a bottle of Aveeno lotion with her today and ask if she should try this. Would prefer a prescription, I considered Tacrolimus which is next in line after steroids and emollients but discussed side effects of risk of infection and cancers and that emollients would be a safer route and have a higher recommendation. Would like to see dermatology.     - referral to dermatology  - recommended trial of over the counter emollient   - discontinue triamcinolone

## 2017-06-05 NOTE — Progress Notes (Signed)
   CC: face rash   HPI:  Miranda Gaines is a 62 y.o. with PMH as listed below who presents for acute concern of a rash on her face. Please see the assessment and plans for the status of the patient chronic medical problems.    Past Medical History:  Diagnosis Date  . Degenerative disc disease, cervical    L spine 10/09 showing DJD and cervical spurring but no specific foraminal narrowing. Has been followed by Rochele PagesJanie Kelly, pain management physician in [place].  . Depression    f/[facility]. [facility]  . GERD (gastroesophageal reflux disease)   . Herniated disc    L5-S1. s/p laminectomy in 1993 by Dr. Shelle IronBeane.  MRI June 2005 showing L4/5 disc bulge + mild facet arthropathy. F/b Dr. Marzetta BoardJane Keilitz for pain management. Wheel chair bound.  . Hyperlipidemia   . Hypertension   . Pyloric stenosis    s/p EGD with balloon dilatation on 06/23/2007 by Dr. Elnoria HowardHung  . Seborrheic dermatitis    of face, sees Dr. Merlyn AlbertFred Luptom, dermatop cream TID  . Tobacco abuse    Did not tolerate Chantix (vomiting)  . Wears hearing aid    In r. ear, followed by ENT. 2/2 recurrent otitis   Review of Systems:  Refer to history of present illness and assessment and plans for pertinent review of systems, all others reviewed and negative  Physical Exam:  Vitals:   06/05/17 0851  BP: 128/79  Pulse: 73  Temp: 98 F (36.7 C)  TempSrc: Oral  SpO2: 98%  Weight: 162 lb 3.2 oz (73.6 kg)  Height: 4\' 6"  (1.372 m)   General: well appearing, no acute distress  Skin: darkening and wrinkled skin around the eyes, no rash or erythema visible on my exam today, no visible rash on the exposed neck or hands    Assessment & Plan:   Eczema  Facial rash with urticaria over the forehead, upper eye lids, cheeks, and chin for years. No other skin involved. Has used high potency triamcinolone 0.5% cream and hydrocortisone but now finds that her skin is tight in the morning when she wakes up. Wants a recommendation for  what else to try. Went to urgent care 2/8 and received a cortisone injection, this worked to improve the rash as evident by photos taken in the media tab prior to the treatment. Hasn't tried OTC emollients yet but has a bottle of Aveeno lotion with her today and ask if she should try this. Would prefer a prescription, I considered Tacrolimus which is next in line after steroids and emollients but discussed side effects of risk of infection and cancers and that emollients would be a safer route and have a higher recommendation. Would like to see dermatology.     - referral to dermatology  - recommended trial of over the counter emollient   - discontinue triamcinolone   See Encounters Tab for problem based charting.  Patient discussed with Dr. Rogelia BogaButcher

## 2017-07-11 DIAGNOSIS — F332 Major depressive disorder, recurrent severe without psychotic features: Secondary | ICD-10-CM | POA: Diagnosis not present

## 2017-07-12 ENCOUNTER — Other Ambulatory Visit: Payer: Self-pay | Admitting: Internal Medicine

## 2017-07-12 DIAGNOSIS — L309 Dermatitis, unspecified: Secondary | ICD-10-CM

## 2017-08-07 ENCOUNTER — Other Ambulatory Visit: Payer: Self-pay | Admitting: Internal Medicine

## 2017-08-07 DIAGNOSIS — L309 Dermatitis, unspecified: Secondary | ICD-10-CM

## 2017-08-17 ENCOUNTER — Encounter: Payer: Self-pay | Admitting: *Deleted

## 2017-08-28 ENCOUNTER — Other Ambulatory Visit: Payer: Self-pay | Admitting: Internal Medicine

## 2017-09-04 ENCOUNTER — Encounter: Payer: Medicare Other | Admitting: Internal Medicine

## 2017-09-11 DIAGNOSIS — L2084 Intrinsic (allergic) eczema: Secondary | ICD-10-CM | POA: Diagnosis not present

## 2017-09-18 ENCOUNTER — Encounter: Payer: Medicare Other | Admitting: Internal Medicine

## 2017-09-26 DIAGNOSIS — M961 Postlaminectomy syndrome, not elsewhere classified: Secondary | ICD-10-CM | POA: Diagnosis not present

## 2017-09-26 DIAGNOSIS — M5416 Radiculopathy, lumbar region: Secondary | ICD-10-CM | POA: Diagnosis not present

## 2017-09-26 DIAGNOSIS — M5137 Other intervertebral disc degeneration, lumbosacral region: Secondary | ICD-10-CM | POA: Diagnosis not present

## 2017-10-02 ENCOUNTER — Other Ambulatory Visit: Payer: Self-pay

## 2017-10-02 ENCOUNTER — Encounter: Payer: Self-pay | Admitting: Internal Medicine

## 2017-10-02 ENCOUNTER — Ambulatory Visit: Payer: Medicare Other | Admitting: Pharmacist

## 2017-10-02 ENCOUNTER — Ambulatory Visit (INDEPENDENT_AMBULATORY_CARE_PROVIDER_SITE_OTHER): Payer: Medicare Other | Admitting: Internal Medicine

## 2017-10-02 VITALS — BP 123/74 | HR 66 | Temp 98.8°F | Ht 64.5 in | Wt 156.8 lb

## 2017-10-02 DIAGNOSIS — L309 Dermatitis, unspecified: Secondary | ICD-10-CM

## 2017-10-02 DIAGNOSIS — Z0000 Encounter for general adult medical examination without abnormal findings: Secondary | ICD-10-CM

## 2017-10-02 DIAGNOSIS — Z79899 Other long term (current) drug therapy: Secondary | ICD-10-CM

## 2017-10-02 DIAGNOSIS — I10 Essential (primary) hypertension: Secondary | ICD-10-CM

## 2017-10-02 DIAGNOSIS — E785 Hyperlipidemia, unspecified: Secondary | ICD-10-CM

## 2017-10-02 DIAGNOSIS — Z7409 Other reduced mobility: Secondary | ICD-10-CM

## 2017-10-02 DIAGNOSIS — Z1239 Encounter for other screening for malignant neoplasm of breast: Secondary | ICD-10-CM

## 2017-10-02 MED ORDER — TACROLIMUS 0.1 % EX OINT: TOPICAL_OINTMENT | Freq: Two times a day (BID) | CUTANEOUS | 3 refills | Status: DC

## 2017-10-02 MED ORDER — PIMECROLIMUS 1 % EX CREA: TOPICAL_CREAM | Freq: Two times a day (BID) | CUTANEOUS | 3 refills | Status: DC

## 2017-10-02 MED ORDER — CRISABOROLE 2 % EX OINT: 1.0000 | TOPICAL_OINTMENT | Freq: Two times a day (BID) | CUTANEOUS | 3 refills | Status: DC

## 2017-10-02 NOTE — Patient Instructions (Addendum)
Great to see you Miranda Gaines  We are going to try to prescribe the medication that the dermatologist gave you a sample of.  This may actually be covered by one of your insurance plans. You can try tubs of cream called Aquaphor or Eucerin which are typically recommended. They do have small amounts of alcohol but work well with eczema. Otherwise, continue to avoid the triggers that make you break out.   Keep up the great work with your blood pressure. We are putting in an order for a mammogram also

## 2017-10-02 NOTE — Progress Notes (Signed)
   CC: Follow up eczema  HPI:  Miranda Gaines is a 62 y.o. F with a past emdical history of DDD and impaired mobility, HTN, HLD, and skin issues who presented for follow up of eczema.  Eczema: [facility] of dry skin of her face which was felt to be consistent with eczema. This has been an ongoing issue that she has treated with prn steroid creams, however she feels that steroid creams have caused skin discomfort and tightness. She has seen dermatology in the interval who prescribed Eucrisa, though she was only able to receive a sample due to insurance reasons. She reports this has helped her symptoms but still feels that she gets flares of dry skin when exposed to trigger of cleaning products. She wishes to continue treatment with an effective topical agent that does not contain steroids. She also notes the rash to her foot that had been previously evaluated has resolved.   DDD: Recently seen by pain management doctor, reports pain is well controlled.   Past Medical History:  Diagnosis Date  . Degenerative disc disease, cervical    L spine 10/09 showing DJD and cervical spurring but no specific foraminal narrowing. Has been followed by Rochele PagesJanie Kelly, pain management physician in [place].  . Depression    [facility]  . GERD (gastroesophageal reflux disease)   . Herniated disc    L5-S1. s/p laminectomy in 1993 by Dr. Shelle IronBeane.  MRI June 2005 showing L4/5 disc bulge + mild facet arthropathy. F/b Dr. Marzetta BoardJane Keilitz for pain management. Wheel chair bound.  . Hyperlipidemia   . Hypertension   . Pyloric stenosis    s/p EGD with balloon dilatation on 06/23/2007 by Dr. Elnoria HowardHung  . Seborrheic dermatitis    of face, sees Dr. Merlyn AlbertFred Luptom, dermatop cream TID  . Tobacco abuse    Did not tolerate Chantix (vomiting)  . Wears hearing aid    In r. ear, followed by ENT. 2/2 recurrent otitis   Review of Systems:  Review of Systems  Constitutional: Negative for  fever.  Respiratory: Negative for cough.   Cardiovascular: Negative for chest pain.  Skin: Positive for itching and rash.     Physical Exam:  Vitals:   10/02/17 0905  BP: 123/74  Pulse: 66  Temp: 98.8 F (37.1 C)  TempSrc: Oral  SpO2: 100%  Weight: 156 lb 12.8 oz (71.1 kg)  Height: 5' 4.5" (1.638 m)   General: Sitting in power chair comfortably, no acute distress HEENT: Moist mucus membranes, normal conjuctiva  CV: RRR, no murmur appreciated  Resp: Clear breath sounds bilaterally, normal work of breathing, no distress  Neuro: Alert and oriented x3  Skin: No facial dryness or redness, no dryness to flexural surfaces of extremities. Appears improved compared to clinical photo in media tab.      Assessment & Plan:   See Encounters Tab for problem based charting.  Patient discussed with Dr. Sandre Kittyaines

## 2017-10-02 NOTE — Progress Notes (Signed)
Patient was seen today in a co-visit between the physician and pharmacist. Patient was unable to access Eucrisa, other similar options include Elidel (not covered by Medicare) and Protopic. The Protopic was successfully covered for $1 copay quoted by [facility] pharmacy.  See documentation under Dr. Cammy BrochureHarden's visit for details.

## 2017-10-03 LAB — CBC
Hematocrit: 39.8 % (ref 34.0–46.6)
Hemoglobin: 13.1 g/dL (ref 11.1–15.9)
MCH: 29.8 pg (ref 26.6–33.0)
MCHC: 32.9 g/dL (ref 31.5–35.7)
MCV: 91 fL (ref 79–97)
Platelets: 222 10*3/uL (ref 150–450)
RBC: 4.39 x10E6/uL (ref 3.77–5.28)
RDW: 14.4 % (ref 12.3–15.4)
WBC: 4.7 10*3/uL (ref 3.4–10.8)

## 2017-10-03 LAB — BMP8+ANION GAP
Anion Gap: 12 mmol/L (ref 10.0–18.0)
BUN/Creatinine Ratio: 13 (ref 12–28)
BUN: 12 mg/dL (ref 8–27)
CO2: 23 mmol/L (ref 20–29)
Calcium: 9.7 mg/dL (ref 8.7–10.3)
Chloride: 106 mmol/L (ref 96–106)
Creatinine, Ser: 0.92 mg/dL (ref 0.57–1.00)
GFR calc Af Amer: 77 mL/min/{1.73_m2} (ref >59)
GFR calc non Af Amer: 67 mL/min/{1.73_m2} (ref >59)
Glucose: 81 mg/dL (ref 65–99)
Potassium: 4.3 mmol/L (ref 3.5–5.2)
Sodium: 141 mmol/L (ref 134–144)

## 2017-10-06 NOTE — Assessment & Plan Note (Signed)
BP today at 123/74, well controlled. Currently on Amlodipine 10 mg daily. Will continue current regimen, obtain updated routine labs.

## 2017-10-06 NOTE — Assessment & Plan Note (Signed)
Pt has been seen by Derm in the interval who provided the pt with a sample of eucrisa, a topical phosphodiesterase-4 inhibitor, which improved her sx but is unable to be covered with the patient's insurance. No current facial rash. She feels cleaning products, strong scents are potential triggers. Review of derm outpatient note (to be scanned to media tab at later date) also notes providing education on safe use of topical steroids for flares, though she states she wishes to avoid this option as much as possible. With the assistance of pharmacy, the pt does have coverage for a topical calcineurin inhibitor for control of her facial sx. We will prescribe at this time, and she was instructed to discuss this option on follow up with dermatology (scheduled for August) and encouraged to keep this appointment. Also continued to encourage use of over the counter emollients   --Topical Tacrolimus BID --F/u with dermatology as scheduled

## 2017-10-06 NOTE — Assessment & Plan Note (Signed)
Due for mammogram which was ordered today. No documentation of tetanus and pt unsure of last administration but wishes to postpone at this point.

## 2017-10-13 NOTE — Progress Notes (Signed)
Internal Medicine Clinic Attending  Case discussed with Dr. Harden  at the time of the visit.  We reviewed the resident's history and exam and pertinent patient test results.  I agree with the assessment, diagnosis, and plan of care documented in the resident's note.  Alexander N Raines, MD   

## 2017-10-17 DIAGNOSIS — F332 Major depressive disorder, recurrent severe without psychotic features: Secondary | ICD-10-CM | POA: Diagnosis not present

## 2017-10-30 ENCOUNTER — Other Ambulatory Visit: Payer: Self-pay | Admitting: Internal Medicine

## 2017-10-30 DIAGNOSIS — B9789 Other viral agents as the cause of diseases classified elsewhere: Secondary | ICD-10-CM

## 2017-10-30 DIAGNOSIS — J329 Chronic sinusitis, unspecified: Principal | ICD-10-CM

## 2017-11-01 ENCOUNTER — Encounter: Payer: Self-pay | Admitting: Dietician

## 2017-11-01 ENCOUNTER — Ambulatory Visit (INDEPENDENT_AMBULATORY_CARE_PROVIDER_SITE_OTHER): Payer: Medicare Other | Admitting: Dietician

## 2017-11-01 DIAGNOSIS — K5900 Constipation, unspecified: Secondary | ICD-10-CM | POA: Diagnosis not present

## 2017-11-01 DIAGNOSIS — Z713 Dietary counseling and surveillance: Secondary | ICD-10-CM

## 2017-11-01 DIAGNOSIS — Z6826 Body mass index (BMI) 26.0-26.9, adult: Secondary | ICD-10-CM | POA: Diagnosis not present

## 2017-11-01 NOTE — Patient Instructions (Addendum)
Ideas to increase your fiber to relieve constipation- you need more fiber- about 6-10 grams more a day. Total 21-25 grams of day from your food.   Instead of sausage, oodles of noodles- eat vegetables or fruit or a whole grain  Instead of juice drink water tea or coffee and eat whole fruit or vegetable with the peel if you can  You need 3 handfuls of vegetables every day  You need 2 handfuls of fruit every day   Old fashioned oats instead of instant oatmeal- can add a teaspoons of chia seeds Eat fruit instead of drinking juice- Very little juice 1/2 cup per day. Use figs, dates, raisins, bananas or sliced very thin apples on your peanut butter sandwich  Lastly, if your do this for 3-5 days and still don't have a BM- you can try a natural laxative like psyllium husk or chia seeds  chia seeds- soak 2 tablespoons of chia seeds in 8 ounces of liquid- can use your juice until it is gone. Then start adding to salads, soups, cereal.   You need activity every day. Consider doing abdominal stretches every day or attending chair yoga class    Constipation, Adult Constipation is when a person:  Poops (has a bowel movement) fewer times in a week than normal.  Has a hard time pooping.  Has poop that is dry, hard, or bigger than normal.  Follow these instructions at home: Eating and drinking   Eat foods that have a lot of fiber, such as: ? Fresh fruits and vegetables. ? Whole grains. ? Beans.  Eat less of foods that are high in fat, low in fiber, or overly processed, such as: ? [place] fries. ? Hamburgers. ? Cookies. ? Candy. ? Soda.  Drink enough fluid to keep your pee (urine) clear or pale yellow. General instructions  Exercise regularly or as told by your doctor.  Go to the restroom when you feel like you need to poop. Do not hold it in.  Take over-the-counter and prescription medicines only as told by your doctor. These include any fiber supplements.  Do pelvic floor  retraining exercises, such as: ? Doing deep breathing while relaxing your lower belly (abdomen). ? Relaxing your pelvic floor while pooping.  Watch your condition for any changes.  Keep all follow-up visits as told by your doctor. This is important. Contact a doctor if:  You have pain that gets worse.  You have a fever.  You have not pooped for 4 days.  You throw up (vomit).  You are not hungry.  You lose weight.  You are bleeding from the anus.  You have thin, pencil-like poop (stool). Get help right away if:  You have a fever, and your symptoms suddenly get worse.  You leak poop or have blood in your poop.  Your belly feels hard or bigger than normal (is bloated).  You have very bad belly pain.  You feel dizzy or you faint.

## 2017-11-01 NOTE — Progress Notes (Signed)
Documentation:  Ms. Miranda Gaines presents for help with her  Problems with constipation.  she reports no bowel movement in 2 weeks. Has lot's of gas and problems with reflux. Her gums have white bumps. She thinks her mental health medicines are causing the constipation. This is a reorrurence in the past year as her constipation had resolved the last time I saw her in 2018  24-hr recall suggests intake of less fiber- estimated about 15-17 grams/day, more fat,  sugar and processed foods. Her dentures do not help her masticate peels such as apple or pear peels. She does not have a blender to blenderize them.   (Up at  AM) 10 am B ( 11 AM)- 2 Toast- 100% whole wheat, 2 boiled eggs, jelly, orange or apple juice (3-6)   Or Water, sausage patties x2, oatmeal-instant x1,  L ( 1 PM)-  Peanut butter & jelly sandwich,  or pack of oodles of noodles, broccoli (5) Snk ( 2-3 PM)- orange or apple (2)  D ( 5 PM)- broccoli, baked chicken or fish, potatoes- from fresh (4)  Snk ( PM)- juice or eats fruit  Beverages- 10 bottles of water a day, juice Typical day? Yes.    She eats ~ 1 fruit and 1 vegetable/day: Grapes, strawberries, grapes, bananas, apples, orange, raisins, prunes, prune juice, Broccoli, corn, string beans, carrots  Estimated body mass index is 26.5 kg/m as calculated from the following:   Height as of 10/02/17: 5' 4.5" (1.638 m).   Weight as of 10/02/17: 156 lb 12.8 oz (71.1 kg).  Wt Readings from Last 3 Encounters:  10/02/17 156 lb 12.8 oz (71.1 kg)  06/05/17 162 lb 3.2 oz (73.6 kg)  03/02/17 169 lb 9.6 oz (76.9 kg)    BP Readings from Last 3 Encounters:  10/02/17 123/74  06/05/17 128/79  06/01/17 (!) 160/91   Plan: Educated patient about ways to increase fiber intake per instructions. Consider a natural laxative  If needed. Follow up in 2 weeks recommended but patient preferred 3-4 weeks.  Norm Parcelonna Plyler, RD 11/01/2017 11:55 AM.

## 2017-11-12 ENCOUNTER — Encounter: Payer: Self-pay | Admitting: *Deleted

## 2017-11-22 ENCOUNTER — Ambulatory Visit: Payer: Medicare Other | Admitting: Dietician

## 2017-12-05 ENCOUNTER — Ambulatory Visit: Payer: Medicare Other | Admitting: Dietician

## 2017-12-14 ENCOUNTER — Ambulatory Visit
Admission: RE | Admit: 2017-12-14 | Discharge: 2017-12-14 | Disposition: A | Discharge status: Home / Self Care | Payer: Medicare Other | Source: Ambulatory Visit | Attending: Internal Medicine | Admitting: Internal Medicine

## 2017-12-14 DIAGNOSIS — Z1239 Encounter for other screening for malignant neoplasm of breast: Secondary | ICD-10-CM

## 2017-12-14 DIAGNOSIS — Z1231 Encounter for screening mammogram for malignant neoplasm of breast: Secondary | ICD-10-CM | POA: Diagnosis not present

## 2017-12-17 DIAGNOSIS — L2084 Intrinsic (allergic) eczema: Secondary | ICD-10-CM | POA: Diagnosis not present

## 2018-01-03 ENCOUNTER — Ambulatory Visit (INDEPENDENT_AMBULATORY_CARE_PROVIDER_SITE_OTHER): Payer: Medicare Other | Admitting: Internal Medicine

## 2018-01-03 ENCOUNTER — Encounter: Payer: Self-pay | Admitting: Internal Medicine

## 2018-01-03 DIAGNOSIS — Z87891 Personal history of nicotine dependence: Secondary | ICD-10-CM

## 2018-01-03 DIAGNOSIS — N750 Cyst of Bartholin's gland: Secondary | ICD-10-CM | POA: Diagnosis not present

## 2018-01-03 DIAGNOSIS — I10 Essential (primary) hypertension: Secondary | ICD-10-CM | POA: Diagnosis not present

## 2018-01-03 DIAGNOSIS — M47812 Spondylosis without myelopathy or radiculopathy, cervical region: Secondary | ICD-10-CM | POA: Diagnosis not present

## 2018-01-03 DIAGNOSIS — K5900 Constipation, unspecified: Secondary | ICD-10-CM

## 2018-01-03 DIAGNOSIS — E785 Hyperlipidemia, unspecified: Secondary | ICD-10-CM

## 2018-01-03 DIAGNOSIS — Z23 Encounter for immunization: Secondary | ICD-10-CM

## 2018-01-03 NOTE — Progress Notes (Signed)
   CC: genital lesion  HPI:  Miranda Gaines is a 62 y.o. with a PMH of DJD, HTN, HLD presenting to clinic for evaluation of genital lesion.  Patient states that she has a recurrent genital "boil" for years now, that usually recurs monthly and she thinks it is associated with constipation. The most recent episode lasted 3 weeks; she states she had swelling about the size of a quarter, and it eventually self drained; she reports using hot compresses and vaseline. She denies fevers, chills, nausea, vomiting; she is not sexually active; denies vaginal bleeding or discharge; denies dysuria, hematuria; denies melena or hematochezia.  Please see problem based Assessment and Plan for status of patients chronic conditions.  Past Medical History:  Diagnosis Date  . Degenerative disc disease, cervical    L spine 10/09 showing DJD and cervical spurring but no specific foraminal narrowing. Has been followed by Rochele PagesJanie Kelly, pain management physician in [place].  . Depression    [facility]  . GERD (gastroesophageal reflux disease)   . Herniated disc    L5-S1. s/p laminectomy in 1993 by Dr. Shelle IronBeane.  MRI June 2005 showing L4/5 disc bulge + mild facet arthropathy. F/b Dr. Marzetta BoardJane Keilitz for pain management. Wheel chair bound.  . Hyperlipidemia   . Hypertension   . Pyloric stenosis    s/p EGD with balloon dilatation on 06/23/2007 by Dr. Elnoria HowardHung  . Seborrheic dermatitis    of face, sees Dr. Merlyn AlbertFred Luptom, dermatop cream TID  . Tobacco abuse    Did not tolerate Chantix (vomiting)  . Wears hearing aid    In r. ear, followed by ENT. 2/2 recurrent otitis    Review of Systems:   Per HPI  Physical Exam:  Vitals:   01/03/18 0847  BP: 121/79  Pulse: 72  Temp: 98.4 F (36.9 C)  TempSrc: Oral  SpO2: 99%   GENERAL- alert, co-operative, appears as stated age, not in any distress. CARDIAC- RRR RESP- Moving equal volumes of air, and clear to auscultation bilaterally, no wheezes or  crackles. ABDOMEN- Soft, nontender, bowel sounds present. GU- minimally tender left, posterior labia minora pink lesion (5 o'clock position); flat with no associated fluctuance or swelling at the moment.  Assessment & Plan:   See Encounters Tab for problem based charting.   Patient discussed with Dr. Argentina PonderGranfortuna   Loan Oguin, MD Internal Medicine PGY-3

## 2018-01-03 NOTE — Assessment & Plan Note (Signed)
Mild constipation.  Plan: --advised patient use miralax prn

## 2018-01-03 NOTE — Addendum Note (Signed)
Addended by: Nyra MarketSVALINA, Afton Mikelson on: 01/03/2018 03:12 PM   Modules accepted: Orders

## 2018-01-03 NOTE — Patient Instructions (Addendum)
I will send you to gynecology for better evaluation and management, since you're having frequent recurrence of this bump.  Bartholin Cyst or Abscess A Bartholin cyst is a fluid-filled sac that forms on a Bartholin gland. Bartholin glands are small glands that are found in the folds of skin (labia) on the sides of the lower opening of the vagina. This type of cyst causes a bulge on the side of the vagina. A cyst that is not large or infected may not cause problems. However, if the fluid in the cyst becomes infected, the cyst can turn into an abscess. An abscess may cause discomfort or pain. Follow these instructions at home:  Take medicines only as told by your doctor.  If you were prescribed an antibiotic medicine, finish all of it even if you start to feel better.  Apply warm, wet compresses to the area or take warm, shallow baths that cover your pelvic area (sitz baths). Do this many times each day or as told by your doctor.  Do not squeeze the cyst. Do not apply heavy pressure to it.  Do not have sex until the cyst has gone away.  If your cyst or abscess was opened by your doctor, a small piece of gauze or a drain may have been placed in the area. That lets the cyst drain. Do not remove the gauze or the drain until your doctor tells you it is okay to do that.  Do not wear tampons. Wear feminine pads as needed for any fluid or blood.  Keep all follow-up visits as told by your doctor. This is important. Contact a doctor if:  Your pain, puffiness (swelling), or redness in the area of the cyst gets worse.  You have fluid or pus pus coming from the cyst.  You have a fever. This information is not intended to replace advice given to you by your health care provider. Make sure you discuss any questions you have with your health care provider. Document Released: 07/07/2008 Document Revised: 09/16/2015 Document Reviewed: 11/24/2013 Elsevier Interactive Patient Education  2018 [employer]

## 2018-01-03 NOTE — Assessment & Plan Note (Signed)
Patient with s/sx and exam consistent with bartholin cyst due to it's recurrent nature. Currently no sign of infection.  Plan: --referral to gyn for management --advised to use warm compresses at start of swelling

## 2018-01-03 NOTE — Progress Notes (Signed)
Medicine attending: Medical history, presenting problems, physical findings, and medications, reviewed with resident physician Dr Gorica Svalina on the day of the patient visit and I concur with her evaluation and management plan. 

## 2018-01-16 DIAGNOSIS — F332 Major depressive disorder, recurrent severe without psychotic features: Secondary | ICD-10-CM | POA: Diagnosis not present

## 2018-01-30 ENCOUNTER — Encounter: Payer: Medicare Other | Admitting: Obstetrics and Gynecology

## 2018-01-30 ENCOUNTER — Encounter: Payer: Self-pay | Admitting: Obstetrics and Gynecology

## 2018-01-31 NOTE — Progress Notes (Signed)
Patient did not keep her GYN appointment for 01/30/2018.  Lei Dower, Jr MD Attending [facility] (Faculty Practice)   

## 2018-02-26 ENCOUNTER — Other Ambulatory Visit: Payer: Self-pay | Admitting: Internal Medicine

## 2018-02-26 DIAGNOSIS — K219 Gastro-esophageal reflux disease without esophagitis: Secondary | ICD-10-CM

## 2018-04-09 DIAGNOSIS — F332 Major depressive disorder, recurrent severe without psychotic features: Secondary | ICD-10-CM | POA: Diagnosis not present

## 2018-04-29 ENCOUNTER — Other Ambulatory Visit: Payer: Self-pay | Admitting: Internal Medicine

## 2018-04-30 NOTE — Telephone Encounter (Signed)
Accurate dose. Bp well controlled. Refilled as requested

## 2018-05-07 ENCOUNTER — Encounter: Payer: Self-pay | Admitting: Internal Medicine

## 2018-05-08 NOTE — Addendum Note (Signed)
Addended by: Neomia Dear on: 05/08/2018 07:31 AM   Modules accepted: Orders

## 2018-05-21 ENCOUNTER — Other Ambulatory Visit: Payer: Self-pay | Admitting: Internal Medicine

## 2018-05-23 NOTE — Telephone Encounter (Signed)
amLODipine (NORVASC) 10 MG tablet, REFILL REQUEST @  [facility] - [place],  - [address] AT [address] [phone] (Phone) [phone] (Fax)

## 2018-07-03 ENCOUNTER — Other Ambulatory Visit: Payer: Self-pay | Admitting: Internal Medicine

## 2018-07-03 NOTE — Telephone Encounter (Signed)
Miranda Gaines with [facility] requesting a refill on amLODipine (NORVASC) 10 MG tablet.

## 2018-07-04 NOTE — Telephone Encounter (Signed)
amLODipine (NORVASC) 10 MG tablet 90 tablet 1 05/24/2018    Sig: TAKE 1 TABLET BY MOUTH ONCE DAILY   Sent to pharmacy as: amLODipine (NORVASC) 10 MG tablet   Notes to Pharmacy: Refill needed   E-Prescribing Status: Transmission to pharmacy failed (05/24/2018 1:52 PM EST)     Rx from 05/24/2018 "failed to transmit"-verbal authorization given to [employer].Marland KitchenKingsley Spittle Cassady3/12/202011:44 AM

## 2018-07-05 ENCOUNTER — Other Ambulatory Visit: Payer: Self-pay | Admitting: Internal Medicine

## 2018-07-05 DIAGNOSIS — L309 Dermatitis, unspecified: Secondary | ICD-10-CM

## 2018-07-09 ENCOUNTER — Encounter: Payer: Medicare Other | Admitting: Internal Medicine

## 2018-07-09 ENCOUNTER — Telehealth: Payer: Self-pay | Admitting: Internal Medicine

## 2018-07-09 MED ORDER — GUAIFENESIN-DM 100-10 MG/5ML PO SYRP: 5.0000 mL | ORAL_SOLUTION | ORAL | 0 refills | Status: DC | PRN

## 2018-07-09 MED ORDER — AMLODIPINE BESYLATE 10 MG PO TABS: 10.0000 mg | ORAL_TABLET | Freq: Every day | ORAL | 1 refills | Status: DC

## 2018-07-09 NOTE — Telephone Encounter (Signed)
   Reason for call:   I received a call from Ms. Daine Gravel Saliba at 5:30 PM indicating cold symptoms.   Pertinent Data:   Patient endorses cold symptoms for 3 days.  She states she's had a runny nose, congestion, and a cough productive of green sputum.  She denies chest pain, fever; she endorses shortness of breath but only during coughing spells.  She denies recent sick contacts.  She also has been out of amlodipine for 1 month.   Assessment / Plan / Recommendations:   Patient with s/sx consistent with viral infection; symptoms have been present for only a couple of days and have predominant congestion and rhinorrhea so a secondary pna at this time is less likely.   Patient unable to leave house and uses mail in pharmacy that takes 1wk to deliver. She didn't want medications sent to a local pharmacy but asked what OTC's she can get a friend to pick up at the store instead while she waits for her delivery. I will send in.  Advised that friend can speak to a pharmacist at the store for recommendations on cold/cough formulations for people with high blood pressure - she stated understanding but still wanted scripts sent to her mail in pharmacy as well.  I will refill her amlodipine and send in guiafenicin-dextrometorphan syrup.   Advised that if her symptoms don't improve in the next 5-7 days or she start having worsening symptoms with shortness of breath or chest tightness to get evaluated by our clinic.  As always, pt is advised that if symptoms worsen or new symptoms arise, they should go to an urgent care facility or to to ER for further evaluation.   Nyra Market, MD   07/09/2018, 5:44 PM

## 2018-07-10 ENCOUNTER — Telehealth: Payer: Self-pay | Admitting: Internal Medicine

## 2018-07-10 NOTE — Telephone Encounter (Signed)
   Reason for call:   I received a call from Ms. Dorthea Cove at  PM indicating that she was unable to obtain the cough syrup ordered by Dr. Samuella Cota yesterday as medicaid would not cover it and the friend she sent to the store to pick it up was unable to afford it or other options at the time.   Pertinent Data:   Patient request new cough medication to be sent that will be covered by her insurance.  She asks for other things she can do in the meantime and if she can take robitussin  She also verified that her blood pressure medicine was refilled (which it was)  Continues to deny SOB or Fevers   Assessment / Plan / Recommendations:   Patient advised that Robitussin is a good option and it was what was prescribed. Other OTC meds are also adequate.  She was informed that steam from warm showers can loosen secretions as well as nasal rinses if she has a product for that  Informed patient that there may not be a fully covered medication on her insurance, but I will look and order if this is an option, otherwise the ordered Robitussin DM is a good option  As always, pt is advised that if symptoms worsen or new symptoms arise, they should go to an urgent care facility or to to ER for further evaluation.   Beola Cord, MD   07/10/2018, 7:03 PM

## 2018-07-15 ENCOUNTER — Telehealth: Payer: Self-pay

## 2018-07-15 NOTE — Telephone Encounter (Signed)
Requesting to speak with a nurse about cold. Please call pt back. 

## 2018-07-16 NOTE — Telephone Encounter (Signed)
rTc, lm for rtc

## 2018-07-18 NOTE — Telephone Encounter (Signed)
No answer, lm for rtc 

## 2018-08-05 ENCOUNTER — Other Ambulatory Visit: Payer: Self-pay | Admitting: Student in an Organized Health Care Education/Training Program

## 2018-08-05 ENCOUNTER — Other Ambulatory Visit: Payer: Self-pay | Admitting: Internal Medicine

## 2018-08-05 DIAGNOSIS — B9789 Other viral agents as the cause of diseases classified elsewhere: Secondary | ICD-10-CM

## 2018-08-05 DIAGNOSIS — J329 Chronic sinusitis, unspecified: Principal | ICD-10-CM

## 2018-08-05 DIAGNOSIS — L309 Dermatitis, unspecified: Secondary | ICD-10-CM

## 2018-08-12 ENCOUNTER — Telehealth: Payer: Self-pay | Admitting: Internal Medicine

## 2018-08-12 ENCOUNTER — Other Ambulatory Visit: Payer: Self-pay | Admitting: Internal Medicine

## 2018-08-12 ENCOUNTER — Other Ambulatory Visit: Payer: Self-pay

## 2018-08-12 ENCOUNTER — Other Ambulatory Visit: Payer: Self-pay | Admitting: Oncology

## 2018-08-12 DIAGNOSIS — H60501 Unspecified acute noninfective otitis externa, right ear: Secondary | ICD-10-CM

## 2018-08-12 MED ORDER — CIPROFLOXACIN-DEXAMETHASONE 0.3-0.1 % OT SUSP: 4.0000 [drp] | Freq: Two times a day (BID) | OTIC | 0 refills | Status: DC

## 2018-08-12 NOTE — Telephone Encounter (Signed)
Pt states she cannot hear from the right ear, requesting ear drop. Please call pt back.

## 2018-08-12 NOTE — Telephone Encounter (Signed)
   Reason for call:   I received a call from Ms. Daine Gravel Forker at 5:30 pm indicating itchy, painful ear with decreased hearing.   Pertinent Data:   Patient reports worsening of ear pain, itching, and hearing on her Right side for about 2-3 weeks. She states that she can no longer hear out of that ear with her hearing aid in. She reports similar symptoms in th last year or so relieved with ear drops that "start with an 'O' ".   Denies pressure sensation, fever, cough, or other systemic symptoms. Had a cold previously, but it has improved.  Chart review shows she was previously treated with Ofloxacin drops, will reorder, med needs to be covered by her insurance (she emphasized this over the phone).   Assessment / Plan / Recommendations:   Suspect Otitis externa  Will prescribe Ciprodex drops 4 drops BID x7 days based on Medicaid coverage (see below)  Patient to call back if her symptoms fail to improve in the coming days   As always, pt is advised that if symptoms worsen or new symptoms arise, they should go to an urgent care facility or to to ER for further evaluation.   Beola Cord, MD   08/12/2018, 8:04 PM  Preferred Otic Abx per [place] Medicaid Formulary: - Ciprodex - Cortisporin

## 2018-08-12 NOTE — Telephone Encounter (Signed)
Returned pt's call - stated cannot hear out of right even with her hearing aids; also itching and burning x 3 weeks after having a cold last month. Requsting rx for ear drops; covered by her insurance (Medicaid). Send to [facility].

## 2018-08-13 NOTE — Telephone Encounter (Signed)
Note reviewed. Agree w plan per Dr Alinda Money. If no improvement, she needs an in person visit. DrG

## 2018-08-13 NOTE — Telephone Encounter (Signed)
See Dr Vesta Mixer telephone encounter.

## 2018-08-15 NOTE — Telephone Encounter (Signed)
review 

## 2018-08-29 ENCOUNTER — Other Ambulatory Visit: Payer: Self-pay | Admitting: Internal Medicine

## 2018-08-29 DIAGNOSIS — H60501 Unspecified acute noninfective otitis externa, right ear: Secondary | ICD-10-CM

## 2018-08-30 ENCOUNTER — Other Ambulatory Visit: Payer: Self-pay

## 2018-08-30 DIAGNOSIS — H60501 Unspecified acute noninfective otitis externa, right ear: Secondary | ICD-10-CM

## 2018-08-30 NOTE — Telephone Encounter (Signed)
ciprofloxacin-dexamethasone (CIPRODEX) OTIC suspension, refill request @  [facility] - [place], [place] - [address] AT [address] [phone] (Phone) [phone] (Fax)   Patient states she still have ear infection, please call pt back.

## 2018-08-30 NOTE — Telephone Encounter (Signed)
Pt calls and states her ear is no better and wants refill of abx. She is advised she will need appt for eval. She ask what she use until appt and is told not to insert anything into ear and to keep it clean and dry on outside. She will call transportation and then call back to make appt.

## 2018-09-03 NOTE — Telephone Encounter (Signed)
Per Epic, appt has been for 5/15.

## 2018-09-03 NOTE — Telephone Encounter (Signed)
Agree with re-assessment prior to further refills.

## 2018-09-06 ENCOUNTER — Encounter: Payer: Self-pay | Admitting: Internal Medicine

## 2018-09-06 ENCOUNTER — Ambulatory Visit (INDEPENDENT_AMBULATORY_CARE_PROVIDER_SITE_OTHER): Payer: Medicare Other | Admitting: Internal Medicine

## 2018-09-06 ENCOUNTER — Other Ambulatory Visit: Payer: Self-pay

## 2018-09-06 DIAGNOSIS — H60501 Unspecified acute noninfective otitis externa, right ear: Secondary | ICD-10-CM

## 2018-09-06 DIAGNOSIS — H6091 Unspecified otitis externa, right ear: Secondary | ICD-10-CM

## 2018-09-06 MED ORDER — CIPROFLOXACIN-DEXAMETHASONE 0.3-0.1 % OT SUSP: 4.0000 [drp] | Freq: Two times a day (BID) | OTIC | 0 refills | Status: DC

## 2018-09-06 NOTE — Assessment & Plan Note (Signed)
Patient with history of chronic sinusitis reports for the past two weeks she has had ear pain, itching, reduced hearing and she notices that a pressure develops which can be relieved when she swallows. Three weeks ago she called the on call pager and was prescribed ciprodex for suspicion of otitis externa. Today she presents for follow up. She has had reoccurence of this problem numerous times in the past, it usually relieves with drops.  On exam there is no tenderness of the mastoid process, there is mild tenderness with manipulation of the pinna. There is thick black ear wax in the ear canal. The ear was irrigated which showed erythema of the external canal. After irrigation the tympanic membrane can be visualized and is without an effusion.  I recommended ENT follow up for recurrent otitis externa secondary to eustachian tube dysfunction but Miranda Gaines declined this. She has seen otolaryngology in the past and describes them addressing the recurrent otitis externa and explaining that she needs regular irrigation of the ears. She is on appropriate treatment for environmental allergies. No history of diabetes.  - Prescribed a course of ciprofloxacin ear drops

## 2018-09-06 NOTE — Progress Notes (Signed)
   CC: evaluation of ear discomfort   HPI:  [name] is a 63 y.o. with PMH as listed below who presents for evaluation of ear discomfort . Please see the assessment and plans for the status of the patient chronic medical problems.    Past Medical History:  Diagnosis Date  . Degenerative disc disease, cervical    L spine 10/09 showing DJD and cervical spurring but no specific foraminal narrowing. Has been followed by Miranda Gaines, pain management physician in [place].  . Depression    f/b Dr. Cardell Gaines at [facility]  . GERD (gastroesophageal reflux disease)   . Herniated disc    L5-S1. s/p laminectomy in 1993 by Dr. Shelle Gaines.  MRI June 2005 showing L4/5 disc bulge + mild facet arthropathy. F/b Dr. Marzetta Gaines for pain management. Wheel chair bound.  . Hyperlipidemia   . Hypertension   . Pyloric stenosis    s/p EGD with balloon dilatation on 06/23/2007 by Dr. Elnoria Gaines  . Seborrheic dermatitis    of face, sees Dr. Merlyn Albert Gaines, dermatop cream TID  . Tobacco abuse    Did not tolerate Chantix (vomiting)  . Wears hearing aid    In r. ear, followed by ENT. 2/2 recurrent otitis   Review of Systems:  Refer to history of present illness and assessment and plans for pertinent review of systems, all others reviewed and negative  Physical Exam:  Vitals:   09/06/18 0913  BP: 131/82  Pulse: 85  Temp: 98.2 F (36.8 C)  TempSrc: Oral  SpO2: 100%  Height: 5' 4.5" (1.638 m)   General: well appearing, not in acute distress  HEENT: On initial inspection of the right ear there is tenderness with manipulation of the pinna and a copious amount of dark wax within the ear. After irrigation the tympanic membrane can be visualized and is without an effusion. There is mild erythema of the external ear canal.   Assessment & Plan:   Recurrent Otitis Externa  Patient with history of chronic sinusitis reports for the past two weeks she has had ear pain, itching, reduced hearing and she notices  that a pressure develops which can be relieved when she swallows. Three weeks ago she called the on call pager and was prescribed ciprodex for suspicion of otitis externa. Today she presents for follow up. She has had reoccurence of this problem numerous times in the past, it usually relieves with drops.  On exam there is no tenderness of the mastoid process, there is mild tenderness with manipulation of the pinna. There is thick black ear wax in the ear canal. The ear was irrigated which showed erythema of the external canal. After irrigation the tympanic membrane can be visualized and is without an effusion.  I recommended ENT follow up for recurrent otitis externa secondary to eustachian tube dysfunction but Ms. Miranda Gaines declined this. She has seen otolaryngology in the past and describes them addressing the recurrent otitis externa and explaining that she needs regular irrigation of the ears. She is on appropriate treatment for environmental allergies. No history of diabetes.  - Prescribed a course of ciprofloxacin ear drops   See Encounters Tab for problem based charting.  Patient discussed with Dr. Heide Spark

## 2018-09-06 NOTE — Patient Instructions (Addendum)
Thank you for coming to the clinic today. It was a pleasure to see you.   For your ear pain - we irrigated your ear today. Please restart using ciprofloxacin ear drops, they will work better now.  I believe you have eustatian tube dysfunction which is leading to your recurrent ear infection so I have referred you to see an ENT doctor.   Please call the internal medicine center clinic if you have any questions or concerns, we may be able to help and keep you from a long and expensive emergency room wait. Our clinic and after hours phone number is [phone], the best time to call is Monday through Friday 9 am to 4 pm but there is always someone available 24/7 if you have an emergency. If you need medication refills please notify your pharmacy one week in advance and they will send [place] a request.

## 2018-09-06 NOTE — Progress Notes (Signed)
Internal Medicine Clinic Attending  Case discussed with Dr. Blum at the time of the visit.  We reviewed the resident's history and exam and pertinent patient test results.  I agree with the assessment, diagnosis, and plan of care documented in the resident's note. 

## 2018-09-24 ENCOUNTER — Other Ambulatory Visit: Payer: Self-pay | Admitting: Internal Medicine

## 2018-09-24 DIAGNOSIS — H60501 Unspecified acute noninfective otitis externa, right ear: Secondary | ICD-10-CM

## 2018-10-02 ENCOUNTER — Other Ambulatory Visit: Payer: Self-pay | Admitting: *Deleted

## 2018-10-02 DIAGNOSIS — J329 Chronic sinusitis, unspecified: Secondary | ICD-10-CM

## 2018-10-02 DIAGNOSIS — B9789 Other viral agents as the cause of diseases classified elsewhere: Secondary | ICD-10-CM

## 2018-10-03 MED ORDER — FLUTICASONE PROPIONATE 50 MCG/ACT NA SUSP: NASAL | 0 refills | Status: DC

## 2018-10-07 DIAGNOSIS — M4727 Other spondylosis with radiculopathy, lumbosacral region: Secondary | ICD-10-CM | POA: Diagnosis not present

## 2018-10-07 DIAGNOSIS — M5416 Radiculopathy, lumbar region: Secondary | ICD-10-CM | POA: Diagnosis not present

## 2018-10-07 DIAGNOSIS — M961 Postlaminectomy syndrome, not elsewhere classified: Secondary | ICD-10-CM | POA: Diagnosis not present

## 2018-11-13 ENCOUNTER — Other Ambulatory Visit: Payer: Self-pay | Admitting: *Deleted

## 2018-11-13 MED ORDER — ATORVASTATIN CALCIUM 80 MG PO TABS: ORAL_TABLET | ORAL | 0 refills | Status: DC

## 2018-11-28 ENCOUNTER — Other Ambulatory Visit: Payer: Self-pay | Admitting: Internal Medicine

## 2018-11-28 DIAGNOSIS — B9789 Other viral agents as the cause of diseases classified elsewhere: Secondary | ICD-10-CM

## 2018-11-28 DIAGNOSIS — K219 Gastro-esophageal reflux disease without esophagitis: Secondary | ICD-10-CM

## 2019-01-02 ENCOUNTER — Other Ambulatory Visit: Payer: Self-pay | Admitting: Internal Medicine

## 2019-01-20 ENCOUNTER — Other Ambulatory Visit: Payer: Self-pay | Admitting: Internal Medicine

## 2019-01-20 DIAGNOSIS — Z1231 Encounter for screening mammogram for malignant neoplasm of breast: Secondary | ICD-10-CM

## 2019-01-29 ENCOUNTER — Other Ambulatory Visit: Payer: Self-pay

## 2019-01-29 ENCOUNTER — Ambulatory Visit (INDEPENDENT_AMBULATORY_CARE_PROVIDER_SITE_OTHER): Payer: Medicare Other | Admitting: *Deleted

## 2019-01-29 DIAGNOSIS — Z23 Encounter for immunization: Secondary | ICD-10-CM

## 2019-01-31 ENCOUNTER — Other Ambulatory Visit: Payer: Self-pay | Admitting: Internal Medicine

## 2019-01-31 DIAGNOSIS — E785 Hyperlipidemia, unspecified: Secondary | ICD-10-CM

## 2019-03-07 ENCOUNTER — Other Ambulatory Visit: Payer: Self-pay | Admitting: Internal Medicine

## 2019-03-07 ENCOUNTER — Other Ambulatory Visit: Payer: Self-pay

## 2019-03-07 ENCOUNTER — Ambulatory Visit
Admission: RE | Admit: 2019-03-07 | Discharge: 2019-03-07 | Disposition: A | Discharge status: Home / Self Care | Payer: Medicare Other | Source: Ambulatory Visit | Attending: Internal Medicine | Admitting: Internal Medicine

## 2019-03-07 DIAGNOSIS — Z1231 Encounter for screening mammogram for malignant neoplasm of breast: Secondary | ICD-10-CM

## 2019-03-18 ENCOUNTER — Telehealth: Payer: Self-pay | Admitting: *Deleted

## 2019-03-18 ENCOUNTER — Other Ambulatory Visit: Payer: Self-pay

## 2019-03-18 ENCOUNTER — Ambulatory Visit (INDEPENDENT_AMBULATORY_CARE_PROVIDER_SITE_OTHER): Payer: Medicare Other | Admitting: Internal Medicine

## 2019-03-18 ENCOUNTER — Encounter: Payer: Self-pay | Admitting: Internal Medicine

## 2019-03-18 VITALS — BP 116/68 | HR 73 | Temp 98.2°F | Ht 64.0 in | Wt 173.0 lb

## 2019-03-18 DIAGNOSIS — Z7409 Other reduced mobility: Secondary | ICD-10-CM

## 2019-03-18 DIAGNOSIS — K219 Gastro-esophageal reflux disease without esophagitis: Secondary | ICD-10-CM

## 2019-03-18 DIAGNOSIS — K5909 Other constipation: Secondary | ICD-10-CM | POA: Diagnosis not present

## 2019-03-18 DIAGNOSIS — M4726 Other spondylosis with radiculopathy, lumbar region: Secondary | ICD-10-CM | POA: Diagnosis not present

## 2019-03-18 DIAGNOSIS — Z993 Dependence on wheelchair: Secondary | ICD-10-CM | POA: Diagnosis not present

## 2019-03-18 DIAGNOSIS — K5900 Constipation, unspecified: Secondary | ICD-10-CM

## 2019-03-18 DIAGNOSIS — M5127 Other intervertebral disc displacement, lumbosacral region: Secondary | ICD-10-CM | POA: Diagnosis not present

## 2019-03-18 DIAGNOSIS — Z87891 Personal history of nicotine dependence: Secondary | ICD-10-CM

## 2019-03-18 DIAGNOSIS — Z79899 Other long term (current) drug therapy: Secondary | ICD-10-CM | POA: Diagnosis not present

## 2019-03-18 DIAGNOSIS — E785 Hyperlipidemia, unspecified: Secondary | ICD-10-CM

## 2019-03-18 DIAGNOSIS — Z86010 Personal history of colonic polyps: Secondary | ICD-10-CM | POA: Diagnosis not present

## 2019-03-18 DIAGNOSIS — I10 Essential (primary) hypertension: Secondary | ICD-10-CM | POA: Diagnosis not present

## 2019-03-18 MED ORDER — METAMUCIL SMOOTH TEXTURE 58.6 % PO POWD: 1.0000 | Freq: Three times a day (TID) | ORAL | 12 refills | Status: DC

## 2019-03-18 NOTE — Assessment & Plan Note (Signed)
Presents w/ complaint of malfunctioning power wheelchair. Mobility is limited due to chronic spinal issue w/ herniated disc in L5-S1 s/p laminectomy and significant degenerative joint disease in both knees and lumbar spine. On exam, she has deconditioned limbs and is unable to ambulate safely without use of a wheelchair. Mentions difficulty with mobility outside of home due to malfunctioning with inability to accelerate the power wheelchair.  - Contact to DME provider for repair/replacement order

## 2019-03-18 NOTE — Progress Notes (Signed)
CC: Constipation  HPI: Miranda Gaines is a 63 y.o. F w/ PMH of DJD of lumbar spine with radiculopathy, L5-S1 herniated disc s/p laminectomy, HTN, GERD and HLD who presents w/ complaints of constipation. She mentions that this is a chronic issue mentions prior hx of over-using laxatives and advised to use OTC products. Currently she is using prune juice at home but continues to have only 1 bowel movement per week. She denies any melena, intermittent diarrhea, abdominal pain, cramping, fevers, chills, nausea or vomiting. She mentions that she believes some dietary triggers may be the cause although she cannot pinpoint a specific food. She mentions this issue has been ongoing since her back surgery in the 90s.   She also mentions that her wheelchair is malfunctioning and it requires repair.   Past Medical History:  Diagnosis Date  . Degenerative disc disease, cervical    L spine 10/09 showing DJD and cervical spurring but no specific foraminal narrowing. Has been followed by Laurena Spies, pain management physician in [place].  . Depression    f/b Dr. Francina Ames at [facility]  . GERD (gastroesophageal reflux disease)   . Herniated disc    L5-S1. s/p laminectomy in 1993 by Dr. Tonita Cong.  MRI June 2005 showing L4/5 disc bulge + mild facet arthropathy. F/b Dr. Marjean Donna for pain management. Wheel chair bound.  . Hyperlipidemia   . Hypertension   . Pyloric stenosis    s/p EGD with balloon dilatation on 06/23/2007 by Dr. Benson Norway  . Seborrheic dermatitis    of face, sees Dr. Josph Macho Luptom, dermatop cream TID  . Tobacco abuse    Did not tolerate Chantix (vomiting)  . Wears hearing aid    In r. ear, followed by ENT. 2/2 recurrent otitis    Review of Systems: Review of Systems  Constitutional: Negative for chills, fever and malaise/fatigue.  HENT: Negative for sore throat.   Eyes: Negative for blurred vision.  Respiratory: Negative for cough and shortness of breath.   Cardiovascular:  Negative for chest pain, palpitations and leg swelling.  Gastrointestinal: Negative for constipation, diarrhea, nausea and vomiting.  Musculoskeletal: Positive for back pain and joint pain.  All other systems reviewed and are negative.    Physical Exam: Vitals:   03/18/19 1022 03/18/19 1113  BP: (!) 144/68 116/68  Pulse: 73   Temp: 98.2 F (36.8 C)   TempSrc: Oral   SpO2: 99%   Weight: 173 lb (78.5 kg)   Height: 5\' 4"  (1.626 m)     Physical Exam  Constitutional: She is oriented to person, place, and time. She appears well-developed and well-nourished. No distress.  Cardiovascular: Normal rate, regular rhythm, normal heart sounds and intact distal pulses.  No murmur heard. Respiratory: Effort normal and breath sounds normal. No respiratory distress. She has no wheezes. She has no rales.  GI: Soft. Bowel sounds are normal. She exhibits no distension. There is no abdominal tenderness.  Musculoskeletal:        General: No edema.  Neurological: She is alert and oriented to person, place, and time.  Neurologic exam: Mental status: A&Ox3 Motor: Strength 5/5 on both upper extremities. 3/5 on bilateral lower extremities, muscle tone poor in lower extremities.  Deep Tendon Reflexes: 2+ symmetric Sensory: Light touch intact and symmetric bilaterally  Psychiatric: Normal mood and affect    Assessment & Plan:   Impaired physical mobility Presents w/ complaint of malfunctioning power wheelchair. Mobility is limited due to chronic spinal issue w/ herniated disc  in L5-S1 s/p laminectomy and significant degenerative joint disease in both knees and lumbar spine. On exam, she has deconditioned limbs and is unable to ambulate safely without use of a wheelchair. Mentions difficulty with mobility outside of home due to malfunctioning with inability to accelerate the power wheelchair.  - Contact to DME provider for repair/replacement order  Constipation Presents w/ complaints of constipation.  Denies any prior hx before her lumbar spine issues. Denies any saddle anesthesia, bowel or urinary incontinence. Denies any abdominal pain, nausea or vomiting. Prior colonoscopy from 2014 reviewed showing 1 53mm polyp. Medications at home reviewed possibly due to nortriptyline, trazodone or hydroxyzine. Patient requesting supportive care. Mentions she wants to try to orange powder. Image search revealed Metamucil as medication. Discussed also lifestyle modifications to promote regular bowel movement such as exercise.  - Recommended to exercise regularly, OTC laxative as needed  Essential hypertension BP Readings from Last 3 Encounters:  03/18/19 116/68  09/06/18 131/82  01/03/18 121/79   BP at goal. On amlodipine only. Denies any light-headedness, dizziness, chest pain, palpitations, leg swelling.  - C/w amlodipine 10mg      Patient discussed with Dr.   -Criselda Peaches, PGY2 [facility] Internal Medicine Pager: [phone]

## 2019-03-18 NOTE — Telephone Encounter (Signed)
Today's OV visit has been completed

## 2019-03-18 NOTE — Assessment & Plan Note (Signed)
BP Readings from Last 3 Encounters:  03/18/19 116/68  09/06/18 131/82  01/03/18 121/79   BP at goal. On amlodipine only. Denies any light-headedness, dizziness, chest pain, palpitations, leg swelling.  - C/w amlodipine 10mg 

## 2019-03-18 NOTE — Patient Instructions (Addendum)
Dear [name],  Thank you for allowing [place] to provide your care today. Today we discussed your constipation    I have ordered no labs for you. I will call if any are abnormal.    Today we made the following changes to your medications:    Start fiber supplements (Metamucil)  Please follow-up in 6 months.    Should you have any questions or concerns please call the internal medicine clinic at [phone].    Thank you for choosing [place].   Constipation, Adult Constipation is when a person:  Poops (has a bowel movement) fewer times in a week than normal.  Has a hard time pooping.  Has poop that is dry, hard, or bigger than normal. Follow these instructions at home: Eating and drinking   Eat foods that have a lot of fiber, such as: ? Fresh fruits and vegetables. ? Whole grains. ? Beans.  Eat less of foods that are high in fat, low in fiber, or overly processed, such as: ? [place] fries. ? Hamburgers. ? Cookies. ? Candy. ? Soda.  Drink enough fluid to keep your pee (urine) clear or pale yellow. General instructions  Exercise regularly or as told by your doctor.  Go to the restroom when you feel like you need to poop. Do not hold it in.  Take over-the-counter and prescription medicines only as told by your doctor. These include any fiber supplements.  Do pelvic floor retraining exercises, such as: ? Doing deep breathing while relaxing your lower belly (abdomen). ? Relaxing your pelvic floor while pooping.  Watch your condition for any changes.  Keep all follow-up visits as told by your doctor. This is important. Contact a doctor if:  You have pain that gets worse.  You have a fever.  You have not pooped for 4 days.  You throw up (vomit).  You are not hungry.  You lose weight.  You are bleeding from the anus.  You have thin, pencil-like poop (stool). Get help right away if:  You have a fever, and your symptoms suddenly get  worse.  You leak poop or have blood in your poop.  Your belly feels hard or bigger than normal (is bloated).  You have very bad belly pain.  You feel dizzy or you faint. This information is not intended to replace advice given to you by your health care provider. Make sure you discuss any questions you have with your health care provider. Document Released: 09/27/2007 Document Revised: 03/23/2017 Document Reviewed: 09/29/2015 Elsevier Patient Education  2020 [employer].

## 2019-03-18 NOTE — Telephone Encounter (Signed)
Jacob Moores, Orvis Brill, RN        Hello Lauren -   At the end of last year our repair team was short on help and we were using a couple other companies to assist with repairs. We are now handling our own repairs again.    We just pulled patient's insurance and it doesn't show that any repairs were done in the past year by a different company, so we can move forward. I pulled the order and sent it to our repairs team and they have already contacted patient and left a VM to call them back to discuss repairs. Thank you for reaching out!

## 2019-03-18 NOTE — Telephone Encounter (Signed)
Patient here today to discuss need for power w/c repairs. States she received chair through [employer] but has been told by them she would need to contact [employer] for repairs. Community message sent to Andria Rhein at [employer] to learn next steps. Hubbard Hartshorn, BSN, RN-BC

## 2019-03-18 NOTE — Assessment & Plan Note (Addendum)
Presents w/ complaints of constipation. Denies any prior hx before her lumbar spine issues. Denies any saddle anesthesia, bowel or urinary incontinence. Denies any abdominal pain, nausea or vomiting. Prior colonoscopy from 2014 reviewed showing 1 85mm polyp. Medications at home reviewed possibly due to nortriptyline, trazodone or hydroxyzine. Patient requesting supportive care. Mentions she wants to try to orange powder. Image search revealed Metamucil as medication. Discussed also lifestyle modifications to promote regular bowel movement such as exercise.  - Recommended to exercise regularly, OTC laxative as needed

## 2019-03-19 DIAGNOSIS — F5101 Primary insomnia: Secondary | ICD-10-CM | POA: Diagnosis not present

## 2019-03-19 DIAGNOSIS — F332 Major depressive disorder, recurrent severe without psychotic features: Secondary | ICD-10-CM | POA: Diagnosis not present

## 2019-03-24 NOTE — Progress Notes (Signed)
Internal Medicine Clinic Attending ° °Case discussed with Dr. Lee at the time of the visit.  We reviewed the resident’s history and exam and pertinent patient test results.  I agree with the assessment, diagnosis, and plan of care documented in the resident’s note.  °

## 2019-04-30 ENCOUNTER — Telehealth: Payer: Self-pay | Admitting: Internal Medicine

## 2019-04-30 NOTE — Telephone Encounter (Signed)
Pt calling to report that she has been waiting since November to have her PWC Repaired.  Per pt [facility] did contact her and stated she needed a new battery but her warranty had expired.  Pt states she was given the  [employer] number to contact  @ [phone].  Please call patient back.

## 2019-04-30 NOTE — Telephone Encounter (Signed)
Per phone note started 03/18/2019, the Repairs Dept at [employer] was to contact patient for repairs. Community message sent to Zenia Resides at [employer] to learn status. Kinnie Feil, BSN, RN-BC

## 2019-05-01 NOTE — Telephone Encounter (Signed)
Glynn Octave, Nickola Major, RN  Lauren -   Sorry for all the confusion around this. Patient called repair team back and we are scheduled to replace her batteries on Monday, 05/05/19. Thank YOU!!!

## 2019-06-09 DIAGNOSIS — F5101 Primary insomnia: Secondary | ICD-10-CM | POA: Diagnosis not present

## 2019-06-09 DIAGNOSIS — F332 Major depressive disorder, recurrent severe without psychotic features: Secondary | ICD-10-CM | POA: Diagnosis not present

## 2019-06-11 ENCOUNTER — Other Ambulatory Visit: Payer: Self-pay | Admitting: Internal Medicine

## 2019-06-11 DIAGNOSIS — K219 Gastro-esophageal reflux disease without esophagitis: Secondary | ICD-10-CM

## 2019-06-11 NOTE — Telephone Encounter (Signed)
May June PCP appt

## 2019-07-16 ENCOUNTER — Ambulatory Visit: Payer: Medicare Other | Admitting: Dietician

## 2019-07-16 NOTE — Progress Notes (Signed)
Start time:1008 End time: 27  TOI STELLY was seen today for a complaint of constipation. She reports having 1 bowel movement per week lately. In summary, her diet recall included a large portion of high fat, high sugar and highly processed foods. She did not buy the metamucil, she is drinking prune juice and eating prunes almost daily with no relief.   Food recall included: sausage, grits, rice, potatoes, chips, gummi bears, hershey kisses, applesauce, prunes, apple juice, whole wheat bread, animal crackers, pancakes with syrup, jam, applesauce, grapes, bananas, cabbage, broccoli, melons, strawberries. Cornbread, pasta, baked chicken, fish, eggs, shrimp  Beverages: drinks water, ginger ale soda, punch Plan is for her to gradually increase her fiber and nutrient quality by adding more fruits, vegetables and whole grains to her daily intake. A sample of ground flax seeds was proved to patient today with instruction on on how to use it. Follow up in 4 weeks Norm Parcel, RD 07/16/2019 11:28 AM.

## 2019-07-16 NOTE — Patient Instructions (Addendum)
Ideas to increase your fiber to help prevent and treat your CONSTIPATION:(bloating)  Sweet potato instead of white potato Whole wheat pasta instead of white pasta Broccoli, collard greens, corn on the cob, string beans, cabbage Whole wheat crackers/vegetables/fruits instead of chips and candy  Bananas, applesauce any fruit instead of candy, low fiber snacks like chips  Keep eating eggs, chicken, shrimp, fish Olive oil is great!!! Applesauce is better than apple juice Oatmeal or whole grain pancakes instead of pancakes or grits [place] sausage instead of pork sausage Tuna or ham instead of monkey sausage/vienna sausage or pork breakfast sausage  Better drinks to drink- WATER, WATER, WATER, add ginger to your hot water, add lemon juice to your hot water, homemade tea, only buy 100% juice and limit juice to 1 small cup each day  Use ground FLAXSEED EVERY DAY in the morning: Week 1- Eat 1 tablespoon mixed in applesauce Week 2- Eat  2 Tablespoons mixed in applesauce  Week 3 and every week thereafter- eat 3 tablespoons mixed into applesauce  Limit greasy and sugary foods to only 1 servings from this group each day: sausage, cheese, soda juice, chips, Gummi Bears, syrup, jam, apple butter   you can buy more ground Flaxseed at [employer] or [facility]  Usually less than 5$.  Please write down dates belwo that yo ave a bowel mvement:  3/24- 3/25 3/26 3/27 3/28 3/29 3/30 3/31 4/1 4/2 4/3 4/4 4/5 4/6 4/7

## 2019-08-08 ENCOUNTER — Other Ambulatory Visit: Payer: Self-pay | Admitting: *Deleted

## 2019-08-08 ENCOUNTER — Telehealth: Payer: Self-pay | Admitting: Dietician

## 2019-08-08 ENCOUNTER — Ambulatory Visit: Payer: Medicare Other | Admitting: Dietician

## 2019-08-08 ENCOUNTER — Encounter: Payer: Self-pay | Admitting: Dietician

## 2019-08-08 MED ORDER — AMLODIPINE BESYLATE 10 MG PO TABS: 10.0000 mg | ORAL_TABLET | Freq: Every day | ORAL | 1 refills | Status: DC

## 2019-08-08 NOTE — Progress Notes (Signed)
Start time:1008 End time: 12  Miranda Gaines was seen today for a complaint of constipation. Her records show she is  having 2-3 soft bowel movement per week lately after starting the month off with hard bowel movements. She has been using th flax seeds 3 tablespoons a day and a few other fiber containing foods.  Her diet recall today included excess saturated fat and processed foods. She also reports daily exercise, but spends most of the day in her wheelchair. She'd like to attend chair yoga at [facility] but does not think [facility] will take her.  Plan is for her to gradually increase her fiber and nutrient quality by adding more fruits, vegetables and whole grains to her daily intake. A sample of pintos beans and a hand weight was provided to patient today with instruction on on how to use it. Follow up in 4 weeks. Consider alternatives to medication that can cause constipation like nortriptyline, flexeril and trazadone Norm Parcel, RD 08/08/2019 11:14 AM.

## 2019-08-08 NOTE — Telephone Encounter (Signed)
-----   Message from [employer] sent at 08/08/2019 12:09 PM EDT ----- Regarding: RE: [facility] If she is certified, yes.  ----- Message ----- From: Santosha Jividen, Cecil Cranker, RD Sent: 08/08/2019  11:21 AM EDT To: Joice Lofts Chrismon Subject: [facility]                                           Do you now if [facility] will take her to [facility] to exercise?   Thank you!  Lupita Leash

## 2019-08-08 NOTE — Telephone Encounter (Signed)
Patient informed and verbalized understanding

## 2019-08-08 NOTE — Patient Instructions (Signed)
Eat as  Many foods with 3 or more grams of fiber- HIGH FIBER.  Constipation can be helped by eating foods with 5 grams of fiber  Decrease sugar to 2 teaspoons each cup of coffee.   consider 1 cup of coffee a day with a light breakfast Examples of a light breakfast- 1- Oatmeal made with milk counts as protein and a whole grain starch 2- yogurt and fruit 3- whole grain toast with peanut butter and banana or raisins  A balanced plate includes: 1- PROTEIN- Tuna/pink salmon,shrimp, [place], beans, black eyed peas chicken, yogurt, egg 2- Whole grain STARCH- brown rice, whole wheat bread 3- VEGETABLE- 2-3 cups every day  4- FRUIT- if you are too full to eat with your meal- can eat snack use whole fruit not juice  Mixed greens, cabbage, [place] sausage, sliced [place] sandwich

## 2019-08-18 ENCOUNTER — Other Ambulatory Visit: Payer: Self-pay | Admitting: Internal Medicine

## 2019-08-18 DIAGNOSIS — E785 Hyperlipidemia, unspecified: Secondary | ICD-10-CM

## 2019-08-18 DIAGNOSIS — B9789 Other viral agents as the cause of diseases classified elsewhere: Secondary | ICD-10-CM

## 2019-08-18 DIAGNOSIS — J329 Chronic sinusitis, unspecified: Secondary | ICD-10-CM

## 2019-08-18 MED ORDER — ATORVASTATIN CALCIUM 80 MG PO TABS: ORAL_TABLET | ORAL | 3 refills | Status: DC

## 2019-09-01 DIAGNOSIS — F332 Major depressive disorder, recurrent severe without psychotic features: Secondary | ICD-10-CM | POA: Diagnosis not present

## 2019-09-01 DIAGNOSIS — F5101 Primary insomnia: Secondary | ICD-10-CM | POA: Diagnosis not present

## 2019-09-11 ENCOUNTER — Ambulatory Visit: Payer: Medicare Other | Attending: Internal Medicine

## 2019-09-11 DIAGNOSIS — Z23 Encounter for immunization: Secondary | ICD-10-CM

## 2019-09-11 NOTE — Progress Notes (Signed)
   Covid-19 Vaccination Clinic  Name:  Miranda Gaines    MRN: [id number] DOB: [birth year]  09/11/2019  [name] was observed post Covid-19 immunization for 15 minutes without incident. She was provided with Vaccine Information Sheet and instruction to access the V-Safe system.   [name] was instructed to call 911 with any severe reactions post vaccine: Marland Kitchen Difficulty breathing  . Swelling of face and throat  . A fast heartbeat  . A bad rash all over body  . Dizziness and weakness   Immunizations Administered    Name Date Dose VIS Date Route   Pfizer COVID-19 Vaccine 09/11/2019  9:33 AM 0.3 mL 06/18/2018 Intramuscular   Manufacturer: [employer], [employer]   Lot: [id number]   NDC: [address]-5

## 2019-09-12 ENCOUNTER — Other Ambulatory Visit: Payer: Self-pay | Admitting: Internal Medicine

## 2019-09-23 ENCOUNTER — Ambulatory Visit: Payer: Medicare Other | Admitting: Dietician

## 2019-09-23 ENCOUNTER — Encounter: Payer: Medicare Other | Admitting: Internal Medicine

## 2019-09-30 ENCOUNTER — Encounter: Payer: Self-pay | Admitting: Dietician

## 2019-09-30 ENCOUNTER — Encounter: Payer: Self-pay | Admitting: Internal Medicine

## 2019-09-30 ENCOUNTER — Ambulatory Visit (INDEPENDENT_AMBULATORY_CARE_PROVIDER_SITE_OTHER): Payer: Medicare Other | Admitting: Internal Medicine

## 2019-09-30 ENCOUNTER — Other Ambulatory Visit: Payer: Self-pay

## 2019-09-30 ENCOUNTER — Other Ambulatory Visit: Payer: Self-pay | Admitting: Internal Medicine

## 2019-09-30 ENCOUNTER — Ambulatory Visit: Payer: Medicare Other | Admitting: Dietician

## 2019-09-30 VITALS — BP 142/71 | HR 70 | Temp 98.2°F | Ht 64.0 in | Wt 160.8 lb

## 2019-09-30 DIAGNOSIS — E785 Hyperlipidemia, unspecified: Secondary | ICD-10-CM

## 2019-09-30 DIAGNOSIS — H6123 Impacted cerumen, bilateral: Secondary | ICD-10-CM

## 2019-09-30 DIAGNOSIS — H6091 Unspecified otitis externa, right ear: Secondary | ICD-10-CM | POA: Diagnosis not present

## 2019-09-30 DIAGNOSIS — I10 Essential (primary) hypertension: Secondary | ICD-10-CM | POA: Diagnosis not present

## 2019-09-30 MED ORDER — CIPROFLOXACIN-DEXAMETHASONE 0.3-0.1 % OT SUSP: 4.0000 [drp] | Freq: Two times a day (BID) | OTIC | 0 refills | Status: DC

## 2019-09-30 MED ORDER — DEBROX 6.5 % OT SOLN: 5.0000 [drp] | Freq: Two times a day (BID) | OTIC | 2 refills | Status: DC

## 2019-09-30 NOTE — Patient Instructions (Signed)
Dear [name],  Thank you for allowing [place] to provide your care today. Today we discussed your ear itching    I have ordered bmp labs for you. I will call if any are abnormal.    Today we made the following changes to your medications:    Please use Debrox ear drops to break up the ear wax, especially in your right ear  Please follow-up in 6 months.    Should you have any questions or concerns please call the internal medicine clinic at [phone].    Thank you for choosing [facility] [place].   Earwax Buildup, Adult The ears produce a substance called earwax that helps keep bacteria out of the ear and protects the skin in the ear canal. Occasionally, earwax can build up in the ear and cause discomfort or hearing loss. What increases the risk? This condition is more likely to develop in people who:  Are female.  Are elderly.  Naturally produce more earwax.  Clean their ears often with cotton swabs.  Use earplugs often.  Use in-ear headphones often.  Wear hearing aids.  Have narrow ear canals.  Have earwax that is overly thick or sticky.  Have eczema.  Are dehydrated.  Have excess hair in the ear canal. What are the signs or symptoms? Symptoms of this condition include:  Reduced or muffled hearing.  A feeling of fullness in the ear or feeling that the ear is plugged.  Fluid coming from the ear.  Ear pain.  Ear itch.  Ringing in the ear.  Coughing.  An obvious piece of earwax that can be seen inside the ear canal. How is this diagnosed? This condition may be diagnosed based on:  Your symptoms.  Your medical history.  An ear exam. During the exam, your health care provider will look into your ear with an instrument called an otoscope. You may have tests, including a hearing test. How is this treated? This condition may be treated by:  Using ear drops to soften the earwax.  Having the earwax removed by a health care provider. The health care  provider may: ? Flush the ear with water. ? Use an instrument that has a loop on the end (curette). ? Use a suction device.  Surgery to remove the wax buildup. This may be done in severe cases. Follow these instructions at home:   Take over-the-counter and prescription medicines only as told by your health care provider.  Do not put any objects, including cotton swabs, into your ear. You can clean the opening of your ear canal with a washcloth or facial tissue.  Follow instructions from your health care provider about cleaning your ears. Do not over-clean your ears.  Drink enough fluid to keep your urine clear or pale yellow. This will help to thin the earwax.  Keep all follow-up visits as told by your health care provider. If earwax builds up in your ears often or if you use hearing aids, consider seeing your health care provider for routine, preventive ear cleanings. Ask your health care provider how often you should schedule your cleanings.  If you have hearing aids, clean them according to instructions from the manufacturer and your health care provider. Contact a health care provider if:  You have ear pain.  You develop a fever.  You have blood, pus, or other fluid coming from your ear.  You have hearing loss.  You have ringing in your ears that does not go away.  Your symptoms  do not improve with treatment.  You feel like the room is spinning (vertigo). Summary  Earwax can build up in the ear and cause discomfort or hearing loss.  The most common symptoms of this condition include reduced or muffled hearing and a feeling of fullness in the ear or feeling that the ear is plugged.  This condition may be diagnosed based on your symptoms, your medical history, and an ear exam.  This condition may be treated by using ear drops to soften the earwax or by having the earwax removed by a health care provider.  Do not put any objects, including cotton swabs, into your ear.  You can clean the opening of your ear canal with a washcloth or facial tissue. This information is not intended to replace advice given to you by your health care provider. Make sure you discuss any questions you have with your health care provider. Document Revised: 03/23/2017 Document Reviewed: 06/21/2016 Elsevier Patient Education  2020 [employer].

## 2019-09-30 NOTE — Assessment & Plan Note (Signed)
Lipid Panel     Component Value Date/Time   CHOL 211 (H) 03/29/2016 1000   TRIG 142 03/29/2016 1000   HDL 37 (L) 03/29/2016 1000   CHOLHDL 5.7 (H) 03/29/2016 1000   CHOLHDL 7.2 08/10/2014 1138   VLDL 27 08/10/2014 1138   LDLCALC 146 (H) 03/29/2016 1000   LABVLDL 28 03/29/2016 1000    Currently on high-intensity therapy w/ atorvastatin 80 mg daily. Denies any muscle aches or pain.  - C/w atorvastatin 80mg  daily

## 2019-09-30 NOTE — Assessment & Plan Note (Signed)
Large amount of ear wax noted on bilateral ears. Significant amount extracted manually with ear pick. Advised to not use hearing aid 24/7 to allow for cerumen drainage.  - Manually extracted

## 2019-09-30 NOTE — Patient Instructions (Addendum)
Please talk to your mental health doctor about the drugs that may be causing constipation.   Keep taking ground flax seeds or chia seeds daily 2 Tablespoons  Dates have more sugar than figs, figs have more sugar than prunes- prunes have the least amounts of sugar- all are okay to eat.   Good sources of protein- beans, nuts, eggs, baked fish, boiled shrimp.    Lupita Leash [phone]

## 2019-09-30 NOTE — Progress Notes (Signed)
CC: Ear pain  HPI: [name] is a 64 y.o. with PMH listed below presenting with complaint of ear pain. Please see problem based assessment and plan for further details.  Past Medical History:  Diagnosis Date  . Degenerative disc disease, cervical    L spine 10/09 showing DJD and cervical spurring but no specific foraminal narrowing. Has been followed by Laurena Spies, pain management physician in [place].  . Depression    f/b Dr. Francina Ames at [facility]  . GERD (gastroesophageal reflux disease)   . Herniated disc    L5-S1. s/p laminectomy in 1993 by Dr. Tonita Cong.  MRI June 2005 showing L4/5 disc bulge + mild facet arthropathy. F/b Dr. Marjean Donna for pain management. Wheel chair bound.  . Hyperlipidemia   . Hypertension   . Pyloric stenosis    s/p EGD with balloon dilatation on 06/23/2007 by Dr. Benson Norway  . Seborrheic dermatitis    of face, sees Dr. Josph Macho Luptom, dermatop cream TID  . Tobacco abuse    Did not tolerate Chantix (vomiting)  . Wears hearing aid    In r. ear, followed by ENT. 2/2 recurrent otitis   Review of Systems: Review of Systems  Constitutional: Negative for chills, fever and malaise/fatigue.  Eyes: Negative for blurred vision.  Respiratory: Negative for shortness of breath.   Cardiovascular: Negative for chest pain, palpitations and leg swelling.  Gastrointestinal: Negative for constipation, diarrhea, nausea and vomiting.  All other systems reviewed and are negative.   Physical Exam: Vitals:   09/30/19 1346  BP: (!) 142/71  Pulse: 70  Temp: 98.2 F (36.8 C)  TempSrc: Oral  SpO2: 99%  Weight: 160 lb 12.8 oz (72.9 kg)  Height: 5\' 4"  (1.626 m)   Physical Exam  Constitutional: She is oriented to person, place, and time. She appears well-developed and well-nourished. No distress.  HENT:  Mouth/Throat: Oropharynx is clear and moist. No oropharyngeal exudate.  Right Ear with edematous external ear canal with pallor, significant significant  cerumen burden, tympanic membrane intact, no discharge  Eyes: Conjunctivae are normal.  Cardiovascular: Normal rate, regular rhythm, normal heart sounds and intact distal pulses.  No murmur heard. Respiratory: Effort normal and breath sounds normal. She has no wheezes. She has no rales.  GI: Soft. Bowel sounds are normal. She exhibits no distension. There is no abdominal tenderness.  Musculoskeletal:        General: No edema. Normal range of motion.     Cervical back: Normal range of motion and neck supple.  Neurological: She is alert and oriented to person, place, and time.  Skin: Skin is warm and dry.   Assessment & Plan:   Hypertension BP Readings from Last 3 Encounters:  09/30/19 (!) 142/71  03/18/19 116/68  09/06/18 131/82   Above goal this visit. Currently on amlodipine. Agitated today due to acute ear pain. Denies any lightheadedness, dizzines.  - C/w amlodipine 10mg  daily - Re-check bp at next visit once acute illness resolves - BMP in anticipation of need to start additional anti-hypertensives  Excessive ear wax Large amount of ear wax noted on bilateral ears. Significant amount extracted manually with ear pick. Advised to not use hearing aid 24/7 to allow for cerumen drainage.  - Manually extracted  Hyperlipidemia Lipid Panel     Component Value Date/Time   CHOL 211 (H) 03/29/2016 1000   TRIG 142 03/29/2016 1000   HDL 37 (L) 03/29/2016 1000   CHOLHDL 5.7 (H) 03/29/2016 1000   CHOLHDL  7.2 08/10/2014 1138   VLDL 27 08/10/2014 1138   LDLCALC 146 (H) 03/29/2016 1000   LABVLDL 28 03/29/2016 1000    Currently on high-intensity therapy w/ atorvastatin 80 mg daily. Denies any muscle aches or pain.  - C/w atorvastatin 80mg  daily  Recurrent otitis externa of right ear Miranda Gaines is a 64 yo F w/ PMH of hearing loss, HTn, GERD, HLD presenting to [facility] w/ complaint of ear pain, itching and worsening hearing loss. She mentions that this has been ongoing for couple weeks.  Denies any fevers, chills, nausea, vomiting. Denies any tenderness of mastoid process or manipulation of pinna. She mentions she has had prior hx of similar otitis externa episodes and this feels similar to her prior episodes.  A/P Pt w/ chronic hearing aids presenting with ear itching. Left ear appear normal but right ear shows significant ear wax burden. On removal, noted to have significant edema surrounding the exterior canal. Tympanic membrane was difficult to visualize but shows no bulging, drainage or erythema. Will treat as exacerbation of recurrent otitis externa.  - Ciprofloxacin BID for 7 days    Patient discussed with [facility]. [facility]   -Miranda Gaines, PGY2 [facility] Internal Medicine Pager: [phone]

## 2019-09-30 NOTE — Assessment & Plan Note (Signed)
Miranda Gaines is a 64 yo F w/ PMH of hearing loss, HTn, GERD, HLD presenting to [facility] w/ complaint of ear pain, itching and worsening hearing loss. She mentions that this has been ongoing for couple weeks. Denies any fevers, chills, nausea, vomiting. Denies any tenderness of mastoid process or manipulation of pinna. She mentions she has had prior hx of similar otitis externa episodes and this feels similar to her prior episodes.  A/P Pt w/ chronic hearing aids presenting with ear itching. Left ear appear normal but right ear shows significant ear wax burden. On removal, noted to have significant edema surrounding the exterior canal. Tympanic membrane was difficult to visualize but shows no bulging, drainage or erythema. Will treat as exacerbation of recurrent otitis externa.  - Ciprofloxacin BID for 7 days

## 2019-09-30 NOTE — Progress Notes (Signed)
Documentation:  Start time:1315 End time: 1335  Miranda Gaines was seen today for a complaint of constipation. Her records show she is  having 2-3 soft bowel movement per week for the first 3 weeks, but started with constipation the past week after running out of her fiber supplement. She was provided again with ground flax seeds 2 tablespoons a day as a fiber supplement.  Her diet recall today included less saturated fat and processed foods and more whole grains, fruits vegetable and lean protein as wll as adequate water. . She reports daily exercises as able, was not able to get transptoation to the [facility]. She sleeps ~ 12 hours a day and thinks this is due to boredom.   Plan is for her to restart her fiber supplement. Follow up as needed. Consider alternatives to medication that can cause constipation like nortriptyline, flexeril and trazadone Norm Parcel, RD 09/30/2019 1:06 PM.

## 2019-09-30 NOTE — Assessment & Plan Note (Addendum)
BP Readings from Last 3 Encounters:  09/30/19 (!) 142/71  03/18/19 116/68  09/06/18 131/82   Above goal this visit. Currently on amlodipine. Agitated today due to acute ear pain. Denies any lightheadedness, dizzines.  - C/w amlodipine 10mg  daily - Re-check bp at next visit once acute illness resolves - BMP in anticipation of need to start additional anti-hypertensives

## 2019-10-01 ENCOUNTER — Encounter: Payer: Self-pay | Admitting: Internal Medicine

## 2019-10-01 LAB — BMP8+ANION GAP
Anion Gap: 15 mmol/L (ref 10.0–18.0)
BUN/Creatinine Ratio: 11 — ABNORMAL LOW (ref 12–28)
BUN: 10 mg/dL (ref 8–27)
CO2: 22 mmol/L (ref 20–29)
Calcium: 9.8 mg/dL (ref 8.7–10.3)
Chloride: 105 mmol/L (ref 96–106)
Creatinine, Ser: 0.88 mg/dL (ref 0.57–1.00)
GFR calc Af Amer: 80 mL/min/{1.73_m2} (ref >59)
GFR calc non Af Amer: 70 mL/min/{1.73_m2} (ref >59)
Glucose: 75 mg/dL (ref 65–99)
Potassium: 4.3 mmol/L (ref 3.5–5.2)
Sodium: 142 mmol/L (ref 134–144)

## 2019-10-01 NOTE — Progress Notes (Signed)
Internal Medicine Clinic Attending  Case discussed with Dr. Nedra Hai at the time of the visit.  We reviewed the resident's history and exam and pertinent patient test results.  I agree with the assessment, diagnosis, and plan of care documented in the resident's note.   To clarify - patient was prescribed topical cipro drops, not oral.

## 2019-10-04 ENCOUNTER — Telehealth: Payer: Self-pay | Admitting: Internal Medicine

## 2019-10-04 NOTE — Telephone Encounter (Signed)
Discussed with Miranda Gaines her lab results. All other questions and concerns addressed.

## 2019-10-06 ENCOUNTER — Telehealth: Payer: Self-pay | Admitting: *Deleted

## 2019-10-06 ENCOUNTER — Ambulatory Visit: Payer: Medicare Other | Attending: Internal Medicine

## 2019-10-06 DIAGNOSIS — Z23 Encounter for immunization: Secondary | ICD-10-CM

## 2019-10-06 NOTE — Progress Notes (Signed)
   Covid-19 Vaccination Clinic  Name:  FALYNN AILEY    MRN: [id number] DOB: 06-12-1955  10/06/2019  Ms. Gavilanes was observed post Covid-19 immunization for 15 minutes without incident. She was provided with Vaccine Information Sheet and instruction to access the V-Safe system.   Ms. Rispoli was instructed to call 911 with any severe reactions post vaccine: Marland Kitchen Difficulty breathing  . Swelling of face and throat  . A fast heartbeat  . A bad rash all over body  . Dizziness and weakness   Immunizations Administered    Name Date Dose VIS Date Route   Pfizer COVID-19 Vaccine 10/06/2019  8:11 AM 0.3 mL 06/18/2018 Intramuscular   Manufacturer: [employer], [employer]   Lot: [id number]   NDC: [address]-2

## 2019-10-06 NOTE — Telephone Encounter (Signed)
Received Detailed Product Description for power w/c repairs. Placed in PCP's box for signature. Kinnie Feil, BSN, RN-BC

## 2019-10-08 ENCOUNTER — Other Ambulatory Visit: Payer: Self-pay | Admitting: Internal Medicine

## 2019-10-08 DIAGNOSIS — H6091 Unspecified otitis externa, right ear: Secondary | ICD-10-CM

## 2019-10-08 DIAGNOSIS — M5137 Other intervertebral disc degeneration, lumbosacral region: Secondary | ICD-10-CM | POA: Diagnosis not present

## 2019-10-08 DIAGNOSIS — M5416 Radiculopathy, lumbar region: Secondary | ICD-10-CM | POA: Diagnosis not present

## 2019-10-08 DIAGNOSIS — M961 Postlaminectomy syndrome, not elsewhere classified: Secondary | ICD-10-CM | POA: Diagnosis not present

## 2019-10-22 ENCOUNTER — Other Ambulatory Visit: Payer: Self-pay | Admitting: Internal Medicine

## 2019-11-21 NOTE — Telephone Encounter (Signed)
This was signed on 10/21/2019 and faxed to [employer]. Docs in [employer]. Kinnie Feil, BSN, RN-BC

## 2019-11-25 ENCOUNTER — Other Ambulatory Visit: Payer: Self-pay | Admitting: *Deleted

## 2019-11-25 DIAGNOSIS — K219 Gastro-esophageal reflux disease without esophagitis: Secondary | ICD-10-CM

## 2019-11-25 DIAGNOSIS — E785 Hyperlipidemia, unspecified: Secondary | ICD-10-CM

## 2019-11-25 MED ORDER — ATORVASTATIN CALCIUM 80 MG PO TABS: ORAL_TABLET | ORAL | 3 refills | Status: DC

## 2019-11-25 MED ORDER — AMLODIPINE BESYLATE 10 MG PO TABS: 10.0000 mg | ORAL_TABLET | Freq: Every day | ORAL | 3 refills | Status: DC

## 2019-11-25 MED ORDER — PANTOPRAZOLE SODIUM 20 MG PO TBEC: 20.0000 mg | DELAYED_RELEASE_TABLET | Freq: Every day | ORAL | 11 refills | Status: DC

## 2019-11-25 NOTE — Telephone Encounter (Signed)
Called pt - state she wants refills sent to [facility] pharmacy. Also Trazadone  Needs to be refilled Thanks.

## 2019-11-27 ENCOUNTER — Telehealth: Payer: Self-pay | Admitting: *Deleted

## 2019-11-27 NOTE — Telephone Encounter (Signed)
Trazodone is prescribed by her mental health provider. Will defer refill to them.

## 2019-11-27 NOTE — Telephone Encounter (Signed)
Requesting a refill on Trazodone. Thanks

## 2019-12-01 DIAGNOSIS — F5101 Primary insomnia: Secondary | ICD-10-CM | POA: Diagnosis not present

## 2019-12-01 DIAGNOSIS — F332 Major depressive disorder, recurrent severe without psychotic features: Secondary | ICD-10-CM | POA: Diagnosis not present

## 2019-12-16 ENCOUNTER — Other Ambulatory Visit: Payer: Self-pay | Admitting: Internal Medicine

## 2020-01-06 ENCOUNTER — Ambulatory Visit (INDEPENDENT_AMBULATORY_CARE_PROVIDER_SITE_OTHER): Payer: Medicare Other

## 2020-01-06 ENCOUNTER — Other Ambulatory Visit: Payer: Self-pay

## 2020-01-06 DIAGNOSIS — Z23 Encounter for immunization: Secondary | ICD-10-CM | POA: Diagnosis not present

## 2020-03-05 ENCOUNTER — Other Ambulatory Visit: Payer: Self-pay | Admitting: Internal Medicine

## 2020-03-05 DIAGNOSIS — Z1231 Encounter for screening mammogram for malignant neoplasm of breast: Secondary | ICD-10-CM

## 2020-04-15 ENCOUNTER — Ambulatory Visit: Payer: Medicare Other

## 2020-05-05 DIAGNOSIS — F5101 Primary insomnia: Secondary | ICD-10-CM | POA: Diagnosis not present

## 2020-05-19 ENCOUNTER — Ambulatory Visit
Admission: RE | Admit: 2020-05-19 | Discharge: 2020-05-19 | Disposition: A | Discharge status: Home / Self Care | Payer: Medicare Other | Source: Ambulatory Visit | Attending: Internal Medicine | Admitting: Internal Medicine

## 2020-05-19 ENCOUNTER — Other Ambulatory Visit: Payer: Self-pay

## 2020-05-19 DIAGNOSIS — Z1231 Encounter for screening mammogram for malignant neoplasm of breast: Secondary | ICD-10-CM | POA: Diagnosis not present

## 2020-07-19 DIAGNOSIS — H905 Unspecified sensorineural hearing loss: Secondary | ICD-10-CM | POA: Diagnosis not present

## 2020-07-27 DIAGNOSIS — F5101 Primary insomnia: Secondary | ICD-10-CM | POA: Diagnosis not present

## 2020-08-12 ENCOUNTER — Ambulatory Visit: Payer: Self-pay | Admitting: *Deleted

## 2020-08-12 ENCOUNTER — Telehealth: Payer: Self-pay

## 2020-08-12 NOTE — Telephone Encounter (Signed)
Requesting information regarding Covid Booster #1. Patient has cold sxs at this time-provided information including call to schedule your Covid Booster 10 days after all your cold symptoms have resolved.  Patient stated understanding information provided.  Reason for Disposition . Health Information question, no triage required and triager able to answer question  Answer Assessment - Initial Assessment Questions 1. REASON FOR CALL or QUESTION: "What is your reason for calling today?" or "How can I best help you?" or "What question do you have that I can help answer?"     Booster questions  Protocols used: INFORMATION ONLY CALL - NO TRIAGE-A-AH

## 2020-08-12 NOTE — Telephone Encounter (Signed)
Return pt's call - stated she wants to get the booster but has a cold. I asked about her symptoms; stated she has a cough, taking Theraflu, drinking fluids. Denies any other covid symptoms. Informed once she's over her cold, she can schedule the booster; info for sites [phone]) given to pt to call.

## 2020-08-12 NOTE — Telephone Encounter (Signed)
Agree with recommendation. Thank you

## 2020-08-12 NOTE — Telephone Encounter (Signed)
Pt is requesting a call back she is wanting to ask some questions about the  covid booster shot

## 2020-09-11 ENCOUNTER — Encounter: Payer: Self-pay | Admitting: *Deleted

## 2020-09-16 ENCOUNTER — Other Ambulatory Visit: Payer: Self-pay | Admitting: Internal Medicine

## 2020-09-16 DIAGNOSIS — E785 Hyperlipidemia, unspecified: Secondary | ICD-10-CM

## 2020-09-16 DIAGNOSIS — K219 Gastro-esophageal reflux disease without esophagitis: Secondary | ICD-10-CM

## 2020-09-17 NOTE — Telephone Encounter (Signed)
Last visit 09/30/2019 Message sent to front office to assist with an appt

## 2020-09-21 NOTE — Telephone Encounter (Signed)
Appt sch for 10/28/2020 with Dr. Mayford Knife for 10:15 am.

## 2020-10-11 ENCOUNTER — Other Ambulatory Visit: Payer: Self-pay | Admitting: Internal Medicine

## 2020-10-11 DIAGNOSIS — E785 Hyperlipidemia, unspecified: Secondary | ICD-10-CM

## 2020-10-11 DIAGNOSIS — B9789 Other viral agents as the cause of diseases classified elsewhere: Secondary | ICD-10-CM

## 2020-10-19 DIAGNOSIS — F5101 Primary insomnia: Secondary | ICD-10-CM | POA: Diagnosis not present

## 2020-10-28 ENCOUNTER — Encounter: Payer: Medicare Other | Admitting: Internal Medicine

## 2020-11-05 ENCOUNTER — Encounter: Payer: Self-pay | Admitting: *Deleted

## 2020-11-05 NOTE — Progress Notes (Unsigned)

## 2020-11-24 ENCOUNTER — Telehealth: Payer: Self-pay | Admitting: *Deleted

## 2020-11-24 NOTE — Telephone Encounter (Signed)
Patient called in stating she needs a new charger for her power w/c. Call placed to Lenard Forth, Engineer, maintenance for [employer] She will fax over an order for power w/c batteries/charger. States patient will not need an OV. She will contact patient today for more information.

## 2020-11-26 NOTE — Telephone Encounter (Signed)
CMN for power w/c charger/battery signed by Attending Mikey Bussing) and faxed to Fredderick Erb, [employer] [facility] Specialist. Fax confirmation receipt received.

## 2020-11-30 DIAGNOSIS — M47892 Other spondylosis, cervical region: Secondary | ICD-10-CM | POA: Diagnosis not present

## 2020-11-30 DIAGNOSIS — E785 Hyperlipidemia, unspecified: Secondary | ICD-10-CM | POA: Diagnosis not present

## 2020-11-30 DIAGNOSIS — R269 Unspecified abnormalities of gait and mobility: Secondary | ICD-10-CM | POA: Diagnosis not present

## 2020-11-30 DIAGNOSIS — R1310 Dysphagia, unspecified: Secondary | ICD-10-CM | POA: Diagnosis not present

## 2020-12-16 ENCOUNTER — Other Ambulatory Visit: Payer: Self-pay

## 2020-12-16 ENCOUNTER — Ambulatory Visit (INDEPENDENT_AMBULATORY_CARE_PROVIDER_SITE_OTHER): Payer: Medicare Other | Admitting: Internal Medicine

## 2020-12-16 ENCOUNTER — Other Ambulatory Visit: Payer: Self-pay | Admitting: *Deleted

## 2020-12-16 VITALS — BP 157/90 | HR 65 | Temp 98.2°F | Ht 64.0 in

## 2020-12-16 DIAGNOSIS — Z789 Other specified health status: Secondary | ICD-10-CM | POA: Diagnosis not present

## 2020-12-16 DIAGNOSIS — K5900 Constipation, unspecified: Secondary | ICD-10-CM

## 2020-12-16 DIAGNOSIS — M545 Low back pain, unspecified: Secondary | ICD-10-CM | POA: Diagnosis not present

## 2020-12-16 DIAGNOSIS — M79601 Pain in right arm: Secondary | ICD-10-CM

## 2020-12-16 DIAGNOSIS — Z7409 Other reduced mobility: Secondary | ICD-10-CM | POA: Diagnosis not present

## 2020-12-16 DIAGNOSIS — E785 Hyperlipidemia, unspecified: Secondary | ICD-10-CM

## 2020-12-16 DIAGNOSIS — J329 Chronic sinusitis, unspecified: Secondary | ICD-10-CM

## 2020-12-16 DIAGNOSIS — R21 Rash and other nonspecific skin eruption: Secondary | ICD-10-CM | POA: Diagnosis not present

## 2020-12-16 DIAGNOSIS — E669 Obesity, unspecified: Secondary | ICD-10-CM

## 2020-12-16 DIAGNOSIS — G8929 Other chronic pain: Secondary | ICD-10-CM

## 2020-12-16 DIAGNOSIS — K219 Gastro-esophageal reflux disease without esophagitis: Secondary | ICD-10-CM

## 2020-12-16 DIAGNOSIS — L309 Dermatitis, unspecified: Secondary | ICD-10-CM

## 2020-12-16 DIAGNOSIS — F32A Depression, unspecified: Secondary | ICD-10-CM | POA: Diagnosis not present

## 2020-12-16 DIAGNOSIS — I10 Essential (primary) hypertension: Secondary | ICD-10-CM | POA: Diagnosis not present

## 2020-12-16 MED ORDER — AMLODIPINE BESYLATE 10 MG PO TABS: 10.0000 mg | ORAL_TABLET | Freq: Every day | ORAL | 3 refills | Status: DC

## 2020-12-16 NOTE — Assessment & Plan Note (Signed)
Feels like echo in her head.  Do my ears need to be flusyed out?

## 2020-12-16 NOTE — Assessment & Plan Note (Addendum)
nortryptiline for sleep, stress, depression.  Likely cause of her constipation.  I need to speak with her mental health doctor for alternatives.  SHe is interested in talk therapy to help address sadness and anxiety associated with poor health of a sister and death of another.  Will refer to [facility].

## 2020-12-16 NOTE — Assessment & Plan Note (Addendum)
Exacerbated by nortriptyline (she would like recommendations for an alternative).  Manged with prunes, prune juice, and raisins.  Fruits and salads.

## 2020-12-16 NOTE — Assessment & Plan Note (Signed)
Was on topical steroids for years, began changing her skin, stopped the cream,  Hasn't been on medicine since then.

## 2020-12-16 NOTE — Progress Notes (Signed)
"Meet PCP" visit with me today.  Miranda Gaines has unfortunately not been able to attend the clinic for some time, as she was needing a replacement battery for her motorized wheelchair.  Finally this was obtained, and she has been able to leave her house via Medicaid transportation and scabbed.  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  Independent in transfers to/from her [facility].  She has had no falls and manages her ADLs and IADLs independently.  She has a shower bench.  Sister Miranda Gaines is her person/support, lives in [place].  She does have chronic low back pain which has been managed with cyclobenzaprine through a pain specialist.  Missed appt with Marzetta Board, PA, pain management through [facility] in [place], location changed, and missed appt, needs to reschedule.  In general she reports that she has been doing very well.  She wants me to be aware of R elbow/arm pain, which began before resuming use of WC (this chair is controlled by a toggle for her right hand.)  It is chronic, worse with movement, and she feels may be involving some mild swelling though she has not noted redness nor increased heat in the area.  As the visit progressed, she wished to discuss her concerns about health problems and family members.  A younger sister has recently experienced a stroke stroke which complicated repair of a symptomatic brain aneyrysm.  Her sister is currently in rehab, unable to speak normally, which is really been negatively impacting her emotionally.  Also lost an older sister.  Not sleeping, not eating, frequently crying.  Taking mental health medicine but still worrying.  She is interested in talk therapy.  Doesn't want to use "TV" visit, wants a face to face visit.       Needs a medicine for face, which she describes as dry, breaks out in red bumps on midface and upper cheeks.  She thinks this coincides with taking  a laxative or eating a starchy food.  "When I take a laxative or eat the wrong thing my face breaks out".  She uses vaseline. Face is sensitive.  She recalls dx of eczema in the past.  Troubled by constipation which she feels is due to her mental health medicines (she is on nortriptyline) and wonders if there is an alternative.  BP (!) 157/90   Pulse 65   Temp 98.2 F (36.8 C) (Oral)   Ht 5\' 4"  (1.626 m)   SpO2 100%   BMI 27.60 kg/m   NIcely dressed and groomed woman sitting in well-maintained EWC.  She is pleasant, conversant, has some difficulty with descriptions of her problems and health-related language due level of literacy.  Hearing aides in each ear.  TMs very retracted and scarred? Scant cerumen.  Facial skin is smooth and moisturized, no dryness or flaking, no current lesions over nose or cheeks.  Eyelid skin and hairline are normal.  R arm/elbow:  She points to area of anterior proximal forearm between antecubital space and olecranon as area of concern.  Tender in this area.  No swelling or warmth compared to L side.  Pain elicited mostly by external rotation and extension of elbow.  Olecranon itself is normal.  No pain in antecubital space.  No phlebitis findings.  Distal wrist, hand, radial pulses are normal.    Needs SCAT renewal. She will bring papers for authorization.  Overdue for a number of health  maintenance interventions, including TDaP, Zoster vaccine, PAP, DEXA, pneumonia vax, Covid booster.  UTD on mammogram.  Last colonoscopy 2014.   Assessment and plan (see problem based documentation)  Priorities are f /u lipid levels, control BP, research former evals of facial rash (looks like dx of eczema but no exams document the lesions) and include rosacea in differential due to her description of red bumps, recommend topical NSAID for R elbow/arm pain   (dx presumptively as lateral epicondylitis, though no repetitive use triggers that I can identify).  Refer to [facility] for therapy.   Communicate with her mental health prescriber to suggest alternative to anticholinergic nortriptyline.

## 2020-12-16 NOTE — Patient Instructions (Signed)
Ms. Mondor, I enjoyed meeting you today and look forward to working together on your health care goals.  Today we discussed how your sister's brain aneurysm and stroke have been causing much sadness and worry, and you are interested in speaking with our therapist, Dr. Monna Fam.  I will make a referral.  You will enjoy speaking with her.  We talked about your right arm soreness.  You seem to have tendonitis, which is inflammation of a tendon, the part of your body that connects a bone to a muscle.  An anti-inflammatory medicine should help.  You told me about your face skin concerns.  I will investigate what has worked for you in the past and get something prescribed.  Your nortriptyline is causing your constipation.  There are different medicines which don't have this side effect.  I will try to reach out to your mental health doctor to discuss alternatives.  Your blood pressure is running high, and we will have to consider adjusting your medication.  Before we do that, let's check your blood work.  I will call you with your test results and plans for new prescription.  Take care and stay well!  Dr. Mayford Knife

## 2020-12-17 DIAGNOSIS — M79601 Pain in right arm: Secondary | ICD-10-CM | POA: Insufficient documentation

## 2020-12-17 DIAGNOSIS — R21 Rash and other nonspecific skin eruption: Secondary | ICD-10-CM | POA: Insufficient documentation

## 2020-12-17 LAB — BMP8+ANION GAP
Anion Gap: 16 mmol/L (ref 10.0–18.0)
BUN/Creatinine Ratio: 10 — ABNORMAL LOW (ref 12–28)
BUN: 10 mg/dL (ref 8–27)
CO2: 21 mmol/L (ref 20–29)
Calcium: 9.9 mg/dL (ref 8.7–10.3)
Chloride: 103 mmol/L (ref 96–106)
Creatinine, Ser: 0.97 mg/dL (ref 0.57–1.00)
Glucose: 81 mg/dL (ref 65–99)
Potassium: 4.3 mmol/L (ref 3.5–5.2)
Sodium: 140 mmol/L (ref 134–144)
eGFR: 65 mL/min/{1.73_m2} (ref >59)

## 2020-12-17 LAB — LIPID PANEL
Chol/HDL Ratio: 6.4 ratio — ABNORMAL HIGH (ref 0.0–4.4)
Cholesterol, Total: 245 mg/dL — ABNORMAL HIGH (ref 100–199)
HDL: 38 mg/dL — ABNORMAL LOW (ref >39)
LDL Chol Calc (NIH): 178 mg/dL — ABNORMAL HIGH (ref 0–99)
Triglycerides: 156 mg/dL — ABNORMAL HIGH (ref 0–149)
VLDL Cholesterol Cal: 29 mg/dL (ref 5–40)

## 2020-12-17 MED ORDER — PANTOPRAZOLE SODIUM 20 MG PO TBEC: 20.0000 mg | DELAYED_RELEASE_TABLET | Freq: Every day | ORAL | 0 refills | Status: DC

## 2020-12-17 MED ORDER — PANTOPRAZOLE SODIUM 20 MG PO TBEC: 20.0000 mg | DELAYED_RELEASE_TABLET | Freq: Every day | ORAL | 3 refills | Status: DC

## 2020-12-17 MED ORDER — DICLOFENAC SODIUM 1.6 % EX GEL: 2.0000 g | Freq: Four times a day (QID) | CUTANEOUS | 0 refills | Status: DC | PRN

## 2020-12-17 MED ORDER — EZETIMIBE 10 MG PO TABS: 10.0000 mg | ORAL_TABLET | Freq: Every day | ORAL | 3 refills | Status: DC

## 2020-12-17 MED ORDER — LISINOPRIL 10 MG PO TABS: 20.0000 mg | ORAL_TABLET | Freq: Every day | ORAL | 3 refills | Status: DC

## 2020-12-17 NOTE — Assessment & Plan Note (Signed)
Exam and nature of pain suggests lateral epicondylitis though no repetitive motion history.  Topical NSAID.

## 2020-12-17 NOTE — Assessment & Plan Note (Signed)
Uncontrolled historically.  Add lisinopril 10 mg to amlodipine 10 mg daily.  Renal fxn nml.

## 2020-12-17 NOTE — Assessment & Plan Note (Signed)
Canals patent this visit, scant cerumen. Monitor.

## 2020-12-17 NOTE — Assessment & Plan Note (Signed)
LDL above goal for primary prevention on max atorvastatin.  Endorses adherence.  Add ezetimibe.

## 2020-12-17 NOTE — Assessment & Plan Note (Signed)
Replacement battery finally provided and she is now able to leave the house and safely move about her home.  No falls,  Does well with transfering in/out of chair.  Independent in all ADLs.

## 2020-12-17 NOTE — Assessment & Plan Note (Signed)
Symptoms controlled with daily PPI.  In future will readdress necessity and consider change to H2B (inadequate time today)

## 2020-12-17 NOTE — Assessment & Plan Note (Signed)
Managed through a pain specialist.  Missed appt with Wilford Corner, PA, pain management through [facility] in [place], location changed, and missed appt, needs to reschedule.

## 2020-12-17 NOTE — Assessment & Plan Note (Signed)
Reported recurrences of red sensitive bumps over nose and cheeks.  SHe recalls a dx of eczema.  No lesions today.  No photos or descriptions of rash in record.  Rosacea is on differential.  See entry under eczema.

## 2020-12-17 NOTE — Assessment & Plan Note (Signed)
Pt could not safely stand on scale today - weight not obtained.

## 2020-12-20 ENCOUNTER — Other Ambulatory Visit: Payer: Self-pay | Admitting: Internal Medicine

## 2020-12-20 DIAGNOSIS — H6123 Impacted cerumen, bilateral: Secondary | ICD-10-CM

## 2020-12-22 ENCOUNTER — Other Ambulatory Visit: Payer: Self-pay | Admitting: Internal Medicine

## 2020-12-23 NOTE — Telephone Encounter (Signed)
Patient returned call. States she's using [facility] not [facility]. [facility] removed from med list.  Patient requesting lab results from 8/25. Explained PCP will call her upon her return next week.

## 2020-12-23 NOTE — Telephone Encounter (Signed)
#  90 w/3 refills  sent to  [facility] [place], [place] - [address] AT [address] Phone:  [phone]  Fax:  [phone]     On 12/16/20.. called placed to PT  to find out which pharmacy she is using but not answer left message for PT to call office back .

## 2020-12-30 ENCOUNTER — Telehealth: Payer: Self-pay | Admitting: *Deleted

## 2020-12-30 NOTE — Telephone Encounter (Signed)
Patient calling back for lab results drawn 8/25. Also, states PCP was going to send a medication to [facility] for her face. She has not yet received diclofenac gel from [facility]. She will call them now.

## 2020-12-31 ENCOUNTER — Other Ambulatory Visit: Payer: Self-pay | Admitting: Internal Medicine

## 2020-12-31 NOTE — Progress Notes (Signed)
Spoke to Miranda Gaines yesterday who reported she hadn't received the voltaren gel or any of her other meds from [facility].  She also was requesting something for her facial eczema.  I called [facility] today and rep said that nothing had been mailed yet, that they were waiting for patient's usual batched order to be mailed.  I requested the voltaren be sent now.  SHe stated that she would speak to their management with reminder to immediately mail time sensitive meds such as symptom relieving, antibiotics, etc.    I don't have a good solution for Miranda Gaines's face.  She had no rash at time of visit, wishes to avoid topical steroids as they have caused skin changes in the past.  She reports that her face breaks out with exposure to strong odors such as cleaning supplies, cutting onions.  This is similar to historical documents dating back a few years in chart.  She had no relief when taking antihistamine for seasonal allergies.  I advised emollient such as Aquaphor.  She was frustrated and said she'd continue to use vaseline.

## 2021-01-03 DIAGNOSIS — M5416 Radiculopathy, lumbar region: Secondary | ICD-10-CM | POA: Diagnosis not present

## 2021-01-03 DIAGNOSIS — M5137 Other intervertebral disc degeneration, lumbosacral region: Secondary | ICD-10-CM | POA: Diagnosis not present

## 2021-01-03 DIAGNOSIS — M961 Postlaminectomy syndrome, not elsewhere classified: Secondary | ICD-10-CM | POA: Diagnosis not present

## 2021-01-12 DIAGNOSIS — F5101 Primary insomnia: Secondary | ICD-10-CM | POA: Diagnosis not present

## 2021-01-19 ENCOUNTER — Ambulatory Visit: Payer: Medicare Other | Admitting: Behavioral Health

## 2021-01-19 DIAGNOSIS — F4321 Adjustment disorder with depressed mood: Secondary | ICD-10-CM

## 2021-01-19 DIAGNOSIS — F331 Major depressive disorder, recurrent, moderate: Secondary | ICD-10-CM

## 2021-01-19 DIAGNOSIS — F419 Anxiety disorder, unspecified: Secondary | ICD-10-CM

## 2021-01-19 NOTE — BH Specialist Note (Signed)
Integrated Behavioral Health via Telemedicine Visit  01/19/2021 Miranda Gaines [id number]  Number of Integrated Behavioral Health visits: 1/6 Session Start time: 9:30am  Session End time: 10:00am Total time: 30  Referring Provider: Dr. Charissa Bash, MD Patient/Family location: Pt @ home in private [facility] Provider location: Working remotely in private All persons participating in visit: Pt & Clinician Types of Service: Health Promotion and Introduction only  I connected with Miranda Gaines and/or Miranda Gaines's  self  via  Telephone or Engineer, civil (consulting)  (Video is Surveyor, mining) and verified that I am speaking with the correct person using two identifiers. Discussed confidentiality: Yes   I discussed the limitations of telemedicine and the availability of in person appointments.  Discussed there is a possibility of technology failure and discussed alternative modes of communication if that failure occurs.  I discussed that engaging in this telemedicine visit, they consent to the provision of behavioral healthcare and the services will be billed under their insurance.  Patient and/or legal guardian expressed understanding and consented to Telemedicine visit: Yes   Presenting Concerns: Patient and/or family reports the following symptoms/concerns: Pt has elevated anx/dep & grieving sadness due to recent death of 66yo Str & severe stroke of Baby Str who stayed in [facility] for one mos due to stroke event post surgical aneurysm repair. Pt has exp'd the death of her Great Nephew 2 wks ago by GSW. Her Str's Son's Son was the victim. She has since tried to console her Nephew, but he is, "angry w/the World". Pt will try to reconnect in another few wks bc she understands the origin of his anger.   Duration of problem: since June 2022 Pt has exp'd multiple F-related deaths & health issues as her Pecola Leisure Str has suffered a stroke; Severity of problem: moderate  Patient  and/or Family's Strengths/Protective Factors: Social connections, Social and Patent attorney, Concrete supports in place (healthy food, safe environments, etc.), Sense of purpose, and Pt resilience due to her strong faith beliefs  Goals Addressed: Patient will:  Reduce symptoms of: anxiety, depression, stress, and strong grief rxn due to multiple unexpected deaths in her Family    Increase knowledge and/or ability of: coping skills, stress reduction, and healthy response to grief rxn (provide psychoedu about death & dying, end-of-life issues    Demonstrate ability to: Increase healthy adjustment to current life circumstances and Begin healthy grieving over loss  Progress towards Goals: Estb'd today; Pt will attend psychotherapy sessions until remission of grief & coping skills are inc'd to Pt expression of dec'd anx/dep & grief or to Pt tolerance for calls!  Interventions: Interventions utilized:  Supportive Counseling Standardized Assessments completed:  screeners prn  Patient and/or Family Response: Pt receptive to call & expressed preference for telehealth calls due to the frsutration w/arranging transportation if she does not have to do so. Pt requests future calls for mental health wellness.  Assessment: Patient currently experiencing elevated anx/dep & grief. Pt related the recent deaths in her Family, esp'ly distraught for [employer] who was killed suddenly by GSW. He was 66yo.   Pt has strong faith beliefs that are keeping her steady. Pt has strong support syst in her Family.  Patient may benefit from cont'd sessions to address grief & Family health status changes, as well as her own.  Plan: Follow up with behavioral health clinician on : 2-3 wks for 30 min on telehealth Behavioral recommendations: None today Referral(s): Integrated [employer] (In Clinic)  I  discussed the assessment and treatment plan with the patient and/or parent/guardian. They were  provided an opportunity to ask questions and all were answered. They agreed with the plan and demonstrated an understanding of the instructions.   They were advised to call back or seek an in-person evaluation if the symptoms worsen or if the condition fails to improve as anticipated.  Deneise Lever, LMFT

## 2021-02-01 ENCOUNTER — Ambulatory Visit (INDEPENDENT_AMBULATORY_CARE_PROVIDER_SITE_OTHER): Payer: Medicare Other | Admitting: *Deleted

## 2021-02-01 DIAGNOSIS — Z23 Encounter for immunization: Secondary | ICD-10-CM

## 2021-02-08 ENCOUNTER — Ambulatory Visit: Payer: Medicare Other | Admitting: Behavioral Health

## 2021-02-08 DIAGNOSIS — F419 Anxiety disorder, unspecified: Secondary | ICD-10-CM

## 2021-02-08 DIAGNOSIS — F331 Major depressive disorder, recurrent, moderate: Secondary | ICD-10-CM

## 2021-02-08 NOTE — BH Specialist Note (Signed)
Integrated Behavioral Health via Telemedicine Visit  02/08/2021 Miranda Gaines [id number]  Number of Integrated Behavioral Health visits: 2/6 Session Start time: 10:30am  Session End time: 11:00am Total time: 30  Referring Provider: Dr. Charissa Bash, MD Patient/Family location: Pt is home in private [facility] Provider location: [facility] Office All persons participating in visit: Pt & Clinician Types of Service: Individual psychotherapy  I connected with Miranda Gaines and/or Miranda Gaines's  self  via  Telephone or Engineer, civil (consulting)  (Video is Surveyor, mining) and verified that I am speaking with the correct person using two identifiers. Discussed confidentiality:  2nd visit  I discussed the limitations of telemedicine and the availability of in person appointments.  Discussed there is a possibility of technology failure and discussed alternative modes of communication if that failure occurs.  I discussed that engaging in this telemedicine visit, they consent to the provision of behavioral healthcare and the services will be billed under their insurance.  Patient and/or legal guardian expressed understanding and consented to Telemedicine visit:  2nd visit  Presenting Concerns: Patient and/or family reports the following symptoms/concerns: reduced anx/dep due to resolution of her Nephew's Son's murder in June. Her Str is also healing well from her aneurysms Duration of problem: months; Severity of problem: mild  Patient and/or Family's Strengths/Protective Factors: Social connections, Social and Emotional competence, Concrete supports in place (healthy food, safe environments, etc.), and Sense of purpose  Goals Addressed: Patient will:  Reduce symptoms of: anxiety and depression   Increase knowledge and/or ability of: coping skills   Demonstrate ability to: Increase healthy adjustment to current life circumstances  Progress towards  Goals: Ongoing  Interventions: Interventions utilized:  Mindfulness or Management consultant, Supportive Counseling, and spiritual resources from her faith Standardized Assessments completed:  screeners prn  Patient and/or Family Response: Pt receptive to call today & requests   Assessment: Patient currently experiencing reduction in anxiety over Nephew's Son who was recently killed; the Police have the [employer]. Pt is able to focus on her grief & her support for her Nephew; he is speaking less angrily to her now.  Pt still worries for her Str who is having her second aneurysm repair surgery soon-she wants her to regain as much function as possible.  Pt is worried less in general. Worry makes her, "want to sleep to get away from it all." Pt is grateful to wake up every day. She has a strong faith & relies on this.  Patient may benefit from cont'd support & encouragement from an objective listener who is not family/friend.  Plan: Follow up with behavioral health clinician on : 2-3 wks for 30 min ck-in Behavioral recommendations: Watch for my note to come in the mail! Read & write to keep yourself happy. Referral(s): Integrated [employer] (In Clinic)  I discussed the assessment and treatment plan with the patient and/or parent/guardian. They were provided an opportunity to ask questions and all were answered. They agreed with the plan and demonstrated an understanding of the instructions.   They were advised to call back or seek an in-person evaluation if the symptoms worsen or if the condition fails to improve as anticipated.  Miranda Lever, LMFT

## 2021-03-10 ENCOUNTER — Other Ambulatory Visit: Payer: Self-pay

## 2021-03-10 DIAGNOSIS — K219 Gastro-esophageal reflux disease without esophagitis: Secondary | ICD-10-CM

## 2021-03-10 MED ORDER — PANTOPRAZOLE SODIUM 20 MG PO TBEC: 20.0000 mg | DELAYED_RELEASE_TABLET | Freq: Every day | ORAL | 3 refills | Status: DC

## 2021-03-15 ENCOUNTER — Telehealth: Payer: Self-pay | Admitting: Behavioral Health

## 2021-03-15 NOTE — Telephone Encounter (Signed)
Pt is upset over death of 66yo Niece unexpectedly this past wknd. Suggested Pt can call the surviving oldest Sibling & they can contact [employer] & [place] Office for further information about the case. Pt acknowledged & agreed, saddened by this death.  Dr. Monna Fam

## 2021-03-15 NOTE — Telephone Encounter (Signed)
Lft msg w/Pt about her call to [facility]. Requested Pt be on the lookout for f/u call in the next day or so. She can also RC to [facility] today & I will attempt to answer if available.   Dr. Monna Fam

## 2021-04-07 ENCOUNTER — Telehealth: Payer: Self-pay | Admitting: Behavioral Health

## 2021-04-07 ENCOUNTER — Institutional Professional Consult (permissible substitution): Payer: Medicare Other | Admitting: Behavioral Health

## 2021-04-07 NOTE — Telephone Encounter (Signed)
Lft several msgs for Pt today & requested she call [facility] to r/s @ her convenience.   Dr. Monna Fam

## 2021-09-05 ENCOUNTER — Other Ambulatory Visit: Payer: Self-pay | Admitting: Internal Medicine

## 2021-09-05 DIAGNOSIS — Z1231 Encounter for screening mammogram for malignant neoplasm of breast: Secondary | ICD-10-CM

## 2021-09-12 IMAGING — MG MM DIGITAL SCREENING BILAT W/ TOMO AND CAD
8 series · 8 of 24 positions shown · non-contrast
Comparison: Previous exam(s).

CLINICAL DATA: Screening.

EXAM:
DIGITAL SCREENING BILATERAL MAMMOGRAM WITH TOMO AND CAD

[L CC synth-2D]
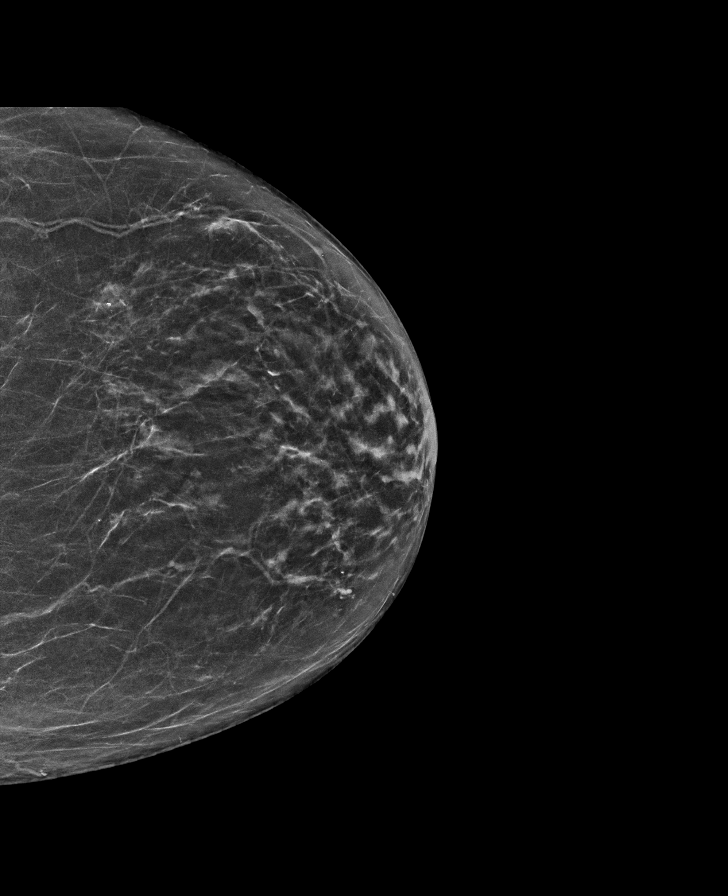

[L MLO synth-2D]
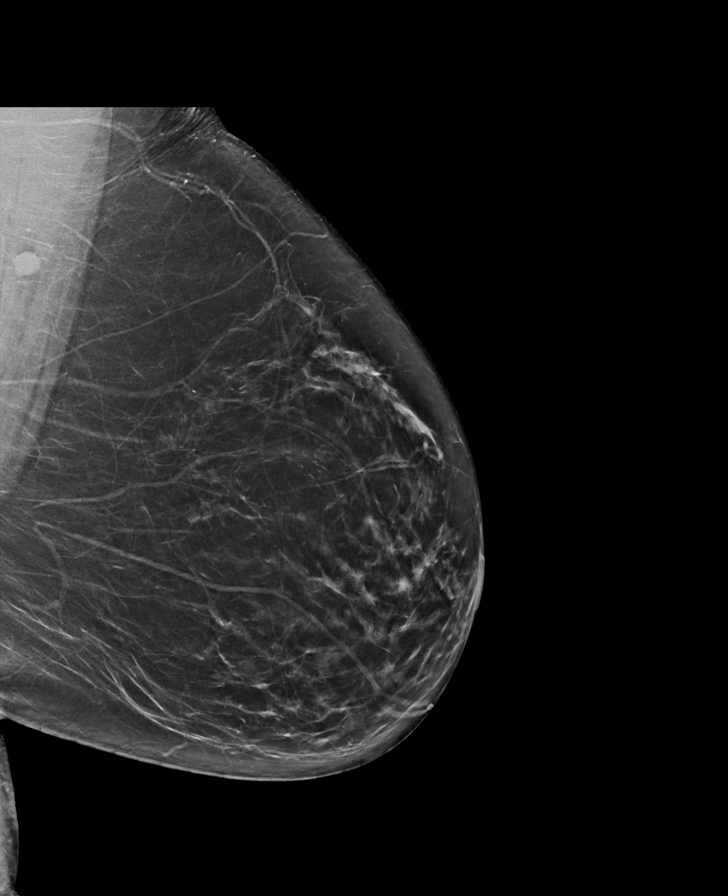

[R MLO synth-2D]
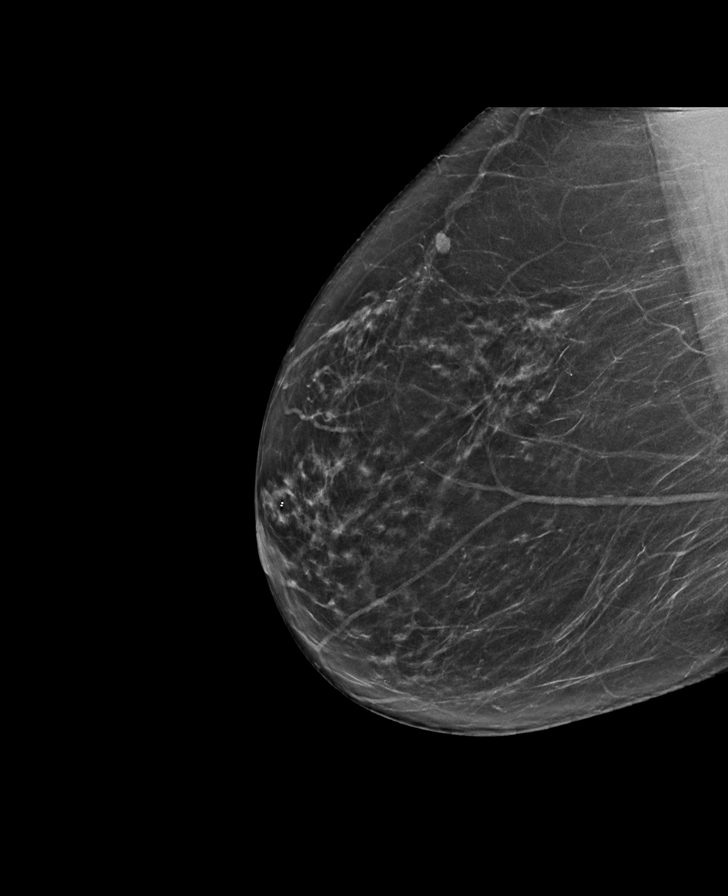

[R CC synth-2D]
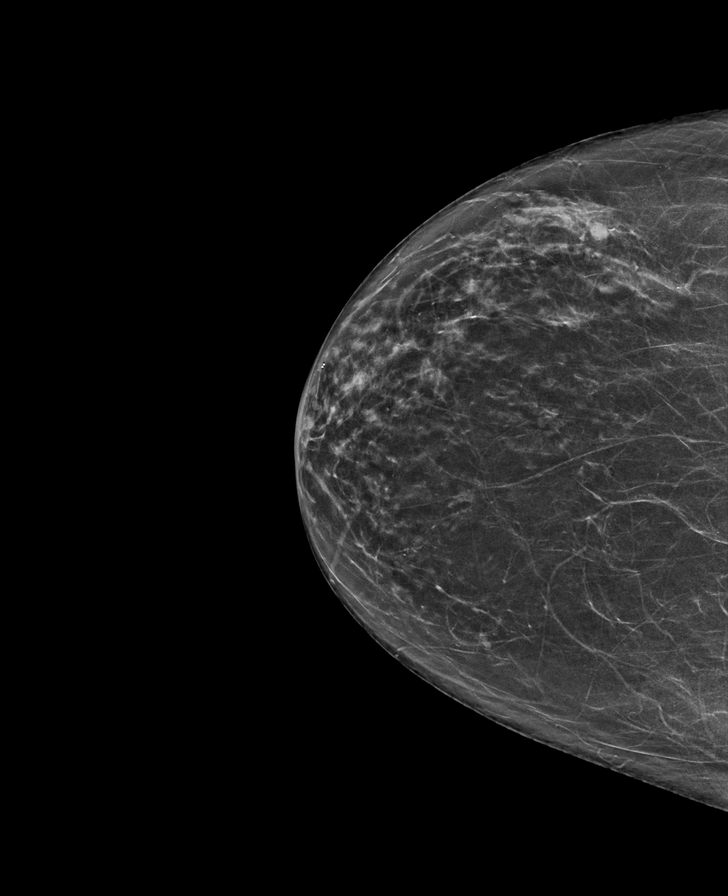

[L CC tomo · tomo slice 31/60.0]
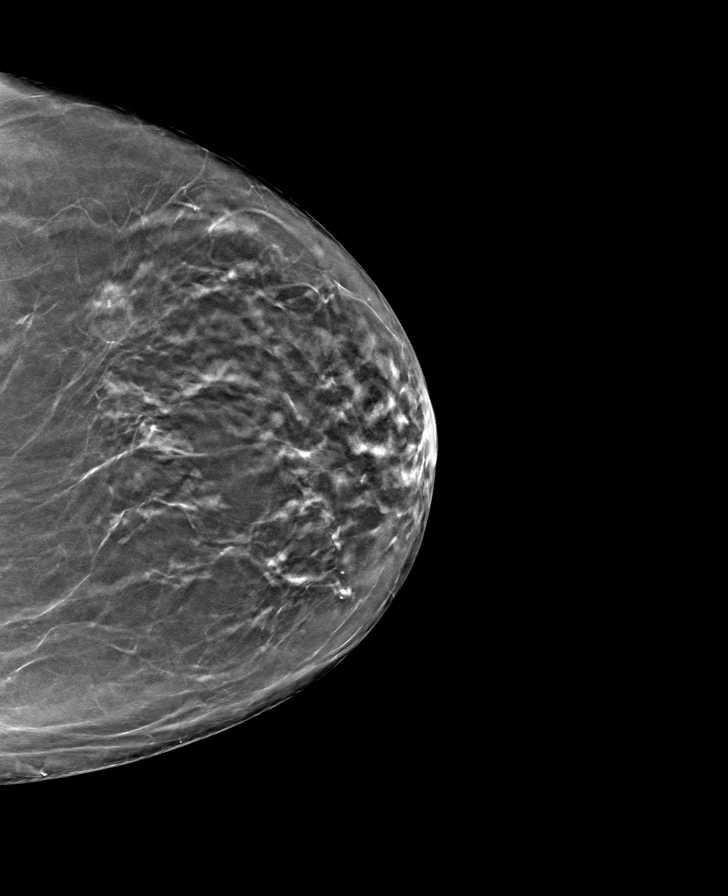

[R MLO tomo · tomo slice 33/66.0]
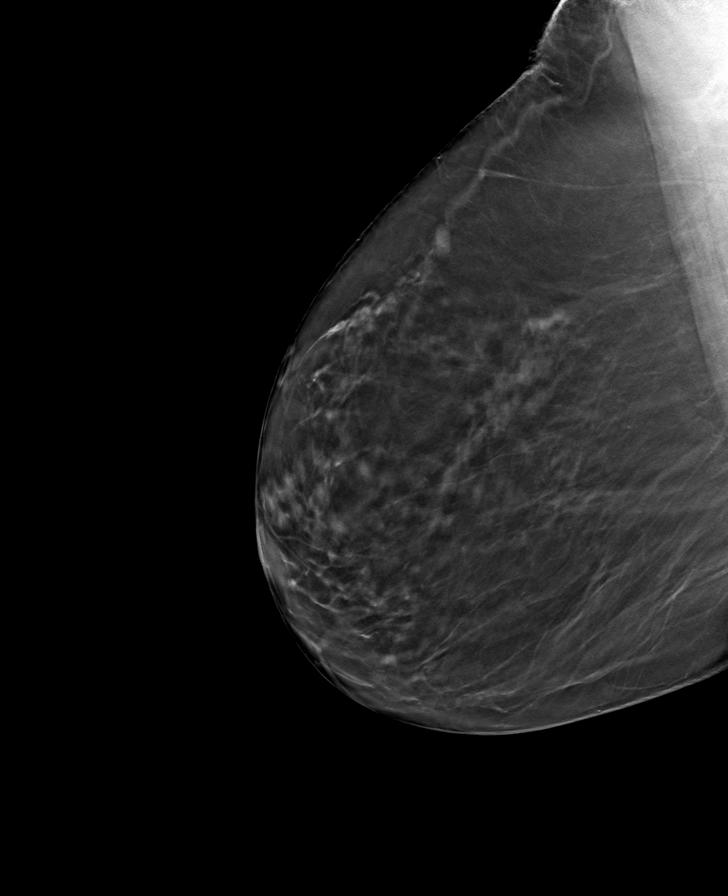

[R CC tomo · tomo slice 32/63.0]
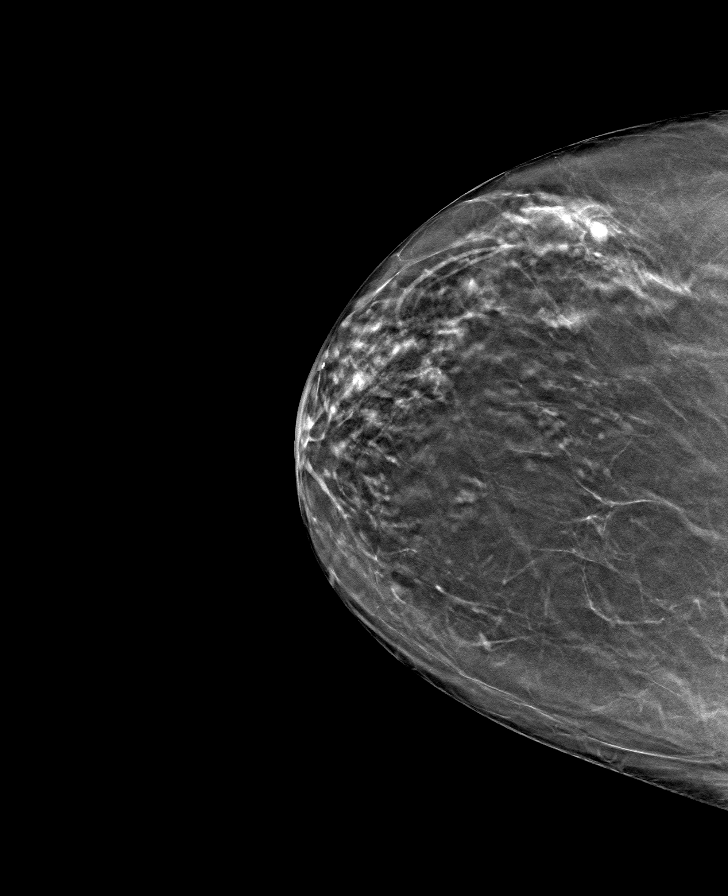

[L MLO tomo · tomo slice 34/67.0]
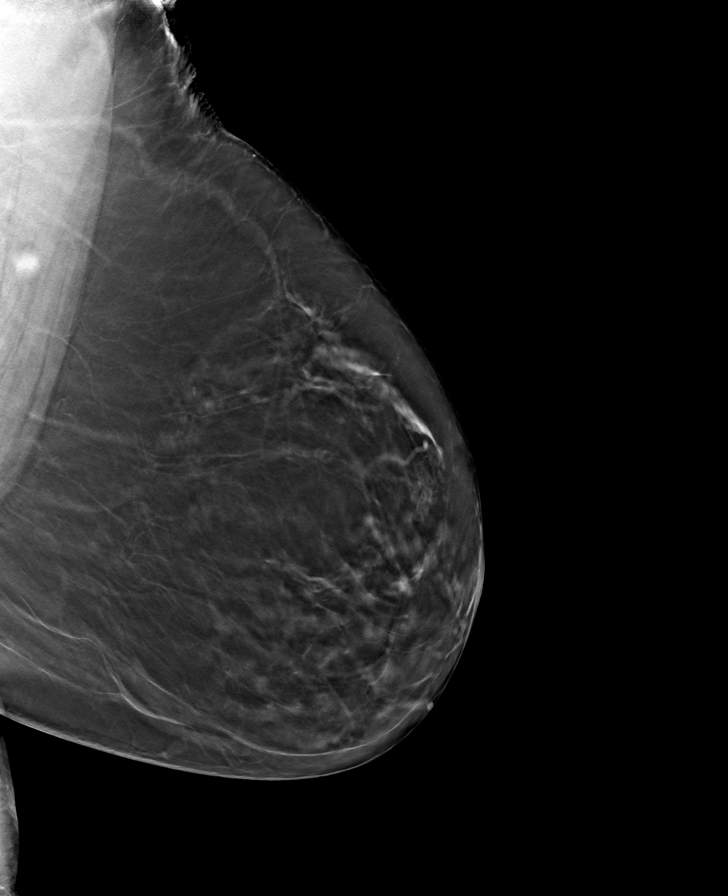

[8 of 24 positions shown; findings below may reference images not displayed]

ACR Breast Density Category b: There are scattered areas of
fibroglandular density.
FINDINGS: There are no findings suspicious for malignancy. The images were
evaluated with computer-aided detection.
IMPRESSION: No mammographic evidence of malignancy. A result letter of this
screening mammogram will be mailed directly to the patient.

RECOMMENDATION:
Screening mammogram in one year. (Code:ZP-7-VX7)

BI-RADS CATEGORY  1: Negative.

## 2021-09-15 ENCOUNTER — Ambulatory Visit
Admission: RE | Admit: 2021-09-15 | Discharge: 2021-09-15 | Disposition: A | Discharge status: Home / Self Care | Payer: Medicare Other | Source: Ambulatory Visit | Attending: Internal Medicine | Admitting: Internal Medicine

## 2021-09-15 ENCOUNTER — Other Ambulatory Visit: Payer: Self-pay

## 2021-09-15 DIAGNOSIS — Z1231 Encounter for screening mammogram for malignant neoplasm of breast: Secondary | ICD-10-CM | POA: Diagnosis not present

## 2021-09-15 DIAGNOSIS — E785 Hyperlipidemia, unspecified: Secondary | ICD-10-CM

## 2021-09-15 MED ORDER — ATORVASTATIN CALCIUM 80 MG PO TABS: ORAL_TABLET | ORAL | 3 refills | Status: DC

## 2021-09-27 ENCOUNTER — Other Ambulatory Visit: Payer: Self-pay | Admitting: *Deleted

## 2021-09-27 DIAGNOSIS — E785 Hyperlipidemia, unspecified: Secondary | ICD-10-CM

## 2021-09-27 NOTE — Telephone Encounter (Signed)
On 5/25, rx was written as "Print" instead  of normal.

## 2021-09-28 MED ORDER — ATORVASTATIN CALCIUM 80 MG PO TABS: ORAL_TABLET | ORAL | 3 refills | Status: DC

## 2021-09-29 ENCOUNTER — Ambulatory Visit (INDEPENDENT_AMBULATORY_CARE_PROVIDER_SITE_OTHER): Payer: Medicare Other | Admitting: Internal Medicine

## 2021-09-29 ENCOUNTER — Encounter: Payer: Self-pay | Admitting: Internal Medicine

## 2021-09-29 VITALS — BP 158/75 | HR 53 | Temp 98.2°F | Ht 64.0 in | Wt 162.1 lb

## 2021-09-29 DIAGNOSIS — K5900 Constipation, unspecified: Secondary | ICD-10-CM

## 2021-09-29 DIAGNOSIS — K219 Gastro-esophageal reflux disease without esophagitis: Secondary | ICD-10-CM | POA: Diagnosis not present

## 2021-09-29 DIAGNOSIS — Z7409 Other reduced mobility: Secondary | ICD-10-CM

## 2021-09-29 DIAGNOSIS — E7841 Elevated Lipoprotein(a): Secondary | ICD-10-CM | POA: Diagnosis not present

## 2021-09-29 DIAGNOSIS — E66811 Obesity, class 1: Secondary | ICD-10-CM

## 2021-09-29 DIAGNOSIS — F329 Major depressive disorder, single episode, unspecified: Secondary | ICD-10-CM

## 2021-09-29 DIAGNOSIS — E669 Obesity, unspecified: Secondary | ICD-10-CM | POA: Diagnosis not present

## 2021-09-29 DIAGNOSIS — H9193 Unspecified hearing loss, bilateral: Secondary | ICD-10-CM

## 2021-09-29 DIAGNOSIS — I10 Essential (primary) hypertension: Secondary | ICD-10-CM

## 2021-09-29 DIAGNOSIS — H60501 Unspecified acute noninfective otitis externa, right ear: Secondary | ICD-10-CM | POA: Diagnosis not present

## 2021-09-29 DIAGNOSIS — H6091 Unspecified otitis externa, right ear: Secondary | ICD-10-CM

## 2021-09-29 LAB — POCT GLYCOSYLATED HEMOGLOBIN (HGB A1C): Hemoglobin A1C: 5.7 % — AB (ref 4.0–5.6)

## 2021-09-29 LAB — GLUCOSE, CAPILLARY: Glucose-Capillary: 90 mg/dL (ref 70–99)

## 2021-09-29 MED ORDER — NEOMYCIN-POLYMYXIN-HC 3.5-10000-1 OT SOLN
2.0000 [drp] | Freq: Three times a day (TID) | OTIC | 0 refills | Status: DC
Start: 2021-09-29 — End: 2021-11-08

## 2021-09-29 MED ORDER — LISINOPRIL 40 MG PO TABS: ORAL_TABLET | ORAL | 3 refills | Status: DC

## 2021-09-29 NOTE — Patient Instructions (Addendum)
Ms. Ebbs, I'm glad to see you're doing so well!  I don't plan to make any changes in your medications, until I see the results of your blood tests.  We talked about getting some Metamucil at the drug store to help with your constipation.  Your right ear canal is irritated, and I've prescribed an ear drop which should help with the irritation.  You can pick it up at the pharmacy.  Your blood pressure is too high, so I am increasing your blood pressure medicine, lisinopril, to 40 mg once a day.  You have been taking 20 mg a day.  Keep your spirits up! Take care and stay well!  Let's get together in about 6 months, but please call if you need to be seen earlier. Dr. Mayford Knife

## 2021-09-29 NOTE — Progress Notes (Signed)
Established Patient Office Visit  Subjective   Patient ID: Miranda Gaines, female    DOB: March 22, 1956  Age: 66 y.o. MRN: [id number]   F/u of HTN, hyperlipidemia, chronic pain, depressed mood  HPI 66 yo [name] presents for routine f/u.  In the interim, she reports that she has been doing increasingly better. Spoke to [facility] therapist Dr. Monna Fam about grief last year, very helpful.  Still constipated, her psychiatrist informed her that the nortriptyline (prescribed there for sleep and depression) wasn't the problem - sees them at [facility] every 3 months.  Also takes trazodone for same indications, which remains at fairly low dose.  She questions if eating hushpuppes or cornmeal causes constipation (low fiber, it's possible but probably insufficient to cause constipation alone).  Eats fruit including warm prune juice, prunes, bananas oranges, broccoli, carrots.  Eats minimal meat, as she feels it causes hearburn, worse at night time; no dysphagia, though she has had esophagus dilated in the past.   She is worried about increasing her pantoprazole, doesn't like to take meds.  Advise taking metamucil.   Ear canals, particularly on R, have been itching.  Due for tubing to be changed in hearing aides, appt upcoming.   Has vision problems but [facility] doesn't pay for transportation to the appt unless she is needing surgery. She will use [facility], $2.50 each way.  ROS no difficulty breathing, no chest discomfort, no LE edema, no difficulty with bladder frequency/urgency/incontinence.  + swelling in feet, constipation, nocturnal reflux heartburn, nocturnal plantar cramps usually during cold, doesn't happen if she wears socks to bed.   Function - Uses motorized WC, not ambulatory.  Pivots from chair for transfers, shower bench, can't hold balance - weak and shaking LLE.  Problems subsequent to back surgery.  Current Outpatient Medications:    amLODipine (NORVASC) 10 MG tablet, Take 1 tablet (10 mg total) by  mouth daily., Disp: 90 tablet, Rfl: 3   atorvastatin (LIPITOR) 80 MG tablet, TAKE 1 TABLET BY MOUTH ONCE DAILY AT 6 PM, Disp: 90 tablet, Rfl: 3   cyclobenzaprine (FLEXERIL) 10 MG tablet, Take 10 mg by mouth 3 (three) times daily as needed. Muscle spasms, Disp: , Rfl:    DEBROX 6.5 % OTIC solution, INSTILL 5 DROPS INTO THE LEFT EAR TWICE DAILY, Disp: 10 mL, Rfl: 0   Diclofenac Sodium 1.6 % GEL, Apply 2 g topically 4 (four) times daily as needed. To painful area of R arm and elbow., Disp: 100 g, Rfl: 0   ezetimibe (ZETIA) 10 MG tablet, Take 1 tablet (10 mg total) by mouth daily., Disp: 90 tablet, Rfl: 3   fluticasone (FLONASE) 50 MCG/ACT nasal spray, INSTILL 1 SPRAY IN EACH NOSTRIL DAILY, Disp: 16 mL, Rfl: 3   lisinopril (ZESTRIL) 10 MG tablet, Take 2 tablets (20 mg total) by mouth daily., Disp: 90 tablet, Rfl: 3   nortriptyline (PAMELOR) 75 MG capsule, Take 75 mg by mouth at bedtime., Disp: , Rfl:    pantoprazole (PROTONIX) 20 MG tablet, Take 1 tablet (20 mg total) by mouth daily., Disp: 90 tablet, Rfl: 3   psyllium (METAMUCIL SMOOTH TEXTURE) 58.6 % powder, Take 1 packet by mouth 3 (three) times daily., Disp: 283 g, Rfl: 12   traZODone (DESYREL) 50 MG tablet, Take 50 mg by mouth at bedtime. Prescribed by Mental Health. , Disp: , Rfl:    Objective:   BP (!) 158/75 (BP Location: Right Arm, Patient Position: Sitting, Cuff Size: Small)   Pulse (!) 53  Temp 98.2 F (36.8 C) (Oral)   Ht 5\' 4"  (1.626 m)   Wt 162 lb 1.6 oz (73.5 kg)   SpO2 100%   BMI 27.82 kg/m   BP Readings from Last 3 Encounters:  09/29/21 (!) 158/75  12/16/20 (!) 157/90  09/30/19 (!) 142/71   Wt Readings from Last 3 Encounters:  09/29/21 162 lb 1.6 oz (73.5 kg)  09/30/19 160 lb 12.8 oz (72.9 kg)  03/18/19 173 lb (78.5 kg)   Physical Exam Arrives in motorized WC, able to reach down to don/doff socks w/o apparent discomfort or impairment.  Feet in good repair, nails trimmed, no fungal infection.  Dp pulse 2+.   No LE  edema (resolves overnight in bed).  2+ dp pulses.  L ear canal patent, scant cerumen, no inflammation, TM nml.  R ear canal examined by my collegue; erythematous and irritated, smaller caliber, minimal cerumen, TM nml.  Heart RRR no murmur, lungs clear.  The 10-year ASCVD risk score (Arnett DK, et al., 2019) is: 26%  Former smoker, quit 2015 Assessment & Plan:  Hypertension Increase lisinopril from 20 mg daily to 40 mg daily.  Check renal panel today. Goal SBP < 130.    GERD (gastroesophageal reflux disease) Symptoms are active at night - burning esophageal pain when recumbent.  She is reticent to increase pantoprazole from 20 to 40 and wishes to try to manage with diet.    Recurrent otitis externa of right ear R canal erythematous, irritated, mild edema.  No cerumen impaction, TM nml.  There may be an eczematous component.  Cortisporin Otic.  Hearing aide tubes are due to be changed soon.    Constipation She reports her nortriptyline prescriber didn't agree that the tca nortriptyline was contributing to constipation.  [facility].  She is managing with fiber foods.  Metamucil suggested as an additional fiber supplement.    Depression BH referral was beneficial, and she is doing much better.  Continues to follow with [facility].  PHQ2 0 today.  We didn't complete PHQ9 as we should have.  She would like to transfer her BH therapy to Dr. 2016 new practice.  I forwarded a mssg to her which was acknowledged.  Continues on nortriptyline and trazadone for depression and insomnia per [facility].  Impaired physical mobility Function - Uses motorized WC, not ambulatory.  Pivots from chair for transfers, shower bench, can't hold balance - weak and shaking LLE.  Problems subsequent to back surgery.  Independent in all ADLs aside from ambulation.  She demonstrated easily bending down to don/doff shoes and socks during visit.   Overweight (BMI 25.0-29.9) Obesity has resolved!  Dx changed to overweight.  Screen for steatohepatitis and for DM.  Hyperlipidemia On max atorvastatin, and ezetimibe.  Check lipids today.   RTC 81M, earlier if needed.   02-24-2001, MD

## 2021-09-30 ENCOUNTER — Encounter: Payer: Self-pay | Admitting: Internal Medicine

## 2021-09-30 DIAGNOSIS — H9190 Unspecified hearing loss, unspecified ear: Secondary | ICD-10-CM | POA: Insufficient documentation

## 2021-09-30 DIAGNOSIS — IMO0001 Reserved for inherently not codable concepts without codable children: Secondary | ICD-10-CM | POA: Insufficient documentation

## 2021-09-30 HISTORY — DX: Unspecified hearing loss, unspecified ear: H91.90

## 2021-09-30 HISTORY — DX: Reserved for inherently not codable concepts without codable children: IMO0001

## 2021-09-30 LAB — CMP14 + ANION GAP
ALT: 12 IU/L (ref 0–32)
AST: 16 IU/L (ref 0–40)
Albumin/Globulin Ratio: 1.8 (ref 1.2–2.2)
Albumin: 4.4 g/dL (ref 3.8–4.8)
Alkaline Phosphatase: 72 IU/L (ref 44–121)
Anion Gap: 12 mmol/L (ref 10.0–18.0)
BUN/Creatinine Ratio: 11 — ABNORMAL LOW (ref 12–28)
BUN: 10 mg/dL (ref 8–27)
Bilirubin Total: 0.2 mg/dL (ref 0.0–1.2)
CO2: 22 mmol/L (ref 20–29)
Calcium: 9.4 mg/dL (ref 8.7–10.3)
Chloride: 105 mmol/L (ref 96–106)
Creatinine, Ser: 0.89 mg/dL (ref 0.57–1.00)
Globulin, Total: 2.5 g/dL (ref 1.5–4.5)
Glucose: 87 mg/dL (ref 70–99)
Potassium: 4.2 mmol/L (ref 3.5–5.2)
Sodium: 139 mmol/L (ref 134–144)
Total Protein: 6.9 g/dL (ref 6.0–8.5)
eGFR: 71 mL/min/{1.73_m2} (ref >59)

## 2021-09-30 LAB — LIPID PANEL
Chol/HDL Ratio: 5.8 ratio — ABNORMAL HIGH (ref 0.0–4.4)
Cholesterol, Total: 227 mg/dL — ABNORMAL HIGH (ref 100–199)
HDL: 39 mg/dL — ABNORMAL LOW (ref >39)
LDL Chol Calc (NIH): 172 mg/dL — ABNORMAL HIGH (ref 0–99)
Triglycerides: 88 mg/dL (ref 0–149)
VLDL Cholesterol Cal: 16 mg/dL (ref 5–40)

## 2021-09-30 NOTE — Assessment & Plan Note (Signed)
She reports her nortriptyline prescriber didn't agree that the tca nortriptyline was contributing to constipation.  [facility].  She is managing with fiber foods.  Metamucil suggested as an additional fiber supplement.

## 2021-09-30 NOTE — Assessment & Plan Note (Signed)
Function - Uses motorized WC, not ambulatory.  Pivots from chair for transfers, shower bench, can't hold balance - weak and shaking LLE.  Problems subsequent to back surgery.  Independent in all ADLs aside from ambulation.  She demonstrated easily bending down to don/doff shoes and socks during visit.

## 2021-09-30 NOTE — Assessment & Plan Note (Signed)
BH referral was beneficial, and she is doing much better.  Continues to follow with [facility].  PHQ2 0 today.  We didn't complete PHQ9 as we should have.  She would like to transfer her BH therapy to Dr. Gean Quint new practice.  I forwarded a mssg to her which was acknowledged.  Continues on nortriptyline and trazadone for depression and insomnia per [facility].

## 2021-09-30 NOTE — Assessment & Plan Note (Signed)
On max atorvastatin, and ezetimibe.  Check lipids today.

## 2021-09-30 NOTE — Assessment & Plan Note (Addendum)
Obesity has resolved!  Dx changed to overweight. Screen for steatohepatitis and for DM.

## 2021-09-30 NOTE — Assessment & Plan Note (Signed)
R canal erythematous, irritated, mild edema.  No cerumen impaction, TM nml.  There may be an eczematous component.  Cortisporin Otic.  Hearing aide tubes are due to be changed soon.

## 2021-09-30 NOTE — Assessment & Plan Note (Signed)
Symptoms are active at night - burning esophageal pain when recumbent.  She is reticent to increase pantoprazole from 20 to 40 and wishes to try to manage with diet.

## 2021-09-30 NOTE — Assessment & Plan Note (Signed)
Increase lisinopril from 20 mg daily to 40 mg daily.  Check renal panel today. Goal SBP < 130.

## 2021-11-08 ENCOUNTER — Other Ambulatory Visit: Payer: Self-pay | Admitting: Internal Medicine

## 2021-11-08 ENCOUNTER — Other Ambulatory Visit: Payer: Self-pay

## 2021-11-08 DIAGNOSIS — K219 Gastro-esophageal reflux disease without esophagitis: Secondary | ICD-10-CM

## 2021-11-08 DIAGNOSIS — H60501 Unspecified acute noninfective otitis externa, right ear: Secondary | ICD-10-CM

## 2021-11-08 MED ORDER — PANTOPRAZOLE SODIUM 20 MG PO TBEC: 20.0000 mg | DELAYED_RELEASE_TABLET | Freq: Every day | ORAL | 3 refills | Status: DC

## 2021-11-08 MED ORDER — NEOMYCIN-POLYMYXIN-HC 3.5-10000-1 OT SOLN: 2.0000 [drp] | Freq: Three times a day (TID) | OTIC | 0 refills | Status: DC

## 2021-11-08 MED ORDER — AMLODIPINE BESYLATE 10 MG PO TABS: 10.0000 mg | ORAL_TABLET | Freq: Every day | ORAL | 3 refills | Status: DC

## 2021-11-24 ENCOUNTER — Other Ambulatory Visit: Payer: Self-pay

## 2021-11-24 DIAGNOSIS — H60501 Unspecified acute noninfective otitis externa, right ear: Secondary | ICD-10-CM

## 2021-11-24 NOTE — Telephone Encounter (Signed)
No refill intended for this short term treatment

## 2021-11-25 ENCOUNTER — Ambulatory Visit: Payer: Self-pay

## 2021-11-25 NOTE — Patient Outreach (Signed)
  Care Coordination   Initial Visit Note   11/25/2021 Name: Miranda Gaines MRN: [id number] DOB: 1955-05-13  Miranda Gaines is a 66 y.o. year old female who sees Mayford Knife, Dorene Ar, MD for primary care. I spoke with  Miranda Gaines by phone today  What matters to the patients health and wellness today?  "I am doing ok, I had some issues with my prescriptions being delivered to the wrong apartment, but aside from that I am good".    Goals Addressed   None     SDOH assessments and interventions completed:  Yes     Care Coordination Interventions Activated:  No  Care Coordination Interventions:  No, not indicated   Follow up plan: No further intervention required.   Encounter Outcome:  Pt. Visit Completed

## 2022-01-10 ENCOUNTER — Other Ambulatory Visit: Payer: Self-pay

## 2022-01-10 DIAGNOSIS — E785 Hyperlipidemia, unspecified: Secondary | ICD-10-CM

## 2022-01-11 MED ORDER — EZETIMIBE 10 MG PO TABS: 10.0000 mg | ORAL_TABLET | Freq: Every day | ORAL | 3 refills | Status: DC

## 2022-01-18 ENCOUNTER — Ambulatory Visit (INDEPENDENT_AMBULATORY_CARE_PROVIDER_SITE_OTHER): Payer: Medicare Other

## 2022-01-18 DIAGNOSIS — Z23 Encounter for immunization: Secondary | ICD-10-CM

## 2022-01-23 ENCOUNTER — Other Ambulatory Visit: Payer: Self-pay

## 2022-01-23 DIAGNOSIS — K219 Gastro-esophageal reflux disease without esophagitis: Secondary | ICD-10-CM

## 2022-01-23 MED ORDER — PANTOPRAZOLE SODIUM 20 MG PO TBEC: 20.0000 mg | DELAYED_RELEASE_TABLET | Freq: Every day | ORAL | 3 refills | Status: DC

## 2022-01-25 ENCOUNTER — Other Ambulatory Visit: Payer: Self-pay | Admitting: Internal Medicine

## 2022-01-25 DIAGNOSIS — K219 Gastro-esophageal reflux disease without esophagitis: Secondary | ICD-10-CM

## 2022-01-25 MED ORDER — PANTOPRAZOLE SODIUM 20 MG PO TBEC: 20.0000 mg | DELAYED_RELEASE_TABLET | Freq: Every day | ORAL | 3 refills | Status: DC

## 2022-01-25 NOTE — Telephone Encounter (Signed)
Please resend rx to pharmacy listed. [facility]

## 2022-01-27 DIAGNOSIS — M961 Postlaminectomy syndrome, not elsewhere classified: Secondary | ICD-10-CM | POA: Diagnosis not present

## 2022-01-27 DIAGNOSIS — M5416 Radiculopathy, lumbar region: Secondary | ICD-10-CM | POA: Diagnosis not present

## 2022-02-01 ENCOUNTER — Other Ambulatory Visit: Payer: Self-pay

## 2022-02-01 DIAGNOSIS — I10 Essential (primary) hypertension: Secondary | ICD-10-CM

## 2022-02-01 MED ORDER — LISINOPRIL 40 MG PO TABS: ORAL_TABLET | ORAL | 3 refills | Status: AC

## 2022-05-01 DIAGNOSIS — H905 Unspecified sensorineural hearing loss: Secondary | ICD-10-CM | POA: Diagnosis not present

## 2022-05-03 ENCOUNTER — Encounter: Payer: Self-pay | Admitting: Internal Medicine

## 2022-05-03 NOTE — Progress Notes (Signed)
67 yo Miranda Gaines presents for routine 5M f/u of HTN, hyperlipidemia, chronic pain, depressed mood, constipation (medication?) , last seen by me in 09/2021    In the interim, she had annual f/u visit 01/2022 with P.A. at [facility] ([place]) at which time her cyclobenzaprine was refilled and it was noted that she continued to have significant pain interfering with activities.  Numbness of middle toes was reported for which she was advised to see a podiatrist.    Constipation - takes prune juice, bloated tries not to eat low fiber foods.   She explains that bumps on her nose or gum pain or headache tells her that she needs to take a laxative.  Repeats consistent concerns about various foods and constipation.  Did not start the metamucil recommended at last visit.  Has vision problems but [facility] doesn't pay for transportation to the appt unless she is needing surgery. She will use [facility], $2.50 each way.Vision? Can't get appt because Medicare won't pay for preventative eye appt transport - will consult [facility]  Continues to see [facility] providers at [facility], and endorses ongoing treatment with nortriptyline, hydroxizine, trazadone, etc.  She knows that a couple of these are strongly associated with constipation though apparently her prescriber didn't agree. She declines visits with our new [place] clinician, Miranda Gaines, feeling that talk therapy isn't currently necessary.  She reports  that new hearing aides have been ordered and should arrive in the coming weeks.  Also feels discomfort along distribution of her cephalic vein  Feeling well otherwise.  Exam BP (!) 147/81 (BP Location: Right Arm, Patient Position: Sitting, Cuff Size: Small)   Pulse 73   Temp 98.2 F (36.8 C) (Oral)   Ht 5\' 4"  (1.626 m)   Wt 162 lb 9.6 oz (73.8 kg)   SpO2 100%   BMI 27.91 kg/m  Feet no edema (though she feels subjectively that they are puffy).  Symmetrically warm, dp pulses are 1+.  Toes nml appearance.   Sensation to monofilament is normal over feet and toes, though she senses numb feeling with light touch.  Nml toe movement, though L 1st toe extension is weaker (hx of radiculopathy). Lateral toe compression makes her feel as though the foot will cramp but doesn't elicit pain.  R cephalic vein is minimally tender to palpation, not thrombosed, not red.  Her RUE venous pattern is a bit more prominent than on the left, though appearance is otherwise normal.  Hands are symmetrically warm, radial pulses 2+ each.    Assessment and plan (problems not in particular order)  Hypertension Uncontrolled on Lisinopril 40 mg daily and amlodipine 10 mg daily, if she is actually taking these medications.  She did not bring meds with her.  Refill histories are suspect.  Will invite [facility] pharmacist to help review.  Hearing impairment; bilateral hearing aides Awaiting new hearing aids, which shall arrive soon.  She is certainly hearing impaired during conversation, leans in and turns her right ear toward speaker.  Facial rash Today Miranda Gaines was describing painful bumps erupting across her nasal bridge and upper cheeks which indicate to her that she is constipated.  She endorses resolution of these bumps as soon as her bowels move.  Miranda Gaines has interesting interpretations of her bodily sensations and investigation of symptoms is challenging.  No lesions visible today.  Overweight (BMI 25.0-29.9) Weight remains stable.  Monitor.  Hyperlipidemia Lipid panel 09/2021: TC 227, LDL 172, HDL 39, TG 88.  She endorses taking all of her medicines including atorvastatin 80 mg and ezetimibe 10 mg daily, though Zetia refills have not been consistent.  Markedly elevated lipids despite treatment-I will reach out to [place] for assistance with management.  Prediabetes New diagnosis, A1c 5.9 today.  She is concerned, as no diabetes runs in her family and she feels that she is following a healthy diet.  She was reassured.   Will continue to keep an eye on this.  Drug induced constipation Symptoms continue, which she manages with prune juice effectively.  She did not begin Metamucil discussed at last visit.  She feels a number of unusual sensations when she feels her bowels need to move-including headache and a breakout across the bridge of her nose and cheeks.  We discussed the role of her medications and constipation again.  These are prescribed by her [facility] provider.  Depression PHQ 2 is 0 today, she continues on nortriptyline, hydroxyzine, and trazodone prescribed at [facility] which she reports attending every 3 months.  She declines referral to our new behavioral health clinician Miranda Gaines as her symptoms are well-controlled at this time.  GERD without esophagitis Symptoms continue unchanged, exacerbated by eating meat, fatty foods, and crunchy foods (chips) which she tries to avoid.  She feels like her proton pump inhibitor is managing this to her satisfaction and declines increasing dose..  Numbness of toes Approximately 1 year duration of numbness of bilateral third and fourth toes "feels like they're going dead', unassociated with pain, worsened by cold (wears sock to bed, even in summer). Feet have plantar flexion cramping when cold as well.  Sensation is intact with monofilament testing, though she feels an abnormality/dysesthesia with gross touch.  Color of the toes is normal, no swelling.  No discomfort in the digit joints.  When her foot is compressed laterally, she does not feel pain but instead feels as though her feet will cramp/spasm.  DP pulses are 1+ bilaterally, and screening ABIs in the office are normal.  While it would be unusual to have Morton's neuroma without the characteristic pain, this is on my differential and I will refer her to a podiatrist for opinion.  Miranda Gaines will not submit to EMG/NCS's as she had a miserable experience with this in the past when investigating radiculopathy.  Of note,  the distribution of her numbness/dysesthesia is not a radicular pattern.  Tender vein Miranda Gaines points out her right cephalic vein, which is slightly more prominent than her left, and which has been tender.  There is no erythema to suggest phlebitis, and the vein is not thrombosed.  I cannot feel adenopathy in the elbow nor at her shoulder, and there is no JVD on the right.  Holding her arm upright over her head drains the arm and diminishes her vein which she finds reassuring.  She has no concept of what veins and arteries are.  We did some basic teaching and I asked her to let us know if this problem worsens.

## 2022-05-04 ENCOUNTER — Ambulatory Visit (INDEPENDENT_AMBULATORY_CARE_PROVIDER_SITE_OTHER): Payer: Medicare Other | Admitting: Internal Medicine

## 2022-05-04 VITALS — BP 147/81 | HR 73 | Temp 98.2°F | Ht 64.0 in | Wt 162.6 lb

## 2022-05-04 DIAGNOSIS — K5903 Drug induced constipation: Secondary | ICD-10-CM

## 2022-05-04 DIAGNOSIS — R21 Rash and other nonspecific skin eruption: Secondary | ICD-10-CM | POA: Diagnosis not present

## 2022-05-04 DIAGNOSIS — Z87891 Personal history of nicotine dependence: Secondary | ICD-10-CM | POA: Diagnosis not present

## 2022-05-04 DIAGNOSIS — R200 Anesthesia of skin: Secondary | ICD-10-CM | POA: Diagnosis not present

## 2022-05-04 DIAGNOSIS — F32A Depression, unspecified: Secondary | ICD-10-CM

## 2022-05-04 DIAGNOSIS — I10 Essential (primary) hypertension: Secondary | ICD-10-CM

## 2022-05-04 DIAGNOSIS — H9193 Unspecified hearing loss, bilateral: Secondary | ICD-10-CM | POA: Diagnosis not present

## 2022-05-04 DIAGNOSIS — Z6825 Body mass index (BMI) 25.0-25.9, adult: Secondary | ICD-10-CM

## 2022-05-04 DIAGNOSIS — K219 Gastro-esophageal reflux disease without esophagitis: Secondary | ICD-10-CM

## 2022-05-04 DIAGNOSIS — R0989 Other specified symptoms and signs involving the circulatory and respiratory systems: Secondary | ICD-10-CM

## 2022-05-04 DIAGNOSIS — R7303 Prediabetes: Secondary | ICD-10-CM

## 2022-05-04 DIAGNOSIS — E7841 Elevated Lipoprotein(a): Secondary | ICD-10-CM | POA: Diagnosis not present

## 2022-05-04 DIAGNOSIS — R209 Unspecified disturbances of skin sensation: Secondary | ICD-10-CM

## 2022-05-04 DIAGNOSIS — E663 Overweight: Secondary | ICD-10-CM | POA: Diagnosis not present

## 2022-05-04 LAB — POCT GLYCOSYLATED HEMOGLOBIN (HGB A1C): Hemoglobin A1C: 5.9 % — AB (ref 4.0–5.6)

## 2022-05-04 LAB — GLUCOSE, CAPILLARY: Glucose-Capillary: 94 mg/dL (ref 70–99)

## 2022-05-04 NOTE — Patient Instructions (Addendum)
Miranda Gaines,  It was good to catch up with you today.  We spoke about the numbness of your toes, and we are checking into your foot circulation as well as referring you to a foot specialist to determine if you are experiencing a pinched nerve in your feet.  I have asked one of our pharmacists to reach out to you to help sort out which medicines you are taking and which ones to need to continue.  We checked for circulation problems in your feet today and your test was normal.  We checked for diabetes today. You don't have diabetes, but your blood sugar is in the "pre-diabetes" range.  Keep an eye on your diet as you're already doing.  We'll recheck this twice a year.  Lets get together again in 6 months for routine evaluation, but please schedule a visit sooner if you are having any problems at all.  Take care, Dr. Jimmye Norman

## 2022-05-05 ENCOUNTER — Telehealth: Payer: Self-pay

## 2022-05-05 DIAGNOSIS — R0989 Other specified symptoms and signs involving the circulatory and respiratory systems: Secondary | ICD-10-CM | POA: Insufficient documentation

## 2022-05-05 DIAGNOSIS — R200 Anesthesia of skin: Secondary | ICD-10-CM | POA: Insufficient documentation

## 2022-05-05 DIAGNOSIS — R7303 Prediabetes: Secondary | ICD-10-CM | POA: Insufficient documentation

## 2022-05-05 NOTE — Assessment & Plan Note (Addendum)
Approximately 1 year duration of numbness of bilateral third and fourth toes "feels like they're going dead', unassociated with pain, worsened by cold (wears sock to bed, even in summer). Feet have plantar flexion cramping when cold as well.  Sensation is intact with monofilament testing, though she feels an abnormality/dysesthesia with gross touch.  Color of the toes is normal, no swelling.  No discomfort in the digit joints.  When her foot is compressed laterally, she does not feel pain but instead feels as though her feet will cramp/spasm.  DP pulses are 1+ bilaterally, and screening ABIs in the office are normal.  While it would be unusual to have Morton's neuroma without the characteristic pain, this is on my differential and I will refer her to a podiatrist for opinion.  Ms. Pedregon will not submit to EMG/NCS's as she had a miserable experience with this in the past when investigating radiculopathy.  Of note, the distribution of her numbness/dysesthesia is not a radicular pattern.

## 2022-05-05 NOTE — Assessment & Plan Note (Signed)
Symptoms continue unchanged, exacerbated by eating meat, fatty foods, and crunchy foods (chips) which she tries to avoid.  She feels like her proton pump inhibitor is managing this to her satisfaction and declines increasing dose.Marland Kitchen

## 2022-05-05 NOTE — Assessment & Plan Note (Signed)
Awaiting new hearing aids, which shall arrive soon.  She is certainly hearing impaired during conversation, leans in and turns her right ear toward speaker.

## 2022-05-05 NOTE — Assessment & Plan Note (Signed)
Uncontrolled on Lisinopril 40 mg daily and amlodipine 10 mg daily, if she is actually taking these medications.  She did not bring meds with her.  Refill histories are suspect.  Will invite [facility] pharmacist to help review.

## 2022-05-05 NOTE — Assessment & Plan Note (Signed)
Miranda Gaines points out her right cephalic vein, which is slightly more prominent than her left, and which has been tender.  There is no erythema to suggest phlebitis, and the vein is not thrombosed.  I cannot feel adenopathy in the elbow nor at her shoulder, and there is no JVD on the right.  Holding her arm upright over her head drains the arm and diminishes her vein which she finds reassuring.  She has no concept of what veins and arteries are.  We did some basic teaching and I asked her to let us know if this problem worsens.

## 2022-05-05 NOTE — Assessment & Plan Note (Signed)
PHQ 2 is 0 today, she continues on nortriptyline, hydroxyzine, and trazodone prescribed at [facility] which she reports attending every 3 months.  She declines referral to our new behavioral health clinician Ms. Ashe as her symptoms are well-controlled at this time.

## 2022-05-05 NOTE — Assessment & Plan Note (Signed)
New diagnosis, A1c 5.9 today.  She is concerned, as no diabetes runs in her family and she feels that she is following a healthy diet.  She was reassured.  Will continue to keep an eye on this.

## 2022-05-05 NOTE — Progress Notes (Signed)
   Care Guide Note  05/05/2022 Name: Miranda Gaines MRN: [id number] DOB: 1955/09/14  Referred by: Angelica Pou, MD Reason for referral : Care Coordination (Outreach to schedule with Pharm D )   Miranda Gaines is a 67 y.o. year old female who is a primary care patient of Angelica Pou, MD. [name] was referred to the pharmacist for assistance related to HTN.    Successful contact was made with the patient to discuss pharmacy services including being ready for the pharmacist to call at least 5 minutes before the scheduled appointment time, to have medication bottles and any blood sugar or blood pressure readings ready for review. The patient agreed to meet with the pharmacist via with the pharmacist via telephone visit on (date/time).  05/10/2022  Noreene Larsson, [place], [place] [address] Direct Dial: [phone] [email or web address]

## 2022-05-05 NOTE — Assessment & Plan Note (Signed)
Lipid panel 09/2021: TC 227, LDL 172, HDL 39, TG 88.  She endorses taking all of her medicines including atorvastatin 80 mg and ezetimibe 10 mg daily, though Zetia refills have not been consistent.  Markedly elevated lipids despite treatment-I will reach out to [place] for assistance with management.

## 2022-05-05 NOTE — Assessment & Plan Note (Signed)
Weight remains stable.  Monitor.

## 2022-05-05 NOTE — Assessment & Plan Note (Signed)
Today Miranda Gaines was describing painful bumps erupting across her nasal bridge and upper cheeks which indicate to her that she is constipated.  She endorses resolution of these bumps as soon as her bowels move.  Miranda Gaines has interesting interpretations of her bodily sensations and investigation of symptoms is challenging.  No lesions visible today.

## 2022-05-05 NOTE — Assessment & Plan Note (Signed)
Symptoms continue, which she manages with prune juice effectively.  She did not begin Metamucil discussed at last visit.  She feels a number of unusual sensations when she feels her bowels need to move-including headache and a breakout across the bridge of her nose and cheeks.  We discussed the role of her medications and constipation again.  These are prescribed by her [facility] provider.

## 2022-05-10 ENCOUNTER — Telehealth: Payer: Self-pay | Admitting: Pharmacist

## 2022-05-10 ENCOUNTER — Other Ambulatory Visit: Payer: Medicare Other | Admitting: Pharmacist

## 2022-05-10 NOTE — Progress Notes (Signed)
Attempted to contact patient for scheduled appointment for medication management. Mail box is full, I was unable to leave a voicemail.   Will collaborate with scheduling team to outreach to reschedule.   Catie Hedwig Morton, PharmD, [place], [facility] [phone]

## 2022-05-17 ENCOUNTER — Telehealth: Payer: Self-pay

## 2022-05-17 ENCOUNTER — Encounter: Payer: Self-pay | Admitting: Podiatry

## 2022-05-17 ENCOUNTER — Ambulatory Visit (INDEPENDENT_AMBULATORY_CARE_PROVIDER_SITE_OTHER): Payer: 59 | Admitting: Podiatry

## 2022-05-17 DIAGNOSIS — G5763 Lesion of plantar nerve, bilateral lower limbs: Secondary | ICD-10-CM | POA: Diagnosis not present

## 2022-05-17 DIAGNOSIS — Z8719 Personal history of other diseases of the digestive system: Secondary | ICD-10-CM | POA: Insufficient documentation

## 2022-05-17 DIAGNOSIS — K311 Adult hypertrophic pyloric stenosis: Secondary | ICD-10-CM | POA: Insufficient documentation

## 2022-05-17 HISTORY — DX: Personal history of other diseases of the digestive system: Z87.19

## 2022-05-17 NOTE — Patient Instructions (Signed)
Drink a small (8 oz) glass of Tonic Water before bed for the cramping

## 2022-05-17 NOTE — Progress Notes (Signed)
   Care Guide Note  05/17/2022 Name: Miranda Gaines MRN: [id number] DOB: 1955/09/08  Referred by: Angelica Pou, MD Reason for referral : Care Coordination (Outreach to reschedule initial with Pharm d )   Miranda Gaines is a 67 y.o. year old female who is a primary care patient of Angelica Pou, MD. Miranda Gaines was referred to the pharmacist for assistance related to DM.    An unsuccessful telephone outreach was attempted today to contact the patient who was referred to the pharmacy team for assistance with medication assistance. Additional attempts will be made to contact the patient.   Miranda Gaines, [place], [place] [address] Direct Dial: [phone] [email or web address]

## 2022-05-18 NOTE — Progress Notes (Signed)
  Care Coordination Note  05/18/2022 Name: HINA GUPTA MRN: [id number] DOB: 1955-09-30  LEAHANNA BUSER is a 67 y.o. year old female who is a primary care patient of Angelica Pou, MD and is actively engaged with the Chronic Care Management team. I reached out to [name] by phone today to assist with re-scheduling an initial visit with the Pharmacist  Follow up plan: The care management team will reach out to the patient again over the next 7 days.   Noreene Larsson, [place], [place] [address] Direct Dial: [phone] [email or web address]

## 2022-05-20 NOTE — Progress Notes (Signed)
  Subjective:  Patient ID: Miranda Gaines, female    DOB: 16-Apr-1956,  MRN: [id number]  Chief Complaint  Patient presents with   Numbness    NP-Numbness of toes    67 y.o. female presents with the above complaint. History confirmed with patient.  Sensation is isolated to the third and fourth toes on both feet  Objective:  Physical Exam: warm, good capillary refill, no trophic changes or ulcerative lesions, normal DP and PT pulses, normal sensory exam, and Mulder's click in the third interspace, slight discomfort here on palpation  Assessment:   1. Morton's neuroma of both feet      Plan:  Patient was evaluated and treated and all questions answered.  I do think her pain is consistent with a Morton's neuroma.  She currently does not ambulate due to spinal issues which is likely contributing factor to this neurologic issue.  Thankfully this is not particularly painful yet just because the numbing sensation.  We discussed etiology and treatment options of Morton's neuroma including injection therapy.  She will hold off on this and return to see me as needed if this worsens  No follow-ups on file.

## 2022-05-24 NOTE — Progress Notes (Signed)
   Care Guide Note  05/24/2022 Name: Miranda Gaines MRN: [id number] DOB: [birth year]  Referred by: Angelica Pou, MD Reason for referral : Care Coordination (Outreach to reschedule initial with Pharm d )   Miranda Gaines is a 67 y.o. year old female who is a primary care patient of Angelica Pou, MD. [name] was referred to the pharmacist for assistance related to DM.    A third unsuccessful telephone outreach was attempted today to contact the patient who was referred to the pharmacy team for assistance with medication assistance. The Population Health team is pleased to engage with this patient at any time in the future upon receipt of referral and should he/she be interested in assistance from the [facility] team.   Noreene Larsson, [place], [place] [address] Direct Dial: [phone] [email or web address]

## 2022-07-04 ENCOUNTER — Other Ambulatory Visit: Payer: Self-pay

## 2022-07-04 DIAGNOSIS — E785 Hyperlipidemia, unspecified: Secondary | ICD-10-CM

## 2022-07-04 MED ORDER — ATORVASTATIN CALCIUM 80 MG PO TABS: ORAL_TABLET | ORAL | 3 refills | Status: AC

## 2022-07-13 ENCOUNTER — Encounter: Payer: Medicare Other | Admitting: Internal Medicine

## 2022-08-02 ENCOUNTER — Other Ambulatory Visit: Payer: Self-pay

## 2022-08-02 MED ORDER — AMLODIPINE BESYLATE 10 MG PO TABS: 10.0000 mg | ORAL_TABLET | Freq: Every day | ORAL | 3 refills | Status: DC

## 2022-09-25 ENCOUNTER — Other Ambulatory Visit: Payer: Self-pay

## 2022-09-25 DIAGNOSIS — K219 Gastro-esophageal reflux disease without esophagitis: Secondary | ICD-10-CM

## 2022-09-25 MED ORDER — PANTOPRAZOLE SODIUM 20 MG PO TBEC: 20.0000 mg | DELAYED_RELEASE_TABLET | Freq: Every day | ORAL | 3 refills | Status: DC

## 2022-09-26 ENCOUNTER — Other Ambulatory Visit: Payer: Self-pay

## 2022-09-26 MED ORDER — AMLODIPINE BESYLATE 10 MG PO TABS: 10.0000 mg | ORAL_TABLET | Freq: Every day | ORAL | 3 refills | Status: DC

## 2022-10-19 ENCOUNTER — Other Ambulatory Visit: Payer: Self-pay

## 2022-10-19 DIAGNOSIS — E785 Hyperlipidemia, unspecified: Secondary | ICD-10-CM

## 2022-10-19 MED ORDER — EZETIMIBE 10 MG PO TABS
10.0000 mg | ORAL_TABLET | Freq: Every day | ORAL | 3 refills | Status: DC
Start: 2022-10-19 — End: 2023-07-06

## 2023-01-03 ENCOUNTER — Ambulatory Visit (INDEPENDENT_AMBULATORY_CARE_PROVIDER_SITE_OTHER): Payer: 59

## 2023-01-03 VITALS — Ht 62.0 in | Wt 162.0 lb

## 2023-01-03 DIAGNOSIS — Z0000 Encounter for general adult medical examination without abnormal findings: Secondary | ICD-10-CM | POA: Diagnosis not present

## 2023-01-03 NOTE — Patient Instructions (Signed)
Miranda Gaines , Thank you for taking time to come for your Medicare Wellness Visit. I appreciate your ongoing commitment to your health goals. Please review the following plan we discussed and let me know if I can assist you in the future.   Referrals/Orders/Follow-Ups/Clinician Recommendations: You are due for Tetanus, Flu, Shingles and Pneumonia vaccines.  You can get these done (1 at a time) at your local pharmacy.  Remember to call [facility], [phone] to get scheduled for a Bone Density screening.  Also be on the look out for the Cologuard kit coming soon to your place.  This is a list of the screening recommended for you and due dates:  Health Maintenance  Topic Date Due   DTaP/Tdap/Td vaccine (1 - Tdap) Never done   Zoster (Shingles) Vaccine (1 of 2) Never done   DEXA scan (bone density measurement)  Never done   Pneumonia Vaccine (3 of 3 - PPSV23 or PCV20) 02/09/2020   Colon Cancer Screening  08/30/2022   Flu Shot  11/23/2022   COVID-19 Vaccine (3 - 2023-24 season) 12/24/2022   Mammogram  09/16/2023   Medicare Annual Wellness Visit  01/03/2024   Hepatitis C Screening  Completed   HPV Vaccine  Aged Out    Advanced directives: (Copy Requested) Please bring a copy of your health care power of attorney and living will to the office to be added to your chart at your convenience.  Next Medicare Annual Wellness Visit scheduled for next year: Yes

## 2023-01-03 NOTE — Progress Notes (Signed)
Subjective:   Miranda Gaines is a 67 y.o. female who presents for an Initial Medicare Annual Wellness Visit.  Visit Complete: Virtual  I connected with  Miranda Gaines on 01/03/23 by a audio enabled telemedicine application and verified that I am speaking with the correct person using two identifiers.  Patient Location: Home  Provider Location: Home Office  I discussed the limitations of evaluation and management by telemedicine. The patient expressed understanding and agreed to proceed.  Vital Signs: Because this visit was a virtual/telehealth visit, some criteria may be missing or patient reported. Any vitals not documented were not able to be obtained and vitals that have been documented are patient reported.    Review of Systems    Cardiac Risk Factors include: advanced age (>24men, >28 women);hypertension;dyslipidemia     Objective:    Today's Vitals   01/03/23 1347 01/03/23 1348  Weight: 162 lb (73.5 kg)   Height: 5\' 2"  (1.575 m)   PainSc:  8    Body mass index is 29.63 kg/m.     01/03/2023    1:55 PM 05/04/2022    8:44 AM 09/29/2021   10:46 AM 12/16/2020    8:46 AM 09/30/2019    2:59 PM 03/18/2019   10:25 AM 01/03/2018    8:57 AM  Advanced Directives  Does Patient Have a Medical Advance Directive? Yes No No No No No No  Type of Estate agent of [place];Living will        Copy of Healthcare Power of Attorney in Chart? No - copy requested        Would patient like information on creating a medical advance directive?  No - Patient declined No - Patient declined No - Patient declined No - Patient declined No - Patient declined No - Patient declined    Current Medications (verified) Outpatient Encounter Medications as of 01/03/2023  Medication Sig   amLODipine (NORVASC) 10 MG tablet Take 1 tablet (10 mg total) by mouth daily.   atorvastatin (LIPITOR) 80 MG tablet TAKE 1 TABLET BY MOUTH ONCE DAILY AT 6 PM   cyclobenzaprine (FLEXERIL) 10 MG  tablet Take by mouth.   ezetimibe (ZETIA) 10 MG tablet Take 1 tablet (10 mg total) by mouth daily.   fluticasone (FLONASE) 50 MCG/ACT nasal spray INSTILL 1 SPRAY IN EACH NOSTRIL DAILY   hydrOXYzine (ATARAX) 10 MG tablet Take by mouth.   lisinopril (ZESTRIL) 40 MG tablet Take one tablet every day for high blood pressure.   loratadine (CLARITIN) 10 MG tablet Take 1 tablet by mouth daily.   nortriptyline (PAMELOR) 75 MG capsule Take 75 mg by mouth at bedtime.   pantoprazole (PROTONIX) 20 MG tablet Take 1 tablet (20 mg total) by mouth daily.   traZODone (DESYREL) 50 MG tablet Take 50 mg by mouth at bedtime. Prescribed by Mental Health.    No facility-administered encounter medications on file as of 01/03/2023.    Allergies (verified) Codeine, Ibuprofen, Lyrica [pregabalin], Maxzide [hydrochlorothiazide w-triamterene], Morphine, Oxycodone hcl, and Penicillins   History: Past Medical History:  Diagnosis Date   Constipation 06/24/2015   Degenerative disc disease, cervical    L spine 10/09 showing DJD and cervical spurring but no specific foraminal narrowing. Has been followed by Rochele Pages, pain management physician in [place].   Depression    [facility]   GERD (gastroesophageal reflux disease)    Hearing impairment; bilateral hearing aides 09/30/2021   Herniated disc    L5-S1. s/p laminectomy in 1993  by Dr. Shelle Iron.  MRI June 2005 showing L4/5 disc bulge + mild facet arthropathy. F/b Dr. Marzetta Board for pain management. Wheel chair bound.   Hyperlipidemia    Hypertension    Impaired physical mobility 11/03/2016   Pyloric stenosis    s/p EGD with balloon dilatation on 06/23/2007 by Dr. Elnoria Howard   Recurrent otitis externa of right ear 06/20/2006   Qualifier: Diagnosis of  By: Reynold Bowen MD, Mariane     Seborrheic dermatitis    of face, sees Dr. Horton Chin, dermatop cream TID   Tobacco abuse    Did not tolerate Chantix (vomiting)   Past Surgical History:  Procedure Laterality Date   EGD w.  balllon dilation for pyloric stenosis  04/25/2007   Dr. Elnoria Howard   LUMBAR LAMINECTOMY  04/25/1991   Dr. Leandra Kern   LUMBAR LAMINECTOMY     Family History  Problem Relation Age of Onset   Coronary artery disease Mother        deceased at 75   Cancer Father        deceased at 5, lung cancer   Social History   Socioeconomic History   Marital status: Legally Separated    Spouse name: Not on file   Number of children: 0   Years of education: Not on file   Highest education level: Not on file  Occupational History   Occupation: diabled  Tobacco Use   Smoking status: Former    Current packs/day: 0.00    Types: Cigarettes    Quit date: 02/10/2014    Years since quitting: 8.9   Smokeless tobacco: Former   Tobacco comments:    quit at 2012  Vaping Use   Vaping status: Never Used  Substance and Sexual Activity   Alcohol use: No    Alcohol/week: 0.0 standard drinks of alcohol   Drug use: No   Sexual activity: Not Currently  Other Topics Concern   Not on file  Social History Narrative   Divorced, no children.   Wheelchair bound from chronic back pain 2/2 workplace injury in her 67s. She used to be a Psychologist, occupational.   Lives alone in a house.   No alcohol or drug use.   Menopause in 2001   Last sexual activity in 2001   Social Determinants of Health   Financial Resource Strain: Not on file  Food Insecurity: Not on file  Transportation Needs: No Transportation Needs (11/25/2021)   PRAPARE - Administrator, Civil Service (Medical): No    Lack of Transportation (Non-Medical): No  Physical Activity: Not on file  Stress: Not on file  Social Connections: Not on file    Tobacco Counseling Counseling given: Not Answered Tobacco comments: quit at 2012   Clinical Intake:  Pre-visit preparation completed: Yes  Pain : 0-10 Pain Score: 8  Pain Type: Acute pain Pain Location: Back (tail bone) Pain Orientation: Left Pain Radiating Towards: lt leg Pain Onset: More than  a month ago Pain Frequency: Constant Pain Relieving Factors: Muscle relaxers  Pain Relieving Factors: Muscle relaxers  BMI - recorded: 29.63 Nutritional Status: BMI 25 -29 Overweight Nutritional Risks: None Diabetes: No  How often do you need to have someone help you when you read instructions, pamphlets, or other written materials from your doctor or pharmacy?: 1 - Never  Interpreter Needed?: No  Information entered by :: Najmo Pardue, RMA   Activities of Daily Living    01/03/2023    1:50 PM 05/04/2022  8:44 AM  In your present state of health, do you have any difficulty performing the following activities:  Hearing? 1 0  Comment wears hearing aides   Vision? 0 0  Difficulty concentrating or making decisions? 0 0  Walking or climbing stairs? 1 1  Dressing or bathing? 0 0  Doing errands, shopping? 0 0  Comment uses the [employer] bus and Medicaid transportation   Preparing Food and eating ? N   Using the Toilet? N   In the past six months, have you accidently leaked urine? N   Comment she does wear depends   Do you have problems with loss of bowel control? N   Managing your Medications? N   Managing your Finances? N     Patient Care Team: Miguel Aschoff, MD as PCP - General (Internal Medicine)  Indicate any recent Medical Services you may have received from other than [facility] providers in the past year (date may be approximate).     Assessment:   This is a routine wellness examination for [place].  Hearing/Vision screen Hearing Screening - Comments:: Wears hearing aides. Vision Screening - Comments:: Denies vision issues   Goals Addressed               This Visit's Progress     Increase physical activity (pt-stated)   Not on track     Depression Screen    05/04/2022   10:40 AM 09/29/2021   11:28 AM 12/16/2020    9:46 AM 03/18/2019   10:29 AM 10/02/2017    9:11 AM 06/05/2017    8:56 AM 03/02/2017    9:26 AM  PHQ 2/9 Scores  PHQ - 2 Score 0 0 3 0 0 0  0  PHQ- 9 Score   16        Fall Risk    01/03/2023    1:56 PM 05/04/2022    8:45 AM 09/29/2021   11:20 AM 12/16/2020    8:47 AM 03/18/2019   10:24 AM  Fall Risk   Falls in the past year? 0 0 1 0 0  Number falls in past yr: 0 0 0 0   Injury with Fall? 0 0 1 0   Risk for fall due to : No Fall Risks Impaired balance/gait Impaired balance/gait  Impaired balance/gait;Impaired mobility  Follow up Falls evaluation completed;Falls prevention discussed Falls evaluation completed Falls evaluation completed  Falls prevention discussed    MEDICARE RISK AT HOME: Medicare Risk at Home Any stairs in or around the home?: No Home free of loose throw rugs in walkways, pet beds, electrical cords, etc?: Yes Adequate lighting in your home to reduce risk of falls?: Yes Life alert?: No Use of a cane, walker or w/c?: Yes (powerwheel chair) Grab bars in the bathroom?: Yes Shower chair or bench in shower?: Yes Elevated toilet seat or a handicapped toilet?: Yes  TIMED UP AND GO:  Was the test performed? No    Cognitive Function:        01/03/2023    1:58 PM  6CIT Screen  What Year? 0 points  What month? 0 points    Immunizations Immunization History  Administered Date(s) Administered   Fluad Quad(high Dose 65+) 02/01/2021, 01/18/2022   Influenza Split 02/02/2011, 02/06/2012   Influenza Whole 02/20/2005, 01/31/2007, 01/31/2008, 01/27/2009, 01/31/2010   Influenza,inj,Quad PF,6+ Mos 12/24/2012, 01/13/2014, 02/09/2015, 12/28/2015, 02/02/2017, 01/03/2018, 01/29/2019, 01/06/2020   PFIZER(Purple Top)SARS-COV-2 Vaccination 09/11/2019, 10/06/2019   Pneumococcal Conjugate-13 02/09/2015   Pneumococcal Polysaccharide-23 02/17/2005  TDAP status: Due, Education has been provided regarding the importance of this vaccine. Advised may receive this vaccine at local pharmacy or Health Dept. Aware to provide a copy of the vaccination record if obtained from local pharmacy or Health Dept. Verbalized acceptance  and understanding.  Flu Vaccine status: Due, Education has been provided regarding the importance of this vaccine. Advised may receive this vaccine at local pharmacy or Health Dept. Aware to provide a copy of the vaccination record if obtained from local pharmacy or Health Dept. Verbalized acceptance and understanding.  Pneumococcal vaccine status: Due, Education has been provided regarding the importance of this vaccine. Advised may receive this vaccine at local pharmacy or Health Dept. Aware to provide a copy of the vaccination record if obtained from local pharmacy or Health Dept. Verbalized acceptance and understanding.  Covid-19 vaccine status: Information provided on how to obtain vaccines.   Qualifies for Shingles Vaccine? Yes   Zostavax completed No   Shingrix Completed?: No.    Education has been provided regarding the importance of this vaccine. Patient has been advised to call insurance company to determine out of pocket expense if they have not yet received this vaccine. Advised may also receive vaccine at local pharmacy or Health Dept. Verbalized acceptance and understanding.  Screening Tests Health Maintenance  Topic Date Due   DTaP/Tdap/Td (1 - Tdap) Never done   Zoster Vaccines- Shingrix (1 of 2) Never done   DEXA SCAN  Never done   Pneumonia Vaccine 69+ Years old (3 of 3 - PPSV23 or PCV20) 02/09/2020   Colonoscopy  08/30/2022   INFLUENZA VACCINE  11/23/2022   COVID-19 Vaccine (3 - 2023-24 season) 12/24/2022   MAMMOGRAM  09/16/2023   Medicare Annual Wellness (AWV)  01/03/2024   Hepatitis C Screening  Completed   HPV VACCINES  Aged Out    Health Maintenance  Health Maintenance Due  Topic Date Due   DTaP/Tdap/Td (1 - Tdap) Never done   Zoster Vaccines- Shingrix (1 of 2) Never done   DEXA SCAN  Never done   Pneumonia Vaccine 4+ Years old (3 of 3 - PPSV23 or PCV20) 02/09/2020   Colonoscopy  08/30/2022   INFLUENZA VACCINE  11/23/2022   COVID-19 Vaccine (3 - 2023-24  season) 12/24/2022    Colorectal cancer screening: Referral to GI placed 01/03/23 (Cologuard). Pt aware the office will call re: appt.  Mammogram status: Completed 09/15/2021. Repeat every year 2  Bone Density status: Ordered 9/. Pt provided with contact info and advised to call to schedule appt.  Lung Cancer Screening: (Low Dose CT Chest recommended if Age 44-80 years, 20 pack-year currently smoking OR have quit w/in 15years.) does not qualify.   Lung Cancer Screening Referral: N/A  Additional Screening:  Hepatitis C Screening: does qualify; Completed 06/24/2015  Vision Screening: Recommended annual ophthalmology exams for early detection of glaucoma and other disorders of the eye. Is the patient up to date with their annual eye exam?  No  Who is the provider or what is the name of the office in which the patient attends annual eye exams? N/A If pt is not established with a provider, would they like to be referred to a provider to establish care? Yes .   Dental Screening: Recommended annual dental exams for proper oral hygiene   Community Resource Referral / Chronic Care Management: CRR required this visit?  No   CCM required this visit?  No     Plan:     I have personally  reviewed and noted the following in the patient's chart:   Medical and social history Use of alcohol, tobacco or illicit drugs  Current medications and supplements including opioid prescriptions. Patient is not currently taking opioid prescriptions. Functional ability and status Nutritional status Physical activity Advanced directives List of other physicians Hospitalizations, surgeries, and ER visits in previous 12 months Vitals Screenings to include cognitive, depression, and falls Referrals and appointments  In addition, I have reviewed and discussed with patient certain preventive protocols, quality metrics, and best practice recommendations. A written personalized care plan for preventive  services as well as general preventive health recommendations were provided to patient.     Baylin Gamblin L Barrie Wale, CMA   01/03/2023   After Visit Summary: (Mail) Due to this being a telephonic visit, the after visit summary with patients personalized plan was offered to patient via mail   Nurse Notes: Patient is due for a Tdap, Pneumonia 65+, Shingrix and Flu vaccines.  She is also due for a colonoscopy, however, she would like to try the Cologuard instead.  Patient also needs a referral for a DEXA screening this year.  Patient explains that she has not had an yearly eye exam in years due to transportation.  I will send a TE to PCP for these screenings to be ordered per PCP.  Patient had no other concerns to address today.

## 2023-01-26 DIAGNOSIS — M961 Postlaminectomy syndrome, not elsewhere classified: Secondary | ICD-10-CM | POA: Diagnosis not present

## 2023-01-26 DIAGNOSIS — M5416 Radiculopathy, lumbar region: Secondary | ICD-10-CM | POA: Diagnosis not present

## 2023-02-02 ENCOUNTER — Ambulatory Visit: Payer: 59 | Admitting: *Deleted

## 2023-02-02 DIAGNOSIS — Z23 Encounter for immunization: Secondary | ICD-10-CM | POA: Diagnosis not present

## 2023-02-09 DIAGNOSIS — F5101 Primary insomnia: Secondary | ICD-10-CM | POA: Diagnosis not present

## 2023-06-20 ENCOUNTER — Ambulatory Visit: Payer: 59 | Admitting: Student

## 2023-06-20 VITALS — BP 143/79 | HR 74 | Temp 98.4°F | Ht 62.0 in

## 2023-06-20 DIAGNOSIS — Z7409 Other reduced mobility: Secondary | ICD-10-CM

## 2023-06-20 DIAGNOSIS — E785 Hyperlipidemia, unspecified: Secondary | ICD-10-CM | POA: Diagnosis not present

## 2023-06-20 DIAGNOSIS — R7303 Prediabetes: Secondary | ICD-10-CM

## 2023-06-20 DIAGNOSIS — M47817 Spondylosis without myelopathy or radiculopathy, lumbosacral region: Secondary | ICD-10-CM

## 2023-06-20 DIAGNOSIS — M5416 Radiculopathy, lumbar region: Secondary | ICD-10-CM | POA: Diagnosis not present

## 2023-06-20 DIAGNOSIS — Z23 Encounter for immunization: Secondary | ICD-10-CM | POA: Diagnosis not present

## 2023-06-20 DIAGNOSIS — Z0000 Encounter for general adult medical examination without abnormal findings: Secondary | ICD-10-CM

## 2023-06-20 DIAGNOSIS — I10 Essential (primary) hypertension: Secondary | ICD-10-CM | POA: Diagnosis not present

## 2023-06-20 NOTE — Progress Notes (Unsigned)
 CC: Follow-up  HPI:  [name] is a 68 y.o. female living with a history stated below and presents today for follow-up. Please see problem based assessment and plan for additional details.  Past Medical History:  Diagnosis Date   Constipation 06/24/2015   Degenerative disc disease, cervical    L spine 10/09 showing DJD and cervical spurring but no specific foraminal narrowing. Has been followed by Miranda Gaines, pain management physician in [place].   Depression    [facility]   GERD (gastroesophageal reflux disease)    Hearing impairment; bilateral hearing aides 09/30/2021   Herniated disc    L5-S1. s/p laminectomy in 1993 by Dr. Shelle Gaines.  MRI June 2005 showing L4/5 disc bulge + mild facet arthropathy. F/b Miranda Gaines for pain management. Wheel chair bound.   Hyperlipidemia    Hypertension    Impaired physical mobility 11/03/2016   Pyloric stenosis    s/p EGD with balloon dilatation on 06/23/2007 by Dr. Elnoria Gaines   Recurrent otitis externa of right ear 06/20/2006   Qualifier: Diagnosis of  By: Miranda Bowen MD, Mariane     Seborrheic dermatitis    of face, sees Dr. Horton Gaines, dermatop cream TID   Tobacco abuse    Did not tolerate Chantix (vomiting)    Current Outpatient Medications on File Prior to Visit  Medication Sig Dispense Refill   amLODipine (NORVASC) 10 MG tablet Take 1 tablet (10 mg total) by mouth daily. 90 tablet 3   atorvastatin (LIPITOR) 80 MG tablet TAKE 1 TABLET BY MOUTH ONCE DAILY AT 6 PM 90 tablet 3   ezetimibe (ZETIA) 10 MG tablet Take 1 tablet (10 mg total) by mouth daily. 90 tablet 3   fluticasone (FLONASE) 50 MCG/ACT nasal spray INSTILL 1 SPRAY IN EACH NOSTRIL DAILY 16 mL 3   hydrOXYzine (ATARAX) 10 MG tablet Take by mouth.     lisinopril (ZESTRIL) 40 MG tablet Take one tablet every day for high blood pressure. 90 tablet 3   loratadine (CLARITIN) 10 MG tablet Take 1 tablet by mouth daily.     nortriptyline (PAMELOR) 75 MG capsule Take 75 mg by mouth at  bedtime.     pantoprazole (PROTONIX) 20 MG tablet Take 1 tablet (20 mg total) by mouth daily. 90 tablet 3   traZODone (DESYREL) 50 MG tablet Take 50 mg by mouth at bedtime. Prescribed by [facility].      No current facility-administered medications on file prior to visit.    Family History  Problem Relation Age of Onset   Coronary artery disease Mother        deceased at 7   Cancer Father        deceased at 75, lung cancer    Social History   Socioeconomic History   Marital status: Legally Separated    Spouse name: Not on file   Number of children: 0   Years of education: Not on file   Highest education level: Not on file  Occupational History   Occupation: diabled  Tobacco Use   Smoking status: Former    Current packs/day: 0.00    Types: Cigarettes    Quit date: 02/10/2014    Years since quitting: 9.3   Smokeless tobacco: Former   Tobacco comments:    quit at 2012  Vaping Use   Vaping status: Never Used  Substance and Sexual Activity   Alcohol use: No    Alcohol/week: 0.0 standard drinks of alcohol   Drug use: No  Sexual activity: Not Currently  Other Topics Concern   Not on file  Social History Narrative   Divorced, no children.   Wheelchair bound from chronic back pain 2/2 workplace injury in her 60s. She used to be a Psychologist, occupational.   Lives alone in a house.   No alcohol or drug use.   Menopause in 2001   Last sexual activity in 2001   Social Drivers of Health   Financial Resource Strain: High Risk (01/03/2023)   Overall Financial Resource Strain (CARDIA)    Difficulty of Paying Living Expenses: Hard  Food Insecurity: No Food Insecurity (01/03/2023)   Hunger Vital Sign    Worried About Running Out of Food in the Last Year: Never true    Ran Out of Food in the Last Year: Never true  Transportation Needs: Unmet Transportation Needs (01/03/2023)   PRAPARE - Transportation    Lack of Transportation (Medical): No    Lack of Transportation (Non-Medical): Yes   Physical Activity: Insufficiently Active (01/03/2023)   Exercise Vital Sign    Days of Exercise per Week: 2 days    Minutes of Exercise per Session: 10 min  Stress: Stress Concern Present (01/03/2023)   Miranda Gaines [employer] - Occupational Stress Questionnaire    Feeling of Stress : Very much  Social Connections: Moderately Isolated (01/03/2023)   Social Connection and Isolation Panel [NHANES]    Frequency of Communication with Friends and Family: More than three times a week    Frequency of Social Gatherings with Friends and Family: Never    Attends Religious Services: More than 4 times per year    Active Member of [employer] or Organizations: No    Attends Banker Meetings: Never    Marital Status: Separated  Intimate Partner Violence: Patient Unable To Answer (01/03/2023)   Humiliation, Afraid, Rape, and Kick questionnaire    Fear of Current or Ex-Partner: Patient unable to answer    Emotionally Abused: Patient unable to answer    Physically Abused: Patient unable to answer    Sexually Abused: Patient unable to answer    Review of Systems: ROS negative except for what is noted on the assessment and plan.  Vitals:   06/20/23 1401 06/20/23 1427  BP: (!) 147/77 (!) 143/79  Pulse: 87 74  Temp: 98.4 F (36.9 C)   TempSrc: Oral   SpO2: 100%   Height: 5\' 2"  (1.575 m)     Physical Exam: Constitutional: well-appearing, sitting in chair, in no acute distress Cardiovascular: regular rate and rhythm, no m/r/g Pulmonary/Chest: normal work of breathing on room air, lungs clear to auscultation bilaterally Abdominal: soft, non-tender, non-distended MSK: normal bulk and tone Skin: warm and dry Psych: normal mood and behavior  Assessment & Plan:     Patient {GC/[id number]::"discussed with","seen with"} Dr. {[id number]::"[id number]","G. Hoffman","Mullen","Narendra","Vincent","Guilloud","Lau","Machen"}  No problem-specific Assessment & Plan notes found for  this encounter.   Carmina Miller, D.O. [facility] Internal Medicine, PGY-1 Phone: [phone] Date 06/20/2023 Time 2:29 PM  Hypertension Uncontrolled on Lisinopril 40 mg daily and amlodipine 10 mg daily, if she is actually taking these medications.  She did not bring meds with her.  Refill histories are suspect.  Will invite [facility] pharmacist to help review.   Hearing impairment; bilateral hearing aides Awaiting new hearing aids, which shall arrive soon.  She is certainly hearing impaired during conversation, leans in and turns her right ear toward speaker.   Facial rash Today Ms. Hinde was describing painful bumps erupting across her  nasal bridge and upper cheeks which indicate to her that she is constipated.  She endorses resolution of these bumps as soon as her bowels move.  Ms. Lockyer has interesting interpretations of her bodily sensations and investigation of symptoms is challenging.  No lesions visible today.   Overweight (BMI 25.0-29.9) Weight remains stable.  Monitor.   Hyperlipidemia Lipid panel 09/2021: TC 227, LDL 172, HDL 39, TG 88.  She endorses taking all of her medicines including atorvastatin 80 mg and ezetimibe 10 mg daily, though Zetia refills have not been consistent.  Markedly elevated lipids despite treatment-I will reach out to [facility]  Pharmacist for assistance with management.   Prediabetes New diagnosis, A1c 5.9 today.  She is concerned, as no diabetes runs in her family and she feels that she is following a healthy diet.  She was reassured.  Will continue to keep an eye on this.   Drug induced constipation Symptoms continue, which she manages with prune juice effectively.  She did not begin Metamucil discussed at last visit.  She feels a number of unusual sensations when she feels her bowels need to move-including headache and a breakout across the bridge of her nose and cheeks.  We discussed the role of her medications and constipation again.  These are prescribed by  her [facility] provider.   Depression PHQ 2 is 0 today, she continues on nortriptyline, hydroxyzine, and trazodone prescribed at [facility] which she reports attending every 3 months.  She declines referral to our new behavioral health clinician Ms. Ashe as her symptoms are well-controlled at this time.   GERD without esophagitis Symptoms continue unchanged, exacerbated by eating meat, fatty foods, and crunchy foods (chips) which she tries to avoid.  She feels like her proton pump inhibitor is managing this to her satisfaction and declines increasing dose..   Numbness of toes Approximately 1 year duration of numbness of bilateral third and fourth toes "feels like they're going dead', unassociated with pain, worsened by cold (wears sock to bed, even in summer). Feet have plantar flexion cramping when cold as well.  Sensation is intact with monofilament testing, though she feels an abnormality/dysesthesia with gross touch.  Color of the toes is normal, no swelling.  No discomfort in the digit joints.  When her foot is compressed laterally, she does not feel pain but instead feels as though her feet will cramp/spasm.  DP pulses are 1+ bilaterally, and screening ABIs in the office are normal.  While it would be unusual to have Morton's neuroma without the characteristic pain, this is on my differential and I will refer her to a podiatrist for opinion.  Ms. Rotert will not submit to EMG/NCS's as she had a miserable experience with this in the past when investigating radiculopathy.  Of note, the distribution of her numbness/dysesthesia is not a radicular pattern.   Tender vein Ms. Asper points out her right cephalic vein, which is slightly more prominent than her left, and which has been tender.  There is no erythema to suggest phlebitis, and the vein is not thrombosed.  I cannot feel adenopathy in the elbow nor at her shoulder, and there is no JVD on the right.  Holding her arm upright over her head drains the arm  and diminishes her vein which she finds reassuring.  She has no concept of what veins and arteries are.  We did some basic teaching and I asked her to let us know if this problem worsens.

## 2023-06-21 ENCOUNTER — Telehealth: Payer: Self-pay | Admitting: Internal Medicine

## 2023-06-21 LAB — BASIC METABOLIC PANEL
BUN/Creatinine Ratio: 13 (ref 12–28)
BUN: 10 mg/dL (ref 8–27)
CO2: 22 mmol/L (ref 20–29)
Calcium: 9.6 mg/dL (ref 8.7–10.3)
Chloride: 107 mmol/L — ABNORMAL HIGH (ref 96–106)
Creatinine, Ser: 0.78 mg/dL (ref 0.57–1.00)
Glucose: 80 mg/dL (ref 70–99)
Potassium: 4.2 mmol/L (ref 3.5–5.2)
Sodium: 142 mmol/L (ref 134–144)
eGFR: 83 mL/min/{1.73_m2} (ref >59)

## 2023-06-21 LAB — LIPID PANEL
Chol/HDL Ratio: 7.1 ratio — ABNORMAL HIGH (ref 0.0–4.4)
Cholesterol, Total: 247 mg/dL — ABNORMAL HIGH (ref 100–199)
HDL: 35 mg/dL — ABNORMAL LOW (ref >39)
LDL Chol Calc (NIH): 187 mg/dL — ABNORMAL HIGH (ref 0–99)
Triglycerides: 135 mg/dL (ref 0–149)
VLDL Cholesterol Cal: 25 mg/dL (ref 5–40)

## 2023-06-21 LAB — HEMOGLOBIN A1C
Est. average glucose Bld gHb Est-mCnc: 120 mg/dL
Hgb A1c MFr Bld: 5.8 % — ABNORMAL HIGH (ref 4.8–5.6)

## 2023-06-21 NOTE — Assessment & Plan Note (Signed)
 LDL was 172 in 04/2022.  Managed with Lipitor 80 and Zetia 10.  Dispense history does not reflect compliance with Lipitor but she uses mail delivery pharmacy so unclear if dispense history would be accurate.  She states she takes both daily.  Repeat lipid panel today with goal for primary prevention < 100.

## 2023-06-21 NOTE — Assessment & Plan Note (Addendum)
 A1c was 5.9 04/2022.  Diet controlled.  Denies polydipsia/polyuria. -Repeat A1c today -BMP

## 2023-06-21 NOTE — Assessment & Plan Note (Signed)
 Has been unable to walk since 2001.  Has been using motorized wheelchairs but needs referral for another. -Neuro rehab referral for management with this

## 2023-06-21 NOTE — Assessment & Plan Note (Signed)
 Colonoscopy referral and Prevnar today.

## 2023-06-21 NOTE — Telephone Encounter (Signed)
 Unable to sch an appointment with [facility] as the patient is now overdue.  Last MAMMO on file:  MM 3D SCREEN BREAST BILATERAL (Accession [id number]) (Order [id number]) Imaging Date: 09/15/2021 Department: [facility] Imaging Released By: Rodney Booze Authorizing: Miguel Aschoff, MD    Can a new order be placed and the patient will be contacted and sch?

## 2023-06-21 NOTE — Assessment & Plan Note (Signed)
 Managed with lisinopril 40 mg/daily and amlodipine 10 mg/daily.  Amlodipine dispense history reflects compliance but no dispense history information for lisinopril.  She states she takes both daily and had taken these today.  She will obtain blood pressure cuff and start ambulatory monitoring.

## 2023-06-22 ENCOUNTER — Other Ambulatory Visit: Payer: Self-pay

## 2023-06-22 MED ORDER — AMLODIPINE BESYLATE 10 MG PO TABS: 10.0000 mg | ORAL_TABLET | Freq: Every day | ORAL | 3 refills | Status: DC

## 2023-06-24 NOTE — Progress Notes (Signed)
 Internal Medicine Clinic Attending  Case discussed with the resident at the time of the visit.  We reviewed the resident's history and exam and pertinent patient test results.  I agree with the assessment, diagnosis, and plan of care documented in the resident's note.

## 2023-06-24 NOTE — Addendum Note (Signed)
 Addended by: Carlynn Purl C on: 06/24/2023 11:51 AM   Modules accepted: Level of Service

## 2023-06-25 ENCOUNTER — Encounter: Payer: 59 | Admitting: Student

## 2023-06-25 ENCOUNTER — Other Ambulatory Visit: Payer: Self-pay | Admitting: Internal Medicine

## 2023-06-25 DIAGNOSIS — Z1231 Encounter for screening mammogram for malignant neoplasm of breast: Secondary | ICD-10-CM

## 2023-07-03 ENCOUNTER — Telehealth: Payer: Self-pay | Admitting: *Deleted

## 2023-07-03 ENCOUNTER — Encounter: Payer: Self-pay | Admitting: Physical Therapy

## 2023-07-03 ENCOUNTER — Ambulatory Visit: Attending: Internal Medicine | Admitting: Physical Therapy

## 2023-07-03 DIAGNOSIS — M6281 Muscle weakness (generalized): Secondary | ICD-10-CM | POA: Diagnosis not present

## 2023-07-03 DIAGNOSIS — R2689 Other abnormalities of gait and mobility: Secondary | ICD-10-CM | POA: Diagnosis not present

## 2023-07-03 DIAGNOSIS — M5459 Other low back pain: Secondary | ICD-10-CM | POA: Diagnosis not present

## 2023-07-03 NOTE — Telephone Encounter (Addendum)
 Patient called she is returning your call she stated her last mammogram was in 2022 with [facility] [address], patient stated she would like to be scheduled around June/July due to limited transportation she is waiting to receive her new power wheel chair. She prefers a morning appointment. No hx of breast cancer, no problems with her breast and she is not pregnant. Patient stated her appointment information can be mailed to her address on file.

## 2023-07-03 NOTE — Therapy (Signed)
 OUTPATIENT PHYSICAL THERAPY WHEELCHAIR EVALUATION   Patient Name: Miranda Gaines MRN: [id number] DOB:Aug 19, 1955, 68 y.o., female Today's Date: 07/03/2023  END OF SESSION:  PT End of Session - 07/03/23 0936     Visit Number 1    Number of Visits 1    Authorization Type [employer]    PT Start Time 0800    PT Stop Time 0850    PT Time Calculation (min) 50 min    Equipment Utilized During Treatment Gait belt    Activity Tolerance Patient tolerated treatment well    Behavior During Therapy WFL for tasks assessed/performed             Past Medical History:  Diagnosis Date   Constipation 06/24/2015   Degenerative disc disease, cervical    L spine 10/09 showing DJD and cervical spurring but no specific foraminal narrowing. Has been followed by Rochele Pages, pain management physician in [place].   Depression    [facility]   GERD (gastroesophageal reflux disease)    Hearing impairment; bilateral hearing aides 09/30/2021   Herniated disc    L5-S1. s/p laminectomy in 1993 by Dr. Shelle Iron.  MRI June 2005 showing L4/5 disc bulge + mild facet arthropathy. F/b Dr. Marzetta Board for pain management. Wheel chair bound.   Hyperlipidemia    Hypertension    Impaired physical mobility 11/03/2016   Pyloric stenosis    s/p EGD with balloon dilatation on 06/23/2007 by Dr. Elnoria Howard   Recurrent otitis externa of right ear 06/20/2006   Qualifier: Diagnosis of  By: Reynold Bowen MD, Mariane     Seborrheic dermatitis    of face, sees Dr. Horton Chin, dermatop cream TID   Tobacco abuse    Did not tolerate Chantix (vomiting)   Past Surgical History:  Procedure Laterality Date   EGD w. balllon dilation for pyloric stenosis  04/25/2007   Dr. Elnoria Howard   LUMBAR LAMINECTOMY  04/25/1991   Dr. Leandra Kern   LUMBAR LAMINECTOMY     Patient Active Problem List   Diagnosis Date Noted   Pyloric stenosis 05/17/2022   Prediabetes 05/05/2022   Numbness of toes 05/05/2022   Tender vein 05/05/2022   Hearing  impairment; bilateral hearing aides 09/30/2021   Facial rash 12/17/2020   Eczema 06/05/2017   Impaired physical mobility 11/03/2016   Overweight (BMI 25.0-29.9) 07/01/2015   Drug induced constipation 06/24/2015   Cervical radiculopathy 06/19/2014   Lumbar radiculopathy 05/01/2013   Lumbosacral spondylosis 05/01/2013   Healthcare maintenance 05/28/2012   History of excessive cerumen 11/06/2011   DDD (degenerative disc disease), lumbosacral 05/04/2011   Chronic sinusitis 04/26/2011   Recurrent otitis externa of right ear 06/20/2006   Hyperlipidemia 05/04/2006   Depression 05/04/2006   Hypertension 05/04/2006   GERD without esophagitis 05/04/2006   Chronic low back pain 05/04/2006    PCP: Miguel Aschoff, MD  REFERRING PROVIDER: Gust Rung, DO  REFERRING DIAG: Lumbar radiculopathy:  Spondylosis of lumbosacral region  THERAPY DIAG:  Other abnormalities of gait and mobility  Other low back pain  Muscle weakness (generalized)  ONSET DATE: 2005  Rationale for Evaluation and Treatment: Habilitation  SUBJECTIVE:  SUBJECTIVE STATEMENT: Pt presents to PT w/c evaluation in a transport wheelchair which she states she borrowed as her power wheelchair is currently broken down  PERTINENT HISTORY:  See above PAIN:  Are you having pain? Yes: NPRS scale: 9/10 Pain location: lower back and radiating down left leg Pain description: sharp, radiating Aggravating factors: increased activity  Relieving factors: pain medication helps some   PRECAUTIONS: Fall  WEIGHT BEARING RESTRICTIONS: No  FALLS:  Has patient fallen in last 6 months? No  LIVING ENVIRONMENT: Lives with: lives alone Lives in: House/apartment Has following equipment at home: Wheelchair (power), Tour manager, and Grab bars -  her current power wheelchair is in disrepair  OCCUPATION: on disability  PLOF: Independent with basic ADLs, Independent with household mobility with device, and Needs assistance with transfers for safety  PATIENT GOALS: obtain new power wheelchair   PATIENT INFORMATION: This Evaluation form will serve as the LMN for the following suppliers:  Supplier:  [employer] Contact Person:  Joseph Pierini Phone:  [phone]   Reason for Referral: Patient/caregiver Goals: Patient was seen for face-to-face evaluation for   new power wheelchair.  Also present was [employer] with [employer] to discuss recommendations and wheelchair options.  Further paperwork was completed and sent to vendor.  Patient appears to qualify for power mobility device at this time per objective findings.   MEDICAL HISTORY: Diagnosis:  Lumbar radiculopathy:  Spondylosis of lumbosacral region: DDD cervical and lumbar regions Primary Diagnosis Onset:  2005 [] Progressive Disease Relevant Past and Future Surgeries:  L5-S1 s/p laminectomy in 1993 Height:  5'2" Weight:  165# Explain and recent changes or trends in weight: N/A  Relevant History including falls:  Pt reports she has been nonambulatory since 2006; received 1st power wheelchair > 12 yrs ago with most recent power wheelchair received in 2018                                   Pt has chronic low back and LLE pain with spasms    HOME ENVIRONMENT: [] House  [] Condo/town home  [x] Apartment  [] Assisted Living    [x] Lives Alone []  Lives with Others                                                    Hours with caregiver: 0  [x] Home is accessible to patient            Stairs  [] Yes [x]  No     Ramp [] Yes [x] No Comments:  lives in handicapped apartment   COMMUNITY ADL: TRANSPORTATION: [] Car    [] Van    [x] Public Transportation    [x] Adapted w/c Lift   [] Ambulance   [] Other:       [] Sits in wheelchair during transport  Employment/School:     Specific requirements  pertaining to mobility                                                     Other:  FUNCTIONAL/SENSORY PROCESSING SKILLS:  Handedness:   [x] Right     [] Left    [] NA  Comments:                                 Functional Processing Skills for Wheeled Mobility [x] Processing Skills are adequate for safe wheelchair operation  Areas of concern than may interfere with safe operation of wheelchair Description of problem   []  Attention to environment     [] Judgment     []  Hearing  []  Vision or visual processing    [] Motor Planning  []  Fluctuations in Behavior                                                   VERBAL COMMUNICATION: [x] WFL receptive [x]  WFL expressive [] Understandable  [] Difficult to understand  [] non-communicative []  Uses an augmented communication device    CURRENT SEATING / MOBILITY: Current Mobility Base:   [] None  [] Dependent  [] Manual  [] Scooter  [x] Power   Type of Control:                       Manufacturer:   Clinical research associate                      Size:                         Age:   6 yrs                        Current Condition of Mobility Base:   in disrepair is inoperable per pt's report                                                                                                            Current Wheelchair components:                                                                                                                                   Describe posture in present seating system:  SENSATION and SKIN ISSUES: Sensation [x] Intact [] Impaired [] Absent   Level of sensation:                           Pressure Relief: Able to perform effective pressure relief :   [x] Yes  []  No Method:                                                                              If not, Why?:                                                                           Skin Issues/Skin Integrity Current Skin Issues   [] Yes [x] No  [] Intact []  Red area []  Open Area  [] Scar Tissue [x] At risk from prolonged sitting  Where                              History of Skin Issues   [] Yes [x] No  Where                                         When                                               Hx of skin flap surgeries [] Yes [x] No  Where                  When                                                  Limited sitting tolerance [] Yes [x] No Hours spent sitting in wheelchair daily:     8+                                                    Complaint of Pain:  Please describe:  pt reports pain in low back and radiating down left leg - rates pain 9/10 intensity (per chart notes has chronic low back and 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)  Swelling/Edema:  occasional edema in both feet                                                                                                                                        ADL STATUS (in reference to wheelchair use):  Indep Assist Unable Indep with Equip Not assessed Comments  Dressing                                  X                           Dresses from power wheelchair - uses reacher to retrieve items            Eating      X                                                                                                                        Toileting                                            X                          Has elevated toilet seat in bathroom                                                         Bathing                                     X                           Uses tub bench; has grab bars  Grooming/ Hygiene                                       X                      Performs from power  wheelchair                                                                 Meal Prep                                      X                                                                                    IADLS                          X                                                                                        Bowel Management: [x] Continent  [] Incontinent  [] Accidents Comments:                                                  Bladder Management: [] Continent  [] Incontinent  [x] Accidents Comments:  wears Depends                                            WHEELCHAIR SKILLS: Manual w/c Propulsion: [] UE or LE strength and endurance sufficient to participate in ADLs using manual wheelchair Arm :  [] left [] right  [] Both                                   Foot:   [] left [] right  [] Both  Distance:   Operate Scooter: []  Strength, hand grip, balance and transfer appropriate for use [] Living environment is accessible for use of scooter  Operate Power w/c:  [x]  Std. Joystick   []  Alternative Controls Indep [x]  Assist []  Dependent/ Unable []  N/A []  [x] Safe          [  x] Functional      Distance:   100'+             Bed confined without wheelchair [x]  Yes []  No   STRENGTH/RANGE OF MOTION:  Active Range of Motion Strength  Shoulder    Rt shoulder flexion = 86 degrees:  Rt abdct.= 60 degrees:  Lt shoulder flexion 72 degrees:  abdct.= 52 degrees                                                 Bil. Shoulder flexors and abductors 3-/5                                                         Elbow   WNL's bil. Elbow flexors & extensors                                                WNL's bil. Elbow flexors and extensors                                                             Wrist/Hand   WNL's bil. Wrist/hand                                                                WNL's bil. Wrist & hand musc.                                                                       Hip  RLE WFL's;   minimal AROM LLE                                                              Rt hip flexors 3/5:  Lt hip flexors 2-/5                                                           Knee   RLE WNL's:  LLE WFL's  Rt quads 3+/5:  Lt quads 3-/5                                                               Ankle RLE WFL's:  minimal AROM LLE    Rt dorsiflexors 3+: Lt 2-/5                                                                  MOBILITY/BALANCE:  []  Patient is totally dependent for mobility                                                                                               Balance Transfers Ambulation  Sitting Balance: Standing Balance: []  Independent []  Independent/Modified Independent  [x]  WFL     []  WFL []  Supervision []  Supervision  []  Uses UE for balance  []  Supervision []  Min Assist []  Ambulates with Assist                           []  Min Assist []  Min assist [x]  Mod Assist []  Ambulates with Device:  []  RW   []  StW   []  Cane   []                 []  Mod Assist []  Mod assist []  Max assist   []  Max Assist [x]  Max assist []  Dependent []  Indep. Short Distance Only  []  Unable []  Unable []  Lift / Sling Required Distance (in feet)                             []  Sliding board [x]  Unable to Ambulate: (Explain:  pt has spasms in bil. LE's with LLE>RLE with weight bearing  Cardio Status:  [x] Intact  []  Impaired   []  NA                              Respiratory Status:  [x] Intact   [] Impaired   [] NA                                     Orthotics/Prosthetics:     None  Comments (Address manual vs power w/c vs scooter): Pt is unable to stand/ambulate due to weakness and spasms in bil. LE's with LLE > RLE.  Pt is unable to functionally and effectively propel a manual wheelchair due to decreased bil. Shoulder AROM and strength.  Pt lives alone and requires a power wheelchair for  independence and safety with mobility in her home environment and for independence and safety with ADL's.  Pt is unable to operate a scooter due to her inability to transfer on and off the platform of a scooter.  Her home is not accessible for the use of a scooter due to its large turning radius.  A group 2 power wheelchair is a medical necessity for this patient to maximize independence and safety with mobility & ADL's and to reduce/minimize fall risk with these activities.                                            Anterior / Posterior Obliquity Rotation-Pelvis  PELVIS    [x] Neutral  []  Posterior  []  Anterior     [x] WFL  [] Right Elevated  [] Left Elevated   [x] WFL  [] Right Anterior []   Left Anterior    []  Fixed []  Partly Flexible [x]  Flexible  []  Other  []  Fixed  []  Partly Flexible  [x]  Flexible []  Other  []  Fixed  []  Partly Flexible  [x]  Flexible []  Other  TRUNK [x] WFL [] Thoracic Kyphosis [] Lumbar Lordosis   [x]  WFL [] Convex Right [] Convex Left   [] c-curve [] s-curve [] multiple  [x]  Neutral []  Left-anterior []  Right-anterior    []  Fixed [x]  Flexible []  Partly Flexible       Other  []  Fixed [x]  Flexible []  Partly Flexible []  Other  []  Fixed           [x]  Flexible []  Partly Flexible []  Other   Position Windswept   HIPS  [x]  Neutral []  Abduct []  ADduct [x]  Neutral []  Right []  Left       []  Fixed  []  Partly Flexible             []  Dislocated [x]  Flexible []  Subluxed    []  Fixed []  Partly Flexible  [x]  Flexible []  Other              Foot Positioning Knee Positioning   Knees and  Feet  [x]  WFL [] Left [] Right [x]  WFL [] Left [] Right   KNEES ROM concerns: ROM concerns:   & Dorsi-Flexed                    [] Lt [] Rt                                  FEET Plantar Flexed                  [] Lt [] Rt     Inversion                    [] Lt [] Rt     Eversion                    [] Lt [] Rt    HEAD [x]  Functional [x]  Good Head Control   & []   Flexed         []  Extended []  Adequate Head Control  NECK []  Rotated  Lt  []  Lat Flexed Lt []  Rotated  Rt []  Lat Flexed Rt []  Limited Head Control    []  Cervical Hyperextension []  Absent  Head Control    SHOULDERS ELBOWS WRIST& HAND         Left     Right    Left     Right  U/E [x] Functional  Left            [] Functional  Right                                 [] Fisting             [] Fisting     [] elevated Left [] depressed  Left [] elevated Right [] depressed  Right      [] protracted Left [] retracted Left [] protracted Right [] retracted Right [] subluxed  Left              [] subluxed  Right         Goals for Wheelchair Mobility  [x]  Independence with mobility in the home with motor related ADLs (MRADLs)  []  Independence with MRADLs in the community []  Provide dependent mobility  []  Provide recline     [] Provide tilt   Goals for Seating system [x]  Optimize pressure distribution [x]  Provide support needed to facilitate function or safety []  Provide corrective forces to assist with maintaining or improving posture []  Accommodate client's posture: current seated postures and positions are not flexible or will not tolerate corrective forces []  Client to be independent with relieving pressure in the wheelchair [] Enhance physiological function such as breathing, swallowing, digestion  Simulation ideas/Equipment trials:                                                                                                State why other equipment was unsuccessful:                                                                           MOBILITY BASE RECOMMENDATIONS and JUSTIFICATION: MOBILITY COMPONENT JUSTIFICATION  Manufacturer:           Model:  [id number]        Size: Width 18"          Seat Depth 18"          [x] provide transport from point A to B [x] promote Indep mobility  [x] is not a safe, functional ambulator [x] walker or cane inadequate [] non-standard width/depth necessary to  accommodate anatomical measurement []                             [] Manual Mobility Base [] non-functional ambulator    [] Scooter/POV  [] can safely operate  [] can safely transfer   [] has adequate  trunk stability  [] cannot functionally propel manual w/c  [x] Power Mobility Base  [x] non-ambulatory  [x] cannot functionally propel manual wheelchair  [x]  cannot functionally and safely operate scooter/POV [x] can safely operate and willing to  [] Stroller Base [] infant/child  [] unable to propel manual wheelchair [] allows for growth [] non-functional ambulator [] non-functional UE [] Indep mobility is not a goal at this time  [] Tilt  [] Forward                   [] Backward                  [] Powered tilt              [] Manual tilt  [] change position against gravitational force on head and shoulders  [] change position for pressure relief/cannot weight shift [] transfers  [] management of tone [] rest periods [] control edema [] facilitate postural control  []                                       [] Recline  [] Power recline on power base [] Manual recline on manual base  [] accommodate femur to back angle  [] bring to full recline for ADL care  [] change position for pressure relief/cannot weight shift [] rest periods [] repositioning for transfers or clothing/diaper /catheter changes [] head positioning  [] Lighter weight required [] self- propulsion  [] lifting []                                                 [] Heavy Duty required [] user weight greater than 250# [] extreme tone/ over active movement [] broken frame on previous chair []                                     [x]  Back  []  Angle Adjustable []  Custom molded    Captain's Seat                       [x] postural control [] control of tone/spasticity [] accommodation of range of motion [] UE functional control [x] accommodation for seating system []                                          [] provide lateral trunk support [] accommodate  deformity [x] provide posterior trunk support [x] provide lumbar/sacral support [x] support trunk in midline [x] Pressure relief over spinal processes  [x]  Seat Cushion     Captain's seat                  [] impaired sensation  [] decubitus ulcers present [] history of pressure ulceration [] prevent pelvic extension [x] low maintenance  [x] stabilize pelvis  [] accommodate obliquity [] accommodate multiple deformity [x] neutralize lower extremity position [x] increase pressure distribution []                                           []  Pelvic/thigh support  []  Lateral thigh guide []  Distal medial pad  []  Distal lateral pad []  pelvis in neutral [] accommodate pelvis []  position upper legs []  alignment []  accommodate ROM []  decrease adduction [] accommodate tone [] removable for transfers [] decrease abduction  []   Lateral trunk Supports []  Lt     []  Rt [] decrease lateral trunk leaning [] control tone [] contour for increased contact [] safety  [] accommodate asymmetry []                                                 []  Mounting hardware  [] lateral trunk supports  [] back   [] seat [] headrest      []  thigh support [] fixed   [] swing away [] attach seat platform/cushion to w/c frame [] attach back cushion to w/c frame [] mount postural supports [] mount headrest  [] swing medial thigh support away [] swing lateral supports away for transfers  []                                                     Armrests  [] fixed [x] adjustable height [] removable   [] swing away  [x] flip back   [] reclining [x] full length pads [] desk    [] pads tubular  [x] provide support with elbow at 90   [] provide support for w/c tray [x] change of height/angles for variable activities [] remove for transfers [] allow to come closer to table top [x] remove for access to tables []                                               Hangers/ Leg rests  [] 60 [] 70 [] 90 [] elevating [] heavy duty  [] articulating [] fixed [] lift off [] swing  away     [] power [] provide LE support  [] accommodate to hamstring tightness [] elevate legs during recline   [] provide change in position for Legs [] Maintain placement of feet on footplate [] durability [] enable transfers [] decrease edema [] Accommodate lower leg length []                                         Foot support Footplate    [] Lt  []  Rt  [x]  Center mount [x] flip up                            [x] depth/angle adjustable [] Amputee adapter    []  Lt     []  Rt [x] provide foot support [x] accommodate to ankle ROM [x] transfers [] Provide support for residual extremity []  allow foot to go under wheelchair base []  decrease tone  []                                                 []  Ankle strap/heel loops [] support foot on foot support [] decrease extraneous movement [] provide input to heel  [] protect foot  Tires: [] pneumatic  [x] flat free inserts  [] solid  [x] decrease maintenance  [x] prevent frequent flats [] increase shock absorbency [] decrease pain from road shock [] decrease spasms from road shock []                                              []   Headrest  [] provide posterior head support [] provide posterior neck support [] provide lateral head support [] provide anterior head support [] support during tilt and recline [] improve feeding   [] improve respiration [] placement of switches [] safety  [] accommodate ROM  [] accommodate tone [] improve visual orientation  []  Anterior chest strap []  Vest []  Shoulder retractors  [] decrease forward movement of shoulder [] accommodation of TLSO [] decrease forward movement of trunk [] decrease shoulder elevation [] added abdominal support [] alignment [] assistance with shoulder control  []                                               Pelvic Positioner [x] Belt [] SubASIS bar [] Dual Pull [] stabilize tone [x] decrease falling out of chair/ **will not Decrease potential for sliding due to pelvic tilting [] prevent excessive rotation [] pad for  protection over boney prominence [] prominence comfort [] special pull angle to control rotation []                                                  Upper ExtremitySupport  [] L   []  R [] Arm trough   [] hand support []  tray       [] full tray [] swivel mount [] decrease edema      [] decrease subluxation   [] control tone   [] placement for AAC/Computer/EADL [] decrease gravitational pull on shoulders [] provide midline positioning [] provide support to increase UE function [] provide hand support in natural position [] provide work surface   POWER WHEELCHAIR CONTROLS  [x] Proportional  [] Non-Proportional Type                                      [] Left  [x] Right [x] provides access for controlling wheelchair   [] lacks motor control to operate proportional drive control [] unable to understand proportional controls  Actuator Control Module  [] Single  [] Multiple   [] Allow the client to operate the power seat function(s) through the joystick control   [] Safety Reset Switches [] Used to change modes and stop the wheelchair when driving in latch mode    [] Upgraded Electronics   [] programming for accurate control [] progressive Disease/changing condition [] non-proportional drive control needed [] Needed in order to operate power seat functions through joystick control   [] Display box [] Allows user to see in which mode and drive the wheelchair is set  [] necessary for alternate controls    [] Digital interface electronics [] Allows w/c to operate when using alternative drive controls  [] ASL Head Array [] Allows client to operate wheelchair  through switches placed in tri-panel headrest  [] Sip and puff with tubing kit [] needed to operate sip and puff drive controls  [] Upgraded tracking electronics [] increase safety when driving [] correct tracking when on uneven surfaces  [x] Mount for switches or joystick [] Attaches switches to w/c  [x] Swing away for access or transfers [] midline for optimal  placement [] provides for consistent access  [] Attendant controlled joystick plus mount [] safety [] long distance driving [] operation of seat functions [] compliance with transportation regulations []                                             Rear wheel placement/Axle adjustability [] None [] semi adjustable [] fully  adjustable  [] improved UE access to wheels [] improved stability [] changing angle in space for improvement of postural stability [] 1-arm drive access [] amputee pad placement []                                Wheel rims/ hand rims  [] metal   [] plastic coated [] oblique projections           [] vertical projections [] Provide ability to propel manual wheelchair  []  Increase self-propulsion with hand weakness/decreased grasp  Push handles [] extended   [] angle adjustable              [] standard [] caregiver access [] caregiver assist [] allows "hooking" to enable increased ability to perform ADLs or maintain balance  One armed device   [] Lt   [] Rt [] enable propulsion of manual wheelchair with one arm   []                                            Brake/wheel lock extension []  Lt   []  Rt [] increase indep in applying wheel locks   [] Side guards [] prevent clothing getting caught in wheel or becoming soiled []  prevent skin tears/abrasions  Battery:  U1 x 2                                          [x] to power wheelchair                                                         Other:                                                                                                                        The above equipment has a life- long use expectancy. Growth and changes in medical and/or functional conditions would be the exceptions. This is to certify that the therapist has no financial relationship with durable medical provider or manufacturer. The therapist will not receive remuneration of any kind for the equipment recommended in this evaluation.   Patient has mobility limitation that  significantly impairs safe, timely participation in one or more mobility related ADL's. (bathing, toileting, feeding, dressing, grooming, moving from room to room)  [x]  Yes []  No  Will mobility device sufficiently improve ability to participate and/or be aided in participation of MRADL's?      [x]  Yes []  No  Can limitation be compensated for with use of a cane or walker?                                    []   Yes [x]  No  Does patient or caregiver demonstrate ability/potential ability & willingness to safely use the mobility device?    [x]  Yes []  No  Does patient's home environment support use of recommended mobility device?            [x]  Yes []  No  Does patient have sufficient upper extremity function necessary to functionally propel a manual wheelchair?     []  Yes [x]  No  Does patient have sufficient strength and trunk stability to safely operate a POV (scooter)?                                  []  Yes [x]  No  Does patient need additional features/benefits provided by a power wheelchair for MRADL's in the home?        [x]  Yes []  No  Does the patient demonstrate the ability to safely use a power wheelchair?                   [x]  Yes []  No     Physician's Name Printed:                                                        Physician's Signature: Marthenia Rolling. Mikey Bussing, DO Date:     This is to certify that I, the above signed therapist have the following affiliations: []  This DME provider []  Manufacturer of recommended equipment []  Patient's long term care facility [x]  None of the above  Therapist Name/Signature:  Kerry Fort, PT                                          Date:  07-03-23  ASSESSMENT:  CLINICAL IMPRESSION: Patient is a 68 y.o. lady who was seen today for physical therapy evaluation for power wheelchair. A group 2 power wheelchair with captain's seat has been recommended.  Josh Cadle, ATP with [facility], present in consult for power wheelchair evaluation.      CLINICAL  DECISION MAKING: Evolving/moderate complexity  EVALUATION COMPLEXITY: Moderate  PLAN:  PT FREQUENCY: one time visit  PT DURATION: 1 week  PLANNED INTERVENTIONS:  power w/c evaluation only .  PLAN FOR NEXT SESSION: N/A - eval only   Kary Kos, PT 07/03/2023, 9:38 AM

## 2023-07-03 NOTE — Telephone Encounter (Signed)
 Appt for mammogram July 7.2025 @ 10:20 am to arrive 10:00 am/ appointment mailed to the patient and also spoke with patient, she is aware of the 75.00 no show charge.

## 2023-07-03 NOTE — Telephone Encounter (Signed)
 Left voice message for patient to return call so mammogram can be scheduled -have some questions I need to ask before scheduling.

## 2023-07-06 ENCOUNTER — Other Ambulatory Visit: Payer: Self-pay

## 2023-07-06 DIAGNOSIS — K219 Gastro-esophageal reflux disease without esophagitis: Secondary | ICD-10-CM

## 2023-07-06 DIAGNOSIS — E785 Hyperlipidemia, unspecified: Secondary | ICD-10-CM

## 2023-07-06 MED ORDER — PANTOPRAZOLE SODIUM 20 MG PO TBEC: 20.0000 mg | DELAYED_RELEASE_TABLET | Freq: Every day | ORAL | 3 refills | Status: DC

## 2023-07-06 MED ORDER — EZETIMIBE 10 MG PO TABS: 10.0000 mg | ORAL_TABLET | Freq: Every day | ORAL | 3 refills | Status: DC

## 2023-07-06 NOTE — Telephone Encounter (Signed)
 Medication sent to pharmacy

## 2023-07-16 ENCOUNTER — Telehealth: Payer: Self-pay | Admitting: *Deleted

## 2023-07-16 NOTE — Telephone Encounter (Signed)
 Spoke with [facility].  Pt is sch on 07/19/2023 to complete the process.  Informed the patient that an OV is needed.  Forms are in the PCP's box already.  Appt sch for the following:   Name: Miranda Gaines, Miranda Gaines MRN: [id number]  Date: 07/19/2023 Status: Sch  Time: 9:45 AM Length: 30  Visit Type: OPEN ESTABLISHED [726] Copay: $0.00  Provider: Rudene Christians, DO

## 2023-07-16 NOTE — Telephone Encounter (Signed)
 Copied from CRM [id number]. Topic: Medical Record Request - Other >> Jul 16, 2023  3:41 PM Maree Krabbe H wrote: Reason for CRM: [employer] called Maralyn Sago) and she is needing the request from the therapist, and the doctors notes from last visit with discussing the power chair, you can call Maralyn Sago back at [phone].

## 2023-07-17 NOTE — Telephone Encounter (Signed)
 Patient rescheduled OV to 07/24/23 to allow time to arrange transportation.

## 2023-07-18 ENCOUNTER — Telehealth: Payer: Self-pay | Admitting: Internal Medicine

## 2023-07-18 NOTE — Telephone Encounter (Signed)
 Copied from CRM [id number]. Topic: Appointments - Appointment Info/Confirmation >> Jul 17, 2023  9:26 AM Prudencio Pair wrote: Patient/patient representative is calling for information regarding an appointment. Hardie Lora, with [facility], called to verify appt for April 1st for patient. >> Jul 17, 2023  4:48 PM Tiffany H wrote: Patient called to advise that she secured transportation for 07/24/23 visit.

## 2023-07-19 ENCOUNTER — Encounter: Payer: Self-pay | Admitting: Internal Medicine

## 2023-07-19 NOTE — Telephone Encounter (Signed)
 Patient mammogram scheduled for 10-29-2023 breast center [phone].

## 2023-07-24 ENCOUNTER — Ambulatory Visit: Payer: Self-pay | Admitting: Internal Medicine

## 2023-07-24 VITALS — BP 146/70 | HR 79 | Temp 98.2°F | Ht 62.0 in | Wt 148.1 lb

## 2023-07-24 DIAGNOSIS — M5416 Radiculopathy, lumbar region: Secondary | ICD-10-CM | POA: Diagnosis not present

## 2023-07-24 DIAGNOSIS — I10 Essential (primary) hypertension: Secondary | ICD-10-CM | POA: Diagnosis not present

## 2023-07-24 NOTE — Progress Notes (Signed)
 Subjective:  CC: power wheelchair  HPI:  [name] is a 68 y.o. female with a past medical history of lumbosacral spondylosis, cervical radiculopathy who presents today for appointment for power wheelchair.  She was seen in February and referred to rehab for evaluation for power chair. She underwent outpatient physical therapy wheelchair evaluation 3/11.  She is frustrated as she has had to wait more than 5 weeks for a new wheelchair.  She has several appointments coming up but she is worried about getting to with a manual wheelchair.  She has had more difficulty completing her activities of daily living because of not having her power wheelchair.  Please see problem based assessment and plan for additional details.  Past Medical History:  Diagnosis Date   Constipation 06/24/2015   Degenerative disc disease, cervical    L spine 10/09 showing DJD and cervical spurring but no specific foraminal narrowing. Has been followed by Rochele Pages, pain management physician in [place].   Depression    [facility]   GERD (gastroesophageal reflux disease)    Hearing impairment; bilateral hearing aides 09/30/2021   Herniated disc    L5-S1. s/p laminectomy in 1993 by Dr. Shelle Iron.  MRI June 2005 showing L4/5 disc bulge + mild facet arthropathy. F/b Dr. Marzetta Board for pain management. Wheel chair bound.   Hyperlipidemia    Hypertension    Impaired physical mobility 11/03/2016   Pyloric stenosis    s/p EGD with balloon dilatation on 06/23/2007 by Dr. Elnoria Howard   Recurrent otitis externa of right ear 06/20/2006   Qualifier: Diagnosis of  By: Reynold Bowen MD, Mariane     Seborrheic dermatitis    of face, sees Dr. Horton Chin, dermatop cream TID   Tobacco abuse    Did not tolerate Chantix (vomiting)    MEDICATIONS:  Atorvastatin 80 mg Lisinopril 40 mg Amlodipine 10 mg Zetia 10 mg Pantoprazole 20 mg Trazodone 50 mg at bedtime Nortriptyline 75 mg  Family History  Problem Relation Age of Onset    Coronary artery disease Mother        deceased at 77   Cancer Father        deceased at 5, lung cancer    Past Surgical History:  Procedure Laterality Date   EGD w. balllon dilation for pyloric stenosis  04/25/2007   Dr. Elnoria Howard   LUMBAR LAMINECTOMY  04/25/1991   Dr. Leandra Kern   LUMBAR LAMINECTOMY       Social History   Socioeconomic History   Marital status: Legally Separated    Spouse name: Not on file   Number of children: 0   Years of education: Not on file   Highest education level: Not on file  Occupational History   Occupation: diabled  Tobacco Use   Smoking status: Former    Current packs/day: 0.00    Types: Cigarettes    Quit date: 02/10/2014    Years since quitting: 9.4   Smokeless tobacco: Former   Tobacco comments:    quit at 2012  Vaping Use   Vaping status: Never Used  Substance and Sexual Activity   Alcohol use: No    Alcohol/week: 0.0 standard drinks of alcohol   Drug use: No   Sexual activity: Not Currently  Other Topics Concern   Not on file  Social History Narrative   Divorced, no children.   Wheelchair bound from chronic back pain 2/2 workplace injury in her 75s. She used to be a Psychologist, occupational.  Lives alone in a house.   No alcohol or drug use.   Menopause in 2001   Last sexual activity in 2001   Social Drivers of Health   Financial Resource Strain: High Risk (01/03/2023)   Overall Financial Resource Strain (CARDIA)    Difficulty of Paying Living Expenses: Hard  Food Insecurity: No Food Insecurity (01/03/2023)   Hunger Vital Sign    Worried About Running Out of Food in the Last Year: Never true    Ran Out of Food in the Last Year: Never true  Transportation Needs: Unmet Transportation Needs (01/03/2023)   PRAPARE - Transportation    Lack of Transportation (Medical): No    Lack of Transportation (Non-Medical): Yes  Physical Activity: Insufficiently Active (01/03/2023)   Exercise Vital Sign    Days of Exercise per Week: 2 days    Minutes  of Exercise per Session: 10 min  Stress: Stress Concern Present (01/03/2023)   [employer] of Occupational Health - Occupational Stress Questionnaire    Feeling of Stress : Very much  Social Connections: Moderately Isolated (01/03/2023)   Social Connection and Isolation Panel [NHANES]    Frequency of Communication with Friends and Family: More than three times a week    Frequency of Social Gatherings with Friends and Family: Never    Attends Religious Services: More than 4 times per year    Active Member of [employer] or Organizations: No    Attends Banker Meetings: Never    Marital Status: Separated  Intimate Partner Violence: Patient Unable To Answer (01/03/2023)   Humiliation, Afraid, Rape, and Kick questionnaire    Fear of Current or Ex-Partner: Patient unable to answer    Emotionally Abused: Patient unable to answer    Physically Abused: Patient unable to answer    Sexually Abused: Patient unable to answer    Review of Systems: ROS negative except for what is noted on the assessment and plan.  Objective:   Vitals:   07/24/23 0929  BP: (!) 146/70  Pulse: 79  Temp: 98.2 F (36.8 C)  TempSrc: Oral  SpO2: 100%  Weight: 148 lb 1.6 oz (67.2 kg)  Height: 5\' 2"  (1.575 m)    Physical Exam: Constitutional: well-appearing, in no acute distress Cardiovascular: regular rate and rhythm, no m/r/g Pulmonary/Chest: normal work of breathing on room air, lungs clear to auscultation bilaterally Abdominal: soft, non-tender, non-distended MSK: sitting in manual wheelchair Skin: warm and dry Psych: frustrated  Assessment & Plan:  Lumbar radiculopathy Patient presenting for face-to-face exam for power mobility device.  She already completed exam with physical therapy on 3/11.  With their evaluation they found the patient was not able to stand due to weakness and spasms in bilateral lower extremities. Cane or walker would not be sufficient for safety.  He does not have  sufficient upper extremity strength to self propel a manual wheelchair due to known cervical radiculopathy.  Physical therapy assessment showed that she can safely use power wheelchair.  Use of power wheelchair will significantly improve patient's ability to complete ADLs independently.  I agree with physical therapy assessment. P: Paperwork signed by resident and Dr. Mikey Bussing.   Hypertension Blood pressure elevated at 146/70.  Home medications include amlodipine 10 mg and lisinopril 40 mg.  No recent dispense history on lisinopril.  I encouraged patient to pick up medication to start taking.  Follow-up with primary care physician at end of May.  Patient discussed with Dr. Laney Pastor Audris Speaker, D.O. [facility]  PGY-3 Pager: [phone]  Phone: [phone] Date 07/24/2023  Time 1:28 PM

## 2023-07-24 NOTE — Assessment & Plan Note (Signed)
 Blood pressure elevated at 146/70.  Home medications include amlodipine 10 mg and lisinopril 40 mg.  No recent dispense history on lisinopril.  I encouraged patient to pick up medication to start taking.  Follow-up with primary care physician at end of May.

## 2023-07-24 NOTE — Assessment & Plan Note (Signed)
 Patient presenting for face-to-face exam for power mobility device.  She already completed exam with physical therapy on 3/11.  With their evaluation they found the patient was not able to stand due to weakness and spasms in bilateral lower extremities. Cane or walker would not be sufficient for safety.  He does not have sufficient upper extremity strength to self propel a manual wheelchair due to known cervical radiculopathy.  Physical therapy assessment showed that she can safely use power wheelchair.  Use of power wheelchair will significantly improve patient's ability to complete ADLs independently.  I agree with physical therapy assessment. P: Paperwork signed by resident and Dr. Mikey Bussing.

## 2023-07-24 NOTE — Patient Instructions (Signed)
 Thank you, Ms.[name] for allowing [place] to provide your care today.   We will submit this paperwork today. It sounds like it will take several more weeks for your chair to come due to insurance.  I have ordered the following medication/changed the following medications:   Stop the following medications: There are no discontinued medications.   Start the following medications: No orders of the defined types were placed in this encounter.   We look forward to seeing you next time. Please call our clinic at [phone] if you have any questions or concerns. The best time to call is Monday-Friday from 9am-4pm, but there is someone available 24/7. If after hours or the weekend, call the main hospital number and ask for the Internal Medicine Resident On-Call. If you need medication refills, please notify your pharmacy one week in advance and they will send [place] a request.   Thank you for trusting me with your care. Wishing you the best!   Rudene Christians, DO [facility]

## 2023-07-26 NOTE — Progress Notes (Signed)
 Internal Medicine Clinic Attending  Case discussed with the resident at the time of the visit.  We reviewed the resident's history and exam and pertinent patient test results.  I agree with the assessment, diagnosis, and plan of care documented in the resident's note.

## 2023-08-15 NOTE — Telephone Encounter (Signed)
 The pt's 2nd form has now been completed and faxed in regards to her PWC from [facility].   Copied from CRM [id number]. Topic: Referral - Status >> Aug 14, 2023  1:31 PM Lenon Radar A wrote: Reason for CRM: Patient called in regarding status of power chair. Please contact patient to provide update as there is no updated in chart and patient has completed eval for power chair per chart. Patient can be reached at [phone].

## 2023-08-22 DIAGNOSIS — M5416 Radiculopathy, lumbar region: Secondary | ICD-10-CM | POA: Diagnosis not present

## 2023-08-22 DIAGNOSIS — Z7409 Other reduced mobility: Secondary | ICD-10-CM | POA: Diagnosis not present

## 2023-08-22 DIAGNOSIS — I10 Essential (primary) hypertension: Secondary | ICD-10-CM | POA: Diagnosis not present

## 2023-08-22 DIAGNOSIS — M5412 Radiculopathy, cervical region: Secondary | ICD-10-CM | POA: Diagnosis not present

## 2023-09-20 ENCOUNTER — Encounter: Admitting: Internal Medicine

## 2023-09-22 DIAGNOSIS — Z7409 Other reduced mobility: Secondary | ICD-10-CM | POA: Diagnosis not present

## 2023-09-22 DIAGNOSIS — I10 Essential (primary) hypertension: Secondary | ICD-10-CM | POA: Diagnosis not present

## 2023-09-22 DIAGNOSIS — M5412 Radiculopathy, cervical region: Secondary | ICD-10-CM | POA: Diagnosis not present

## 2023-09-22 DIAGNOSIS — M5416 Radiculopathy, lumbar region: Secondary | ICD-10-CM | POA: Diagnosis not present

## 2023-10-02 ENCOUNTER — Ambulatory Visit: Admitting: Internal Medicine

## 2023-10-02 VITALS — BP 140/80 | HR 72 | Temp 98.3°F | Ht 62.0 in

## 2023-10-02 DIAGNOSIS — Z1211 Encounter for screening for malignant neoplasm of colon: Secondary | ICD-10-CM

## 2023-10-02 DIAGNOSIS — E7801 Familial hypercholesterolemia: Secondary | ICD-10-CM

## 2023-10-02 DIAGNOSIS — L719 Rosacea, unspecified: Secondary | ICD-10-CM

## 2023-10-02 DIAGNOSIS — I10 Essential (primary) hypertension: Secondary | ICD-10-CM | POA: Diagnosis not present

## 2023-10-02 DIAGNOSIS — Z8719 Personal history of other diseases of the digestive system: Secondary | ICD-10-CM | POA: Diagnosis not present

## 2023-10-02 DIAGNOSIS — R21 Rash and other nonspecific skin eruption: Secondary | ICD-10-CM

## 2023-10-02 DIAGNOSIS — L299 Pruritus, unspecified: Secondary | ICD-10-CM | POA: Diagnosis not present

## 2023-10-02 MED ORDER — HYDROXYZINE HCL 10 MG PO TABS: 10.0000 mg | ORAL_TABLET | Freq: Every evening | ORAL | 5 refills | Status: DC | PRN

## 2023-10-02 MED ORDER — METRONIDAZOLE 0.75 % EX GEL
1.0000 | Freq: Two times a day (BID) | CUTANEOUS | 0 refills | Status: AC
Start: 2023-10-02 — End: ?

## 2023-10-02 NOTE — Patient Instructions (Addendum)
 Ms. Miranda Gaines,  It was great to see you today!  I'm so pleased that you were able to receive your wheelchair and that you are getting out and about like you want.  We talked about a lot of things today.  1.  We are rechecking your cholesterol-it has been very very high even though you are on two strong medicines.  It is likely we will need to start a new medication to get this under control.  Controlling your cholesterol helps decrease your chances of having a stroke and heart attack!  2.  For the rash across your cheeks, I have prescribed a medicine called MetroGel  which you should apply lightly twice a day until your bumps are gone.  This is not a steroid.  It is for a condition that is very common called rosacea.  If the medicine does not work after 1 week, please stop taking it and let me know.  3.  For your itching at night, I have renewed your prescription for hydroxyzine .  You may take 1 or 2 pills at night when the itching begins.  4.  Thank you for getting your mammogram scheduled.  For colon cancer screening, we discussed using a stool test called a Cologuard.  You will receive a sample collection kit.  Please bring your medications to the next visit so that we can review them!  Take care and stay well, Dr. Trudy

## 2023-10-02 NOTE — Progress Notes (Signed)
 68 y.o. Miranda Gaines is here for routine follow-up of chronic pain due to radiculopathy which impairs her physical function.  She is also treated for hypertension. Since last visit patient has seen I last saw her in January 2024.  Since then she has been seen in the [facility] by my colleagues on 06/20/2023 and again on 07/24/2023; has been in process of obtaining power WC through appropriate referrals.  She completed her annual wellness exam in 12/2022.  She has completed her mammogram screening.  She is due for 10-year colonoscopy screening.  Labs 05/2023 revealed severe hypercholesterolemia consistent with familial variant which we will discuss today.  Continues to have medication associated constipation from nortryptiline (though doesn't endorse dry mouth), has next behavioral health appt in 10/2023.  Eats prunes, drinks prune juice, doesn't need anything else.   Has a bump on her gum is coming from constipation (everytime I get constipation I get a bump in my mouth [under dentures], then it goes away when I take prunes juice and soak my mouth with warm salt water).  Not sure I can explain that one.   Hearing aides are working well.  Hasn't ever had her eyes checked due to transportation - medicaid doesn't pay for transportation to get eyes examined, but will pay if she needs surgery.  We'll look into this [facility]).  Uses OTC reading glasses to read print.  Sleeping well, appetite is good. Stress about her WC has resolved so that everything is better.  No difficulties with breathing or concerns with her chest.  Endorses problems with digestion; acid reflux.  Perhaps some dietary intolerances, feels that she gets very itchy when she eats something sweet (candy, grapes); drinks water to flush out the sugar, which helps.   No fam hx of colon cancer Mammogram scheduled in 10/2023.   Patient Active Problem List   Diagnosis Date Noted   Pyloric stenosis 05/17/2022   Prediabetes 05/05/2022   Numbness of toes  05/05/2022   Tender vein 05/05/2022   Hearing impairment; bilateral hearing aides 09/30/2021   Facial rash 12/17/2020   Eczema 06/05/2017   Impaired physical mobility 11/03/2016   Overweight (BMI 25.0-29.9) 07/01/2015   Drug induced constipation 06/24/2015   Cervical radiculopathy 06/19/2014   Lumbar radiculopathy 05/01/2013   Lumbosacral spondylosis 05/01/2013   Healthcare maintenance 05/28/2012   History of excessive cerumen 11/06/2011   DDD (degenerative disc disease), lumbosacral 05/04/2011   Chronic sinusitis 04/26/2011   Recurrent otitis externa of right ear 06/20/2006   Hyperlipidemia 05/04/2006   Depression 05/04/2006   Hypertension 05/04/2006   GERD without esophagitis 05/04/2006   Chronic low back pain 05/04/2006    Current Outpatient Medications:    amLODipine  (NORVASC ) 10 MG tablet, Take 1 tablet (10 mg total) by mouth daily., Disp: 90 tablet, Rfl: 3   atorvastatin  (LIPITOR) 80 MG tablet, TAKE 1 TABLET BY MOUTH ONCE DAILY AT 6 PM, Disp: 90 tablet, Rfl: 3   ezetimibe  (ZETIA ) 10 MG tablet, Take 1 tablet (10 mg total) by mouth daily., Disp: 90 tablet, Rfl: 3   fluticasone  (FLONASE ) 50 MCG/ACT nasal spray, INSTILL 1 SPRAY IN EACH NOSTRIL DAILY, Disp: 16 mL, Rfl: 3   hydrOXYzine  (ATARAX ) 10 MG tablet, Take by mouth., Disp: , Rfl:    lisinopril  (ZESTRIL ) 40 MG tablet, Take one tablet every day for high blood pressure., Disp: 90 tablet, Rfl: 3   loratadine  (CLARITIN ) 10 MG tablet, Take 1 tablet by mouth daily., Disp: , Rfl:    nortriptyline (  PAMELOR) 75 MG capsule, Take 75 mg by mouth at bedtime., Disp: , Rfl:    pantoprazole  (PROTONIX ) 20 MG tablet, Take 1 tablet (20 mg total) by mouth daily., Disp: 90 tablet, Rfl: 3   traZODone (DESYREL) 50 MG tablet, Take 50 mg by mouth at bedtime. Prescribed by Mental Health. , Disp: , Rfl:   Functional Status: Lives alone in an appt.  Gets [employer] transportation to for the store, and church, and uses medicaid transportation for doctor appts.   Uses tub bench to slide into shower.  No problems with showering or dressing.  Cooks from the chair, mostly MW.  Doesn't get out much, keeps entertained by watching TV, listening to music, doing puzzles, lookiing out the window.  Can go out to relatives to sit in the yard and visit.  Motorized WC has greatly helped her community mobility.  Objective Pulse 63   Temp 98.3 F (36.8 C) (Oral)   Ht 5' 2 (1.575 m)   SpO2 98%   BMI 27.09 kg/m   Exam: Bright affect, sitting in her new WC.  Face with scattered red papules primarily malar surface and nose. Heart RRR, lungs clear.  Periphery well perfused.    Problems addressed today:  Pruritic disorder Assessment & Plan: Pt attributes this to eating sugar in the evening only; sxs happen when she lies down to sleep.  Sxs don't occur if she eats sugar in the morning.   No rash, itching is diffuse (head to toe).  No new linens, no new detergent, not using new lotions or products.  Will trial hydroxyzine ; she has been on this in the past.  No red flags for systemic illness.   Screen for colon cancer -     Cologuard  Familial hypercholesterolemia Assessment & Plan: Poor lipid control (primary prevention) 05/2023 despite atorvastatin  80 mg and ezetimibe  10 mg prescribed.  She endorses adherence (mail order pharm).  Repeat lipids today.  She has limited understanding of the importance of lipid management, which we discussed today.   Orders: -     Lipid panel  Primary hypertension Assessment & Plan: 140/80 on amlodipine  10 mg, lisinopril  40 mg.  Endorsed taking meds this am.  Acceptable control, option if future poor control would be changing lisinopril  to an ARB.   Facial rash Assessment & Plan: Will treat as rosacea with topical metronidazole .

## 2023-10-02 NOTE — Assessment & Plan Note (Addendum)
 Pt attributes this to eating sugar in the evening only; sxs happen when she lies down to sleep.  Sxs don't occur if she eats sugar in the morning.   No rash, itching is diffuse (head to toe).  No new linens, no new detergent, not using new lotions or products.  Will trial hydroxyzine ; she has been on this in the past.  No red flags for systemic illness.

## 2023-10-03 LAB — LIPID PANEL
Chol/HDL Ratio: 5.2 ratio — ABNORMAL HIGH (ref 0.0–4.4)
Cholesterol, Total: 217 mg/dL — ABNORMAL HIGH (ref 100–199)
HDL: 42 mg/dL (ref >39)
LDL Chol Calc (NIH): 152 mg/dL — ABNORMAL HIGH (ref 0–99)
Triglycerides: 129 mg/dL (ref 0–149)
VLDL Cholesterol Cal: 23 mg/dL (ref 5–40)

## 2023-10-05 ENCOUNTER — Telehealth: Payer: Self-pay | Admitting: *Deleted

## 2023-10-05 ENCOUNTER — Other Ambulatory Visit: Payer: Self-pay | Admitting: Internal Medicine

## 2023-10-05 DIAGNOSIS — L299 Pruritus, unspecified: Secondary | ICD-10-CM

## 2023-10-05 DIAGNOSIS — L719 Rosacea, unspecified: Secondary | ICD-10-CM

## 2023-10-05 MED ORDER — HYDROXYZINE HCL 10 MG PO TABS: 10.0000 mg | ORAL_TABLET | Freq: Every evening | ORAL | 5 refills | Status: DC | PRN

## 2023-10-05 MED ORDER — METRONIDAZOLE 0.75 % EX GEL
1.0000 | Freq: Two times a day (BID) | CUTANEOUS | 0 refills | Status: DC
Start: 2023-10-05 — End: 2024-02-19

## 2023-10-05 NOTE — Telephone Encounter (Signed)
 Call to [facility] prescriptions were not filled as patient is no longer using their Pharmacy.  Will forward to doctor to send prescriptions to [facility].  Call to patient message left that the prescriptions will be sent to the [facility] for her. Copied from CRM [id number]. Topic: Clinical - Prescription Issue >> Oct 05, 2023 10:31 AM Blair Bumpers wrote: Reason for CRM: Patient called in about her medications that she hasn't received yet. She states the medications are hydrOXYzine  (ATARAX ) 10 MG tablet & metroNIDAZOLE  (METROGEL ) 0.75 % gel. Patient stated that her prescriptions are to go to [facility]. Advised patient that the medications were sent to [facility] pharmacy. I provided her with [facility] number to call to inquire about her order. Please give patient a call back. CB #: [phone]. >> Oct 05, 2023 10:40 AM Adrianna P wrote: Patient called again please send prescriptions over to [facility] to be filled

## 2023-10-22 DIAGNOSIS — Z7409 Other reduced mobility: Secondary | ICD-10-CM | POA: Diagnosis not present

## 2023-10-22 DIAGNOSIS — M5412 Radiculopathy, cervical region: Secondary | ICD-10-CM | POA: Diagnosis not present

## 2023-10-22 DIAGNOSIS — I10 Essential (primary) hypertension: Secondary | ICD-10-CM | POA: Diagnosis not present

## 2023-10-22 DIAGNOSIS — M5416 Radiculopathy, lumbar region: Secondary | ICD-10-CM | POA: Diagnosis not present

## 2023-10-29 ENCOUNTER — Ambulatory Visit
Admission: RE | Admit: 2023-10-29 | Discharge: 2023-10-29 | Disposition: A | Discharge status: Home / Self Care | Source: Ambulatory Visit | Attending: Internal Medicine | Admitting: Internal Medicine

## 2023-10-29 DIAGNOSIS — Z1231 Encounter for screening mammogram for malignant neoplasm of breast: Secondary | ICD-10-CM

## 2023-11-22 DIAGNOSIS — Z7409 Other reduced mobility: Secondary | ICD-10-CM | POA: Diagnosis not present

## 2023-11-22 DIAGNOSIS — I10 Essential (primary) hypertension: Secondary | ICD-10-CM | POA: Diagnosis not present

## 2023-11-22 DIAGNOSIS — M5416 Radiculopathy, lumbar region: Secondary | ICD-10-CM | POA: Diagnosis not present

## 2023-11-22 DIAGNOSIS — M5412 Radiculopathy, cervical region: Secondary | ICD-10-CM | POA: Diagnosis not present

## 2023-11-26 ENCOUNTER — Encounter: Payer: Self-pay | Admitting: Internal Medicine

## 2023-11-26 NOTE — Assessment & Plan Note (Signed)
 Poor lipid control (primary prevention) 05/2023 despite atorvastatin  80 mg and ezetimibe  10 mg prescribed.  She endorses adherence (mail order pharm).  Repeat lipids today.  She has limited understanding of the importance of lipid management, which we discussed today.

## 2023-11-26 NOTE — Assessment & Plan Note (Signed)
 140/80 on amlodipine  10 mg, lisinopril  40 mg.  Endorsed taking meds this am.  Acceptable control, option if future poor control would be changing lisinopril  to an ARB.

## 2023-11-26 NOTE — Assessment & Plan Note (Signed)
 Will treat as rosacea with topical metronidazole .

## 2023-11-26 NOTE — Assessment & Plan Note (Signed)
 Mood is significantly improved since she finally received her motorized WC and has been able to get around the community.  Prescribed by the suboptimal nortriptyline by outside provider.

## 2023-12-23 DIAGNOSIS — M5412 Radiculopathy, cervical region: Secondary | ICD-10-CM | POA: Diagnosis not present

## 2023-12-23 DIAGNOSIS — Z7409 Other reduced mobility: Secondary | ICD-10-CM | POA: Diagnosis not present

## 2023-12-23 DIAGNOSIS — I10 Essential (primary) hypertension: Secondary | ICD-10-CM | POA: Diagnosis not present

## 2023-12-23 DIAGNOSIS — M5416 Radiculopathy, lumbar region: Secondary | ICD-10-CM | POA: Diagnosis not present

## 2024-01-22 DIAGNOSIS — I10 Essential (primary) hypertension: Secondary | ICD-10-CM | POA: Diagnosis not present

## 2024-01-22 DIAGNOSIS — Z7409 Other reduced mobility: Secondary | ICD-10-CM | POA: Diagnosis not present

## 2024-01-22 DIAGNOSIS — M5416 Radiculopathy, lumbar region: Secondary | ICD-10-CM | POA: Diagnosis not present

## 2024-01-22 DIAGNOSIS — M5412 Radiculopathy, cervical region: Secondary | ICD-10-CM | POA: Diagnosis not present

## 2024-01-28 DIAGNOSIS — M4727 Other spondylosis with radiculopathy, lumbosacral region: Secondary | ICD-10-CM | POA: Diagnosis not present

## 2024-01-28 DIAGNOSIS — M961 Postlaminectomy syndrome, not elsewhere classified: Secondary | ICD-10-CM | POA: Diagnosis not present

## 2024-02-19 ENCOUNTER — Ambulatory Visit: Payer: Self-pay | Admitting: *Deleted

## 2024-02-19 ENCOUNTER — Other Ambulatory Visit: Payer: Self-pay | Admitting: *Deleted

## 2024-02-19 DIAGNOSIS — Z23 Encounter for immunization: Secondary | ICD-10-CM

## 2024-02-19 DIAGNOSIS — L299 Pruritus, unspecified: Secondary | ICD-10-CM

## 2024-02-19 DIAGNOSIS — L719 Rosacea, unspecified: Secondary | ICD-10-CM

## 2024-02-19 MED ORDER — HYDROXYZINE HCL 10 MG PO TABS: 10.0000 mg | ORAL_TABLET | Freq: Every evening | ORAL | 5 refills | Status: AC | PRN

## 2024-02-19 MED ORDER — METRONIDAZOLE 0.75 % EX GEL: 1.0000 | Freq: Two times a day (BID) | CUTANEOUS | 0 refills | Status: AC

## 2024-02-22 DIAGNOSIS — I10 Essential (primary) hypertension: Secondary | ICD-10-CM | POA: Diagnosis not present

## 2024-02-22 DIAGNOSIS — Z7409 Other reduced mobility: Secondary | ICD-10-CM | POA: Diagnosis not present

## 2024-02-22 DIAGNOSIS — M5412 Radiculopathy, cervical region: Secondary | ICD-10-CM | POA: Diagnosis not present

## 2024-02-22 DIAGNOSIS — M5416 Radiculopathy, lumbar region: Secondary | ICD-10-CM | POA: Diagnosis not present

## 2024-03-10 ENCOUNTER — Other Ambulatory Visit: Payer: Self-pay

## 2024-03-10 DIAGNOSIS — E785 Hyperlipidemia, unspecified: Secondary | ICD-10-CM

## 2024-03-10 DIAGNOSIS — K219 Gastro-esophageal reflux disease without esophagitis: Secondary | ICD-10-CM

## 2024-03-10 MED ORDER — AMLODIPINE BESYLATE 10 MG PO TABS: 10.0000 mg | ORAL_TABLET | Freq: Every day | ORAL | 3 refills | Status: AC

## 2024-03-10 MED ORDER — EZETIMIBE 10 MG PO TABS: 10.0000 mg | ORAL_TABLET | Freq: Every day | ORAL | 3 refills | Status: AC

## 2024-03-10 MED ORDER — PANTOPRAZOLE SODIUM 20 MG PO TBEC
20.0000 mg | DELAYED_RELEASE_TABLET | Freq: Every day | ORAL | 3 refills | Status: AC
Start: 2024-03-10 — End: ?

## 2024-03-10 NOTE — Telephone Encounter (Signed)
 Medication sent to pharmacy
